# Patient Record
Sex: Female | Born: 1935 | Race: White | Hispanic: No | Marital: Married | Smoking: Never smoker
Health system: Southern US, Community
[De-identification: ages and names within clinical notes are randomized; demographics above are authoritative.]

## PROBLEM LIST (undated history)

## (undated) DIAGNOSIS — E785 Hyperlipidemia, unspecified: Secondary | ICD-10-CM

## (undated) DIAGNOSIS — I639 Cerebral infarction, unspecified: Secondary | ICD-10-CM

## (undated) DIAGNOSIS — R600 Localized edema: Secondary | ICD-10-CM

## (undated) DIAGNOSIS — K7469 Other cirrhosis of liver: Secondary | ICD-10-CM

## (undated) DIAGNOSIS — S72009A Fracture of unspecified part of neck of unspecified femur, initial encounter for closed fracture: Secondary | ICD-10-CM

## (undated) DIAGNOSIS — I2510 Atherosclerotic heart disease of native coronary artery without angina pectoris: Secondary | ICD-10-CM

## (undated) DIAGNOSIS — I10 Essential (primary) hypertension: Secondary | ICD-10-CM

## (undated) DIAGNOSIS — C50919 Malignant neoplasm of unspecified site of unspecified female breast: Secondary | ICD-10-CM

## (undated) DIAGNOSIS — J90 Pleural effusion, not elsewhere classified: Secondary | ICD-10-CM

## (undated) DIAGNOSIS — K922 Gastrointestinal hemorrhage, unspecified: Secondary | ICD-10-CM

## (undated) DIAGNOSIS — J189 Pneumonia, unspecified organism: Secondary | ICD-10-CM

## (undated) DIAGNOSIS — K219 Gastro-esophageal reflux disease without esophagitis: Secondary | ICD-10-CM

## (undated) DIAGNOSIS — K56609 Unspecified intestinal obstruction, unspecified as to partial versus complete obstruction: Secondary | ICD-10-CM

## (undated) DIAGNOSIS — E46 Unspecified protein-calorie malnutrition: Secondary | ICD-10-CM

## (undated) DIAGNOSIS — Z9981 Dependence on supplemental oxygen: Secondary | ICD-10-CM

## (undated) DIAGNOSIS — R609 Edema, unspecified: Secondary | ICD-10-CM

## (undated) DIAGNOSIS — R410 Disorientation, unspecified: Secondary | ICD-10-CM

## (undated) HISTORY — PX: CARDIAC CATHETERIZATION: SHX172

## (undated) HISTORY — DX: Hyperlipidemia, unspecified: E78.5

## (undated) HISTORY — PX: ABDOMINAL HYSTERECTOMY: SHX81

## (undated) HISTORY — PX: MASTECTOMY: SHX3

## (undated) HISTORY — DX: Cerebral infarction, unspecified: I63.9

## (undated) HISTORY — DX: Atherosclerotic heart disease of native coronary artery without angina pectoris: I25.10

## (undated) HISTORY — DX: Fracture of unspecified part of neck of unspecified femur, initial encounter for closed fracture: S72.009A

## (undated) HISTORY — DX: Gastro-esophageal reflux disease without esophagitis: K21.9

## (undated) HISTORY — DX: Essential (primary) hypertension: I10

---

## 1997-12-27 ENCOUNTER — Other Ambulatory Visit: Admission: RE | Admit: 1997-12-27 | Discharge: 1997-12-27 | Payer: Self-pay | Admitting: Family Medicine

## 1998-08-25 ENCOUNTER — Other Ambulatory Visit: Admission: RE | Admit: 1998-08-25 | Discharge: 1998-08-25 | Payer: Self-pay | Admitting: Family Medicine

## 1999-03-10 ENCOUNTER — Ambulatory Visit (HOSPITAL_COMMUNITY): Admission: RE | Admit: 1999-03-10 | Discharge: 1999-03-10 | Payer: Self-pay | Admitting: *Deleted

## 2000-05-03 ENCOUNTER — Other Ambulatory Visit: Admission: RE | Admit: 2000-05-03 | Discharge: 2000-05-03 | Payer: Self-pay | Admitting: Family Medicine

## 2001-01-03 ENCOUNTER — Ambulatory Visit (HOSPITAL_COMMUNITY): Admission: RE | Admit: 2001-01-03 | Discharge: 2001-01-03 | Payer: Self-pay | Admitting: *Deleted

## 2001-01-05 ENCOUNTER — Encounter: Payer: Self-pay | Admitting: *Deleted

## 2001-01-05 ENCOUNTER — Ambulatory Visit (HOSPITAL_COMMUNITY): Admission: RE | Admit: 2001-01-05 | Discharge: 2001-01-05 | Payer: Self-pay | Admitting: *Deleted

## 2001-01-13 ENCOUNTER — Encounter: Payer: Self-pay | Admitting: *Deleted

## 2001-01-13 ENCOUNTER — Ambulatory Visit (HOSPITAL_COMMUNITY): Admission: RE | Admit: 2001-01-13 | Discharge: 2001-01-13 | Payer: Self-pay | Admitting: *Deleted

## 2001-10-02 ENCOUNTER — Other Ambulatory Visit: Admission: RE | Admit: 2001-10-02 | Discharge: 2001-10-02 | Payer: Self-pay | Admitting: Family Medicine

## 2002-10-09 ENCOUNTER — Other Ambulatory Visit: Admission: RE | Admit: 2002-10-09 | Discharge: 2002-10-09 | Payer: Self-pay | Admitting: Family Medicine

## 2003-02-27 ENCOUNTER — Other Ambulatory Visit: Admission: RE | Admit: 2003-02-27 | Discharge: 2003-02-27 | Payer: Self-pay | Admitting: Gynecology

## 2003-03-25 ENCOUNTER — Inpatient Hospital Stay (HOSPITAL_COMMUNITY): Admission: EM | Admit: 2003-03-25 | Discharge: 2003-03-27 | Payer: Self-pay | Admitting: Emergency Medicine

## 2003-03-25 ENCOUNTER — Encounter: Payer: Self-pay | Admitting: Emergency Medicine

## 2003-03-29 ENCOUNTER — Encounter: Admission: RE | Admit: 2003-03-29 | Discharge: 2003-03-29 | Payer: Self-pay | Admitting: Family Medicine

## 2005-02-05 ENCOUNTER — Other Ambulatory Visit: Admission: RE | Admit: 2005-02-05 | Discharge: 2005-02-05 | Payer: Self-pay | Admitting: Family Medicine

## 2008-07-08 ENCOUNTER — Emergency Department (HOSPITAL_COMMUNITY): Admission: EM | Admit: 2008-07-08 | Discharge: 2008-07-08 | Payer: Self-pay | Admitting: Emergency Medicine

## 2008-12-10 DIAGNOSIS — S72009A Fracture of unspecified part of neck of unspecified femur, initial encounter for closed fracture: Secondary | ICD-10-CM

## 2008-12-10 HISTORY — DX: Fracture of unspecified part of neck of unspecified femur, initial encounter for closed fracture: S72.009A

## 2008-12-29 ENCOUNTER — Inpatient Hospital Stay (HOSPITAL_COMMUNITY): Admission: EM | Admit: 2008-12-29 | Discharge: 2009-01-02 | Payer: Self-pay | Admitting: Emergency Medicine

## 2008-12-29 ENCOUNTER — Ambulatory Visit: Payer: Self-pay | Admitting: *Deleted

## 2009-11-03 ENCOUNTER — Encounter: Payer: Self-pay | Admitting: Cardiology

## 2009-11-04 DIAGNOSIS — R079 Chest pain, unspecified: Secondary | ICD-10-CM | POA: Insufficient documentation

## 2009-11-04 DIAGNOSIS — R609 Edema, unspecified: Secondary | ICD-10-CM | POA: Insufficient documentation

## 2009-11-04 DIAGNOSIS — R0602 Shortness of breath: Secondary | ICD-10-CM

## 2009-11-06 ENCOUNTER — Ambulatory Visit: Payer: Self-pay | Admitting: Cardiology

## 2009-11-06 DIAGNOSIS — E785 Hyperlipidemia, unspecified: Secondary | ICD-10-CM

## 2009-11-07 ENCOUNTER — Ambulatory Visit: Payer: Self-pay | Admitting: Cardiology

## 2009-11-07 ENCOUNTER — Inpatient Hospital Stay: Payer: Self-pay | Admitting: Internal Medicine

## 2009-11-07 LAB — CONVERTED CEMR LAB
BUN: 15 mg/dL (ref 6–23)
Calcium: 8.4 mg/dL (ref 8.4–10.5)
Glucose, Bld: 96 mg/dL (ref 70–99)
Pro B Natriuretic peptide (BNP): 68.4 pg/mL (ref 0.0–100.0)

## 2009-11-10 ENCOUNTER — Encounter: Payer: Self-pay | Admitting: Cardiovascular Disease

## 2009-11-11 ENCOUNTER — Telehealth (INDEPENDENT_AMBULATORY_CARE_PROVIDER_SITE_OTHER): Payer: Self-pay

## 2009-11-17 ENCOUNTER — Ambulatory Visit: Payer: Self-pay | Admitting: Cardiovascular Disease

## 2009-11-17 DIAGNOSIS — R42 Dizziness and giddiness: Secondary | ICD-10-CM

## 2009-11-26 ENCOUNTER — Telehealth: Payer: Self-pay | Admitting: Cardiovascular Disease

## 2009-12-15 ENCOUNTER — Ambulatory Visit: Payer: Self-pay | Admitting: Cardiovascular Disease

## 2010-08-13 NOTE — Progress Notes (Signed)
Summary: regarding Myoview cancellation  Phone Note Outgoing Call Call back at Home Phone [phone]   Call placed by: Irean Hong, RN,  Nov 11, 2009 1:59 PM Summary of Call: The patient's husband called me today to let us know that the patient had an echo and medication type stress test in [facility] recently. Mercedes Valeriano,RN.

## 2010-08-13 NOTE — Assessment & Plan Note (Signed)
Summary: EPH/AMD   Referring Provider:  Dr. [name] Primary Provider:  Dr. [name]  CC:  Post Hosp F/U ([name]); C/O dizziness.  History of Present Illness: 77 yo with history of CAD s/p PCI in 1999 with peri-cath CVA presents for followup after a recent admission to [facility] for chest pain. She had a stress test at that time which showed ischemia in the inferior and inferolateral wall. She refused cardiac catheterization and has been treated medically.  Over the past several months, her Lasix dose has been increased for worsening shortness of breath.  Today she states that she has worsening dizziness since she left the hospital. She has been taking Lasix twice a day, lisinopril and Imdur. Her shortness of breath is okay today though seems to wax and wane. She has tried inhalers in the past and these do not seem to work very well. she had additional episodes of chest pain several days ago that she described as a squeezing in her chest. She had a positive stress test and does not want a cardiac catheterization.  We talked to her about her cholesterol as her LDL is greater than 150 and given her coronary disease this Should be less than 170   She is not taking ASA as she says that she "cannot take it" because even a baby aspirin makes her "run up the wall."     Problems Prior to Update: 1)  Hyperlipidemia-mixed  (ICD-272.4) 2)  Edema  (ICD-782.3) 3)  Dyspnea  (ICD-786.05) 4)  Chest Pain-unspecified  (ICD-786.50)  Medications Prior to Update: 1)  Omeprazole 20 Mg Cpdr (Omeprazole) .... Take 1 Tablet By Mouth Once Daily 2)  Lisinopril 10 Mg Tabs (Lisinopril) .... Take One Tablet By Mouth Daily 3)  Furosemide 40 Mg Tabs (Furosemide) .... Take One Tablet By Mouth Daily. 4)  Isosorbide Mononitrate Cr 30 Mg Xr24h-Tab (Isosorbide Mononitrate) .... Take 1 Tablet By Mouth Once Daily 5)  Nitrostat 0.4 Mg Subl (Nitroglycerin) .... Take As Directed 6)  Singulair  10 Mg Tabs (Montelukast Sodium) .... Take 1 Tablet By Mouth Once Daily 7)  Plavix 75 Mg Tabs (Clopidogrel Bisulfate) .... Take 4 Tablets X 1day Then Start Taking 1 Tablet Daily 8)  Coreg 6.25 Mg Tabs (Carvedilol) .... Take 1 Tablet By Mouth Two Times A Day  Current Medications (verified): 1)  Omeprazole 20 Mg Cpdr (Omeprazole) .... Take 1 Tablet By Mouth Two Times A Day 2)  Lisinopril 10 Mg Tabs (Lisinopril) .... Take One Tablet By Mouth Daily 3)  Furosemide 40 Mg Tabs (Furosemide) .... Take One Tablet By Mouth Daily. 4)  Isosorbide Mononitrate Cr 30 Mg Xr24h-Tab (Isosorbide Mononitrate) .... Take 1 Tablet By Mouth Once Daily 5)  Nitrostat 0.4 Mg Subl (Nitroglycerin) .... Take As Directed 6)  Singulair 10 Mg Tabs (Montelukast Sodium) .... Take 1 Tablet By Mouth Once Daily 7)  Plavix 75 Mg Tabs (Clopidogrel Bisulfate) .... Take 4 Tablets X 1day Then Start Taking 1 Tablet Daily 8)  Metoprolol Succinate 25 Mg Xr24h-Tab (Metoprolol Succinate) .... Take One Tablet By Mouth Daily  Allergies: 1)  ! Codeine  Past History:  Past Medical History: Last updated: 11/06/2009 1. CAD: States she had cath with PCI at [facility] in 1999 but no record in [place].  2. CVA: Had CVA peri-cath in 1999 with right-sided hemiparesis, now resolved.  3. GERD 4. HTN 5. Right femoral neck fracture s/p right hemiarthroplasty in 6/10 6. Asthma 7. Hyperlipidemia: No currently taking a statin.  Past Surgical History: Last updated: 11-25-09 Stent  Family History: Last updated: 2009/11/25 Father:Deceased in year 12/10/63 Mother:Deceased in year 82 Son has had 2 MIs  Social History: Last updated: 11/25/2009 Retired, lives in [place] Married  Tobacco Use - never.  Alcohol Use - no Regular Exercise - no Drug Use - no Caffeine drinks 3 daily  Risk Factors: Alcohol Use: 0 (11-25-2009) Caffeine Use: yes (11-25-09) Diet: no (Nov 25, 2009) Exercise: no (11/25/09)  Risk Factors: Smoking Status: never  (11/25/09)  Review of Systems       The patient complains of weight loss.  The patient denies fever, weight gain, vision loss, decreased hearing, hoarseness, chest pain, syncope, dyspnea on exertion, peripheral edema, prolonged cough, abdominal pain, incontinence, muscle weakness, depression, and enlarged lymph nodes.         Dizzy  Vital Signs:  Patient profile:   77 year old female Height:      62 inches Weight:      155 pounds BMI:     28.45 Temp:     98.0 degrees F oral Pulse rate:   75 / minute BP sitting:   100 / 44  (left arm) Cuff size:   regular  Vitals Entered By: Stanton Kidney, EMT-P (Nov 17, 2009 1:45 PM)  Physical Exam  General:  well-appearing woman in no apparent distress, HEENT exam is benign, oropharynx is clear, no JVP or carotid bruits, heart sounds are regular with S1-S2 and no murmurs appreciated, lungs are abdominal exam is benign, no significant lower extremity edema, neurologic exam is grossly nonfocal, skin is warm and dry. Pulses are equal and symmetrical in her upper and lower extremities.    EKG  Procedure date:  11/17/2009  Findings:      normal sinus rhythm with rate 75 beats per minute, no significant ST or T wave changes.  Impression & Recommendations:  Problem # 1:  DYSPNEA (ICD-786.05) dyspnea is improved today. Dr. Shirlee Latch thought that she looked euvolemic when she was in the hospital and recommended Lasix on a daily basis. She was discharged on Lasix b.i.d. and now is dizzy with hypotension.  Repeat check of her blood pressure confirmed systolic in the 90s.  Her updated medication list for this problem includes:    Lisinopril 10 Mg Tabs (Lisinopril) ..... Hold    Furosemide 40 Mg Tabs (Furosemide) .Marland Kitchen... Take one tablet by mouth daily.    Metoprolol Succinate 25 Mg Xr24h-tab (Metoprolol succinate) .Marland Kitchen... Take one tablet by mouth daily  Problem # 2:  DIZZINESS (ICD-780.4) Etiology of her dizziness is likely due to hypotension. We will  hold her Lasix for 3 days and then restart this at once a day instead of b.i.d. We will hold her lisinopril as her systolic pressure is in the 90s. We'll continue her on the Imdur given her continued episodes of chest pain.  Problem # 3:  CHEST PAIN-UNSPECIFIED (ICD-786.50) we have talked to her about her chest pain and encouraged her to take a nitroglycerin sublingual when she gets discomfort. I have suggested to her that if her pain becomes increasingly worse and more frequent, that she contact us and we consider a cardiac catheterization. Her updated medication list for this problem includes:    Lisinopril 10 Mg Tabs (Lisinopril) ..... Hold    Isosorbide Mononitrate Cr 30 Mg Xr24h-tab (Isosorbide mononitrate) .Marland Kitchen... Take 1 tablet by mouth once daily    Nitrostat 0.4 Mg Subl (Nitroglycerin) .Marland Kitchen... Take as directed    Plavix 75 Mg Tabs (Clopidogrel  bisulfate) .Marland Kitchen... Take 4 tablets x 1day then start taking 1 tablet daily    Metoprolol Succinate 25 Mg Xr24h-tab (Metoprolol succinate) .Marland Kitchen... Take one tablet by mouth daily  Problem # 4:  HYPERLIPIDEMIA-MIXED (ICD-272.4) her cholesterol is poorly controlled and I will start her on Crestor 5 mg daily. I've asked her to contact me if she is unable to tolerate it.  Her updated medication list for this problem includes:    Crestor 5 Mg Tabs (Rosuvastatin calcium) .Marland Kitchen... Take one tablet by mouth daily.  Patient Instructions: 1)  Your physician recommends that you schedule a follow-up appointment in: 1 month 2)  Your physician has recommended you make the following change in your medication: Stop your Lasix until Friday, then restart taking once daily.  Stop your Lisinopril.  Start taking Crestor 5mg  daily (1/2 10mg  tablet). 3)  Your physician has requested that you regularly monitor and record your blood pressure readings at home.  Please use the same machine at the same time of day to check your readings and record them. Call us in 1 week with your  readings.

## 2010-08-13 NOTE — Progress Notes (Signed)
Summary: PHI  PHI   Imported By: Harlon Flor 11/07/2009 11:34:23  _____________________________________________________________________  External Attachment:    Type:   Image     Comment:   External Document

## 2010-08-13 NOTE — Assessment & Plan Note (Signed)
Summary: NP6/AMD   Visit Type:  Initial Consult Referring Provider:  Dr. [name] Primary Provider:  Dr. [name]  CC:  CHF, CP, edema, SOB, cholesterol uncontrolable, and headache x 2wks.  History of Present Illness: 77 yo with history of CAD s/p PCI in 1999 with peri-cath CVA presents for evaluation of dyspnea and chest pain.  Patient has had some dyspnea for the last year.  She has attributed this to asthma.  However, for the last 2 months, this dyspnea has worsened.  She is now short of breath doing the laundary or just walking around her house.  She has 2 pillow orthopnea and occasional symptoms that sound like PND.  On Saturday, after eating a fried fish dinner, she developed a squeezing chest pain that did not feel like her past GERD.  She took NTG but this did not help much.  The pain lasted about 6 hours total.  Since then, she has been getting chest pressure and squeezing that seems to be happening mostly with exertion, such as walking around her house.  She has been taking about 2 nitroglycerines a day since Saturday.  She saw Dr. Clarene Duke on Monday who was concerned for CHF.  Her Lasix was increased from 10 mg daily to 40 mg two times a day.  She has been urinating frequently and says that her shortness of breath is improving.  She is not taking ASA as she says that she "cannot take it" because even a baby aspirin makes her "run up the wall."    ECG: NSR, nonspecific slight ST depression inferiorly and anteriolaterally, no Qs.   Preventive Screening-Counseling & Management  Alcohol-Tobacco     Alcohol drinks/day: 0     Smoking Status: never  Caffeine-Diet-Exercise     Caffeine use/day: yes     Caffeine Counseling: 3 times daily     Diet Comments: no     Does Patient Exercise: no      Drug Use:  no.    Current Medications (verified): 1)  Omeprazole 20 Mg Cpdr (Omeprazole) .... Take 1 Tablet By Mouth Once Daily 2)  Lisinopril 10 Mg Tabs (Lisinopril) .... Take One Tablet  By Mouth Daily 3)  Furosemide 40 Mg Tabs (Furosemide) .... Take One Tablet By Mouth Daily. 4)  Isosorbide Mononitrate Cr 30 Mg Xr24h-Tab (Isosorbide Mononitrate) .... Take 1 Tablet By Mouth Once Daily 5)  Nitrostat 0.4 Mg Subl (Nitroglycerin) .... Take As Directed 6)  Singulair 10 Mg Tabs (Montelukast Sodium) .... Take 1 Tablet By Mouth Once Daily  Allergies (verified): 1)  ! Codeine  Past History:  Past Medical History: 1. CAD: States she had cath with PCI at [facility] in 1999 but no record in [place].  2. CVA: Had CVA peri-cath in 1999 with right-sided hemiparesis, now resolved.  3. GERD 4. HTN 5. Right femoral neck fracture s/p right hemiarthroplasty in 6/10 6. Asthma 7. Hyperlipidemia: No currently taking a statin.   Past Surgical History: Stent  Family History: Father:Deceased in year 37 Mother:Deceased in year 48 Son has had 2 MIs  Social History: Retired, lives in [place] Married  Tobacco Use - never.  Alcohol Use - no Regular Exercise - no Drug Use - no Caffeine drinks 3 dailySmoking Status:  never Does Patient Exercise:  no Drug Use:  no Alcohol drinks/day:  0 Caffeine use/day:  yes Diet Comments:  no  Review of Systems       All systems reviewed and negative except as per HPI.  Vital Signs:  Patient profile:   77 year old female Height:      62 inches Weight:      156 pounds BMI:     28.64 Pulse rate:   94 / minute BP sitting:   138 / 62  (right arm) Cuff size:   large CC: CHF, CP, edema, SOB, cholesterol uncontrolable, headache x 2wks   Physical Exam  General:  Well developed, well nourished, in no acute distress. Obese.  Head:  normocephalic and atraumatic Nose:  no deformity, discharge, inflammation, or lesions Mouth:  Teeth, gums and palate normal. Oral mucosa normal. Neck:  Neck supple, JVP 8 cm. No masses, thyromegaly or abnormal cervical nodes. Lungs:  Crackles 1/3 up lung Mehlman bilaterally.  Heart:  Non-displaced PMI, chest  non-tender; regular rate and rhythm, S1, S2 without rubs or gallops. 2/6 systolic crescendo-decrescend murmur RUSB.   Carotid upstroke normal, no bruit. 1+ ankle edema.  Abdomen:  Bowel sounds positive; abdomen soft and non-tender without masses, organomegaly, or hernias noted. No hepatosplenomegaly. Msk:  Back normal, normal gait. Muscle strength and tone normal. Extremities:  No clubbing or cyanosis. Neurologic:  Alert and oriented x 3. Skin:  Intact without lesions or rashes. Psych:  Normal affect.   Impression & Recommendations:  Problem # 1:  DYSPNEA (ICD-786.05) Patient has had the gradual progression of exertional dyspnea.  She does appear mildly volume overloaded on exam.  She says that her symptoms have improved since Lasix was increased on Monday.  I suspect that she has CHF.  Will get CXR today and labs (BMET, BNP).  She may need the addition of KCl while on the Lasix (will f/u BMET).  Echocardiogram on Monday.   Problem # 2:  CHEST PAIN-UNSPECIFIED (ICD-786.50) Patient had a prolonged episode of chest discomfort after eating fried fish on Saturday.  However, since that time, she has had several episodes of exertional chest pain for which she has been taking NTG.  This does sound worrisome for progressive CAD.  I talked to her about catheterization, which she is very reluctant to undergo given her prior peri-cath CVA.  We will therefore start out with a Lexiscan myoview which should be done Monday.  If there is a large area of ischemia, I will likely recommend that she have a catheterization.  She says that she cannot tolerate aspirin.  I will have her start Plavix. She will continue Imdur and start on Coreg 6.25 mg two times a day.    Problem # 3:  HYPERLIPIDEMIA-MIXED (ICD-272.4) I will check fasting lipids.  She needs to be on a statin, the question will be what dose.  She is not on one currently but does not remember any past side effects.    Other Orders: T-2 View CXR  (71020TC) T-Basic Metabolic Panel [phone]) T-BNP  (B Natriuretic Peptide) [phone]) Echocardiogram (Echo) Nuclear Stress Test (Nuc Stress Test)  Patient Instructions: 1)  Your physician recommends that you schedule a follow-up appointment in: 1 week 2)  Your physician recommends that you return for a FASTING lipid profile: on Monday at [facility] office. 3)  Your physician has recommended you make the following change in your medication: Start taking Plavix 75mg , take 300mg  x 1 dose then start taking 75mg  daily. Start taking Coreg 6.25mg  two times a day.   4)  Your physician has requested that you have an [employer]  For further information please visit [email or web address]  Please follow instruction sheet, as given. 5)  Your physician  has requested that you have an echocardiogram.  Echocardiography is a painless test that uses sound waves to create images of your heart. It provides your doctor with information about the size and shape of your heart and how well your heart's chambers and valves are working.  This procedure takes approximately one hour. There are no restrictions for this procedure. Prescriptions: COREG 6.25 MG TABS (CARVEDILOL) Take 1 tablet by mouth two times a day  #60 x 6   Entered by:   Cloyde Reams RN   Authorized by:   Marca Ancona, MD   Signed by:   Cloyde Reams RN on 11/06/2009   Method used:   Electronically to        [facility]* (retail)       [address] [place], [place]  [address]       Ph: [phone] or [phone]       Fax: [phone]   RxID:   [id number] PLAVIX 75 MG TABS (CLOPIDOGREL BISULFATE) Take 4 tablets x 1day then start taking 1 tablet daily  #34 x 0   Entered by:   Cloyde Reams RN   Authorized by:   Marca Ancona, MD   Signed by:   Cloyde Reams RN on 11/06/2009   Method used:   Electronically to        [facility]* (retail)       [address] [place], [place]  [address]       Ph: [phone] or [phone]       Fax: [phone]   RxID:   [id number]

## 2010-08-13 NOTE — Assessment & Plan Note (Signed)
Summary: F1M/GLC   Visit Type:  Follow-up Referring Provider:  Dr. [name] Primary Provider:  Dr. [name]  CC:  chest pain;relieved with (2)NTG...dizziness.  History of Present Illness: 77 yo with history of CAD s/p PCI in Nov 22, 1997 with peri-cath CVA presents for followup after a recent admission to [facility] for chest pain. She had a stress test at that time which showed ischemia in the inferior and inferolateral wall. She refused cardiac catheterization and has been treated medically.  Over the past several months, her Lasix dose has been increased for worsening shortness of breath.She states that she feels better on Lasix 40 mg daily. She has no significant edema. She continues to have chest discomfort and upper back discomfort in the evenings when she goes to bed. She would like a sleeping pill as she reports Ambien and Xanax worked well in the hospital.she is uncertain if her discomfort is due to heartburn. She was prescribed to omeprazole daily though she is only taking one.  Blood pressure numbers taken at home show systolic pressures in the 120s, heart rates in the 60s. Repeat blood pressure check in the office shows 135/60.     She had a positive stress test and does not want a cardiac catheterization.   She is not taking ASA as she says that she "cannot take it" because even a baby aspirin makes her "run up the wall."     Current Medications (verified): 1)  Omeprazole 20 Mg Cpdr (Omeprazole) .... Take 1 Tablet By Mouth Two Times A Day 2)  Furosemide 40 Mg Tabs (Furosemide) .... Take One Tablet By Mouth Daily. 3)  Nitrostat 0.4 Mg Subl (Nitroglycerin) .... Take As Directed 4)  Singulair 10 Mg Tabs (Montelukast Sodium) .... Take 1 Tablet By Mouth Once Daily 5)  Plavix 75 Mg Tabs (Clopidogrel Bisulfate) .... Take 4 Tablets X 1day Then Start Taking 1 Tablet Daily  Allergies (verified): 1)  ! Codeine  Past History:  Past Medical History: Last  updated: 2009/11/08 1. CAD: States she had cath with PCI at [facility] in 1997/11/22 but no record in [place].  2. CVA: Had CVA peri-cath in Nov 22, 1997 with right-sided hemiparesis, now resolved.  3. GERD 4. HTN 5. Right femoral neck fracture s/p right hemiarthroplasty in 6/10 6. Asthma 7. Hyperlipidemia: No currently taking a statin.   Past Surgical History: Last updated: November 08, 2009 Stent  Family History: Last updated: 2009/11/08 Father:Deceased in year 23-Nov-1963 Mother:Deceased in year 68 Son has had 2 MIs  Social History: Last updated: 08-Nov-2009 Retired, lives in [place] Married  Tobacco Use - never.  Alcohol Use - no Regular Exercise - no Drug Use - no Caffeine drinks 3 daily  Risk Factors: Alcohol Use: 0 (11/08/09) Caffeine Use: yes (11-08-09) Diet: no (November 08, 2009) Exercise: no (11-08-2009)  Risk Factors: Smoking Status: never (2009/11/08)  Review of Systems       The patient complains of chest pain and dyspnea on exertion.  The patient denies fever, weight loss, weight gain, vision loss, decreased hearing, hoarseness, syncope, peripheral edema, prolonged cough, abdominal pain, incontinence, muscle weakness, depression, and enlarged lymph nodes.    Vital Signs:  Patient profile:   77 year old female Height:      62 inches Weight:      154 pounds BMI:     28.27 Pulse rate:   92 / minute BP sitting:   174 / 75  (left arm) Cuff size:   regular  Vitals Entered By: Bishop Dublin,  CMA (December 15, 2009 10:48 AM)  Physical Exam  General:  Well developed, well nourished, in no acute distress. Head:  normocephalic and atraumatic Neck:  Neck supple, no JVD. No masses, thyromegaly or abnormal cervical nodes. Lungs:  scattered rhonchi bilaterally at the bases. Heart:  Non-displaced PMI, chest non-tender; regular rate and rhythm, S1, S2 without murmurs, rubs or gallops. Carotid upstroke normal, no bruit.. Pedals normal pulses. No edema, no varicosities. Abdomen:  Bowel sounds  positive; abdomen soft and non-tender without masses,  Msk:  Back normal, normal gait. Muscle strength and tone normal. Pulses:  pulses normal in all 4 extremities Extremities:  No clubbing or cyanosis. Neurologic:  Alert and oriented x 3. Skin:  Intact without lesions or rashes. Psych:  Normal affect.    EKG  Procedure date:  12/15/2009  Findings:      normal sinus rhythm with rate 92 beats per minute, nonspecific ST changes in anterolateral leads, inferior leads.  Impression & Recommendations:  Problem # 1:  DIZZINESS (ICD-780.4) her dizziness has improved. She ran out of Imdur and metoprolol and has been off these for one week. We will restart the metoprolol and continue to hold the Imdur as her blood pressure numbers appear to be well controlled at home.  Problem # 2:  HYPERLIPIDEMIA-MIXED (ICD-272.4) she could not tolerate Crestor and has given [place] the samples back. She does not want to try any more statins.  The following medications were removed from the medication list:    Crestor 5 Mg Tabs (Rosuvastatin calcium) .Marland Kitchen... Take one tablet by mouth daily.  Problem # 3:  EDEMA (ICD-782.3) Edema and shortness of breath appears to be relatively well controlled on her current dose of Lasix 40 mg daily.  Problem # 4:  CHEST PAIN-UNSPECIFIED (ICD-786.50) chest pain is being managed by sublingual nitros. It seems to happen at night time, is somewhat atypical. Her back and chest discomfort may also be due to some shortness of breath, asthma, arthritis. We will have her continue to take sublingual nitroglycerin for now. We will hold the long-acting nitroglycerin as her blood pressure has been low on the medication and she's also had dizziness on the medication.  The following medications were removed from the medication list:    Lisinopril 10 Mg Tabs (Lisinopril) ..... Hold    Isosorbide Mononitrate Cr 30 Mg Xr24h-tab (Isosorbide mononitrate) .Marland Kitchen... Take 1 tablet by mouth once  daily Her updated medication list for this problem includes:    Nitrostat 0.4 Mg Subl (Nitroglycerin) .Marland Kitchen... Take as directed    Plavix 75 Mg Tabs (Clopidogrel bisulfate) .Marland Kitchen... Take 1 tablet by mouth once a day    Metoprolol Succinate 25 Mg Xr24h-tab (Metoprolol succinate) .Marland Kitchen... Take one tablet by mouth daily  Patient Instructions: 1)  Your physician has recommended you make the following change in your medication: Hold imdur refills sent to your pharmacy  2)  Your physician wants you to follow-up in:   6 months You will receive a reminder letter in the mail two months in advance. If you don't receive a letter, please call our office to schedule the follow-up appointment. Prescriptions: FUROSEMIDE 40 MG TABS (FUROSEMIDE) Take one tablet by mouth daily.  #90 x 4   Entered by:   Benedict Needy, RN   Authorized by:   Dossie Arbour MD   Signed by:   Benedict Needy, RN on 12/15/2009   Method used:   Electronically to        [facility]* (retail)       [address] [place], [place]  [address]       Ph: [phone] or [phone]       Fax: [phone]   RxID:   [id number] PLAVIX 75 MG TABS (CLOPIDOGREL BISULFATE) Take 1 tablet by mouth once a day  #90 x 4   Entered by:   Benedict Needy, RN   Authorized by:   Dossie Arbour MD   Signed by:   Benedict Needy, RN on 12/15/2009   Method used:   Electronically to        [facility]* (retail)       [address] [place], [place]  [address]       Ph: [phone] or [phone]       Fax: [phone]   RxID:   [id number] METOPROLOL SUCCINATE 25 MG XR24H-TAB (METOPROLOL SUCCINATE) Take one tablet by mouth daily  #90 x 4   Entered by:   Benedict Needy, RN   Authorized by:   Dossie Arbour MD   Signed by:   Benedict Needy, RN on 12/15/2009   Method used:   Electronically to        [facility]*  (retail)       [address] [place], [place]  [address]       Ph: [phone] or [phone]       Fax: [phone]   RxID:   [id number]

## 2010-08-13 NOTE — Progress Notes (Signed)
Summary: BP READINGS  Phone Note Call from Patient Call back at Home Phone [phone]   Caller: SELF Call For: [facility] Summary of Call: BP READINGS: 5/10 WAS 116/93, 5/11 WAS 122/60, 5/12 WAS 138/65, 5/13 WAS 125/58, 5/14 WAS 122/57, 5/15 WAS 125/63, 5/16 WAS 122/65.  THESE WERE TAKEN BETWEEN 7 AND 8:30 PM.  PT HAS DISCONTIUED THE CRESTOR BECAUSE SHE WAS UNABLE TO GET OUT OF THE BED WHILE TAKING IT. Initial call taken by: Harlon Flor,  Nov 26, 2009 9:38 AM  Follow-up for Phone Call        Called spoke with pt advised BP's look very good, continue on same medications.  Pt states she tried the Crestor again, and "couldn't get out of bed", legs ached, body aches.  Pt states she has tried taking in the past with the same intolerance.   Follow-up by: Cloyde Reams RN,  Nov 26, 2009 10:30 AM

## 2010-08-13 NOTE — Consult Note (Signed)
[facility]   Imported By: Harlon Flor 11/07/2009 11:20:38  _____________________________________________________________________  External Attachment:    Type:   Image     Comment:   External Document

## 2010-08-13 NOTE — Letter (Signed)
[facility]   Imported By: Harlon Flor 11/13/2009 11:08:03  _____________________________________________________________________  External Attachment:    Type:   Image     Comment:   External Document

## 2010-09-10 DIAGNOSIS — K922 Gastrointestinal hemorrhage, unspecified: Secondary | ICD-10-CM

## 2010-09-10 HISTORY — DX: Gastrointestinal hemorrhage, unspecified: K92.2

## 2010-09-16 ENCOUNTER — Inpatient Hospital Stay (HOSPITAL_COMMUNITY)
Admission: EM | Admit: 2010-09-16 | Discharge: 2010-09-18 | DRG: 378 | Disposition: A | Discharge status: Home / Self Care | Payer: Medicare Other | Attending: Family Medicine | Admitting: Family Medicine

## 2010-09-16 ENCOUNTER — Other Ambulatory Visit: Payer: Self-pay | Admitting: Gastroenterology

## 2010-09-16 ENCOUNTER — Emergency Department (HOSPITAL_COMMUNITY): Payer: Medicare Other

## 2010-09-16 DIAGNOSIS — R0789 Other chest pain: Secondary | ICD-10-CM | POA: Diagnosis present

## 2010-09-16 DIAGNOSIS — I69959 Hemiplegia and hemiparesis following unspecified cerebrovascular disease affecting unspecified side: Secondary | ICD-10-CM

## 2010-09-16 DIAGNOSIS — K922 Gastrointestinal hemorrhage, unspecified: Principal | ICD-10-CM | POA: Diagnosis present

## 2010-09-16 DIAGNOSIS — D62 Acute posthemorrhagic anemia: Secondary | ICD-10-CM

## 2010-09-16 DIAGNOSIS — D1330 Benign neoplasm of unspecified part of small intestine: Secondary | ICD-10-CM | POA: Diagnosis present

## 2010-09-16 DIAGNOSIS — I2510 Atherosclerotic heart disease of native coronary artery without angina pectoris: Secondary | ICD-10-CM | POA: Diagnosis present

## 2010-09-16 DIAGNOSIS — K219 Gastro-esophageal reflux disease without esophagitis: Secondary | ICD-10-CM | POA: Diagnosis present

## 2010-09-16 DIAGNOSIS — K921 Melena: Secondary | ICD-10-CM

## 2010-09-16 DIAGNOSIS — K222 Esophageal obstruction: Secondary | ICD-10-CM | POA: Diagnosis present

## 2010-09-16 LAB — CBC
Hemoglobin: 6.3 g/dL — CL (ref 12.0–15.0)
MCHC: 31.3 g/dL (ref 30.0–36.0)
MCV: 67.5 fL — ABNORMAL LOW (ref 78.0–100.0)
Platelets: 159 10*3/uL (ref 150–400)
RBC: 3.18 MIL/uL — ABNORMAL LOW (ref 3.87–5.11)
RDW: 20.3 % — ABNORMAL HIGH (ref 11.5–15.5)
WBC: 9.4 10*3/uL (ref 4.0–10.5)
WBC: 6 10*3/uL (ref 4.0–10.5)

## 2010-09-16 LAB — POCT I-STAT, CHEM 8
BUN: 28 mg/dL — ABNORMAL HIGH (ref 6–23)
Calcium, Ion: 1.09 mmol/L — ABNORMAL LOW (ref 1.12–1.32)
HCT: 20 % — ABNORMAL LOW (ref 36.0–46.0)
TCO2: 20 mmol/L (ref 0–100)

## 2010-09-16 LAB — OCCULT BLOOD, POC DEVICE: Fecal Occult Bld: POSITIVE

## 2010-09-16 LAB — MRSA PCR SCREENING: MRSA by PCR: NEGATIVE

## 2010-09-16 LAB — GLUCOSE, CAPILLARY: Glucose-Capillary: 127 mg/dL — ABNORMAL HIGH (ref 70–99)

## 2010-09-16 LAB — DIFFERENTIAL
Basophils Absolute: 0.1 10*3/uL (ref 0.0–0.1)
Basophils Relative: 1 % (ref 0–1)
Neutro Abs: 7.1 10*3/uL (ref 1.7–7.7)
Neutrophils Relative %: 76 % (ref 43–77)

## 2010-09-16 LAB — PROTIME-INR
INR: 1.37 (ref 0.00–1.49)
Prothrombin Time: 17.1 seconds — ABNORMAL HIGH (ref 11.6–15.2)

## 2010-09-16 LAB — PREPARE RBC (CROSSMATCH)

## 2010-09-16 LAB — POCT CARDIAC MARKERS: Troponin i, poc: <0.05 ng/mL (ref 0.00–0.09)

## 2010-09-17 DIAGNOSIS — D1330 Benign neoplasm of unspecified part of small intestine: Secondary | ICD-10-CM

## 2010-09-17 DIAGNOSIS — K921 Melena: Secondary | ICD-10-CM

## 2010-09-17 LAB — CROSSMATCH
ABO/RH(D): A POS
Antibody Screen: NEGATIVE
Unit division: 0

## 2010-09-17 LAB — CARDIAC PANEL(CRET KIN+CKTOT+MB+TROPI)
CK, MB: 1.3 ng/mL (ref 0.3–4.0)
Relative Index: INVALID (ref 0.0–2.5)
Total CK: 46 U/L (ref 7–177)

## 2010-09-17 LAB — BASIC METABOLIC PANEL
CO2: 27 mEq/L (ref 19–32)
Calcium: 7.7 mg/dL — ABNORMAL LOW (ref 8.4–10.5)
Chloride: 105 mEq/L (ref 96–112)
GFR calc Af Amer: >60 mL/min (ref >60)
Glucose, Bld: 124 mg/dL — ABNORMAL HIGH (ref 70–99)
Potassium: 3.2 mEq/L — ABNORMAL LOW (ref 3.5–5.1)
Sodium: 138 mEq/L (ref 135–145)

## 2010-09-17 LAB — CBC
HCT: 30.7 % — ABNORMAL LOW (ref 36.0–46.0)
HCT: 31.9 % — ABNORMAL LOW (ref 36.0–46.0)
Hemoglobin: 10.6 g/dL — ABNORMAL LOW (ref 12.0–15.0)
MCH: 23.2 pg — ABNORMAL LOW (ref 26.0–34.0)
MCHC: 33.2 g/dL (ref 30.0–36.0)
MCV: 70 fL — ABNORMAL LOW (ref 78.0–100.0)
MCV: 70.3 fL — ABNORMAL LOW (ref 78.0–100.0)
Platelets: 151 10*3/uL (ref 150–400)
Platelets: 166 10*3/uL (ref 150–400)
Platelets: 178 10*3/uL (ref 150–400)
RBC: 4.35 MIL/uL (ref 3.87–5.11)
RBC: 4.56 MIL/uL (ref 3.87–5.11)
RDW: 20.8 % — ABNORMAL HIGH (ref 11.5–15.5)
RDW: 21 % — ABNORMAL HIGH (ref 11.5–15.5)
RDW: 21.1 % — ABNORMAL HIGH (ref 11.5–15.5)
WBC: 5.7 10*3/uL (ref 4.0–10.5)
WBC: 6 10*3/uL (ref 4.0–10.5)
WBC: 7.1 10*3/uL (ref 4.0–10.5)

## 2010-09-17 LAB — TSH: TSH: 2.672 u[IU]/mL (ref 0.350–4.500)

## 2010-09-18 LAB — CBC
HCT: 31.6 % — ABNORMAL LOW (ref 36.0–46.0)
Hemoglobin: 10.2 g/dL — ABNORMAL LOW (ref 12.0–15.0)
MCHC: 32.3 g/dL (ref 30.0–36.0)
RDW: 22.1 % — ABNORMAL HIGH (ref 11.5–15.5)
WBC: 5.2 10*3/uL (ref 4.0–10.5)

## 2010-09-18 LAB — CARDIAC PANEL(CRET KIN+CKTOT+MB+TROPI)
Relative Index: INVALID (ref 0.0–2.5)
Total CK: 37 U/L (ref 7–177)
Troponin I: 0.02 ng/mL (ref 0.00–0.06)

## 2010-09-21 NOTE — Discharge Summary (Signed)
NAMETINIA, ORAVEC                ACCOUNT NO.:  [id number]  MEDICAL RECORD NO.:  [id number]           PATIENT TYPE:  I  LOCATION:  2010                         FACILITY:  [facility]  PHYSICIAN:  Brendia Sacks, MD    DATE OF BIRTH:  1929-03-06  DATE OF ADMISSION:  09/16/2010 DATE OF DISCHARGE:  09/18/2010                              DISCHARGE SUMMARY   PRIMARY CARE PHYSICIAN:  Dr. Clarene Duke at [facility].  PRIMARY CARDIOLOGIST:  Antonieta Iba, MD  CONDITION ON DISCHARGE:  Improved.  DISCHARGE DIAGNOSES: 1. Upper gastrointestinal bleed secondary to sessile polyp in the     duodenal bulb. 2. Status post band ligation of the said polyp. 3. Stricture at the gastroesophageal junction. 4. Acute blood loss anemia, status post 4 units of packed red blood     cells. 5. Noncardiac chest pain, resolved. 6. History of coronary artery disease, stable. 7. History of cerebrovascular accident, stable. 8. Gastroesophageal reflux disease, stable.  HISTORY OF PRESENT ILLNESS:  This is a 77 year old woman who presented to the emergency room with chest pain and was found to be profoundly anemic.  She is admitted for transfusion and gastroenterology evaluation.  HOSPITAL COURSE: 1. Upper GI bleed:  The patient was seen in consultation with     Gastroenterology.  She was taken to the Endoscopy Suite and     underwent EGD with results as described below.  Since her     procedure, she has had no further bleeding.  She is status post 4     units of packed red blood cells with adequate response.  Her     hemoglobin has remained stable.  She feels quite well to go home     and has had no further bleeding.  She has been cleared from a GI     standpoint and will go home on iron.  Per GI, Plavix can be resumed     on September 25, 2010, as her hemoglobin is stable and she has had no     further bleeding.  The patient will be following up with her     primary care physician 2 days prior to this. 2.  Acute blood loss anemia secondary to above. 3. Noncardiac chest pain.  This resolved and was secondary to the     patient's severe anemia. 4. History of coronary artery disease.  This appears to be stable.     The patient's chest pain was related to her anemia and not cardiac     in nature per se.  She has declined catheterization in the past, we     will continue on medical management.  I do not see that she is on     any beta-blocker therapy or aspirin therapy or statin therapy.  She     is followed by her cardiologist and therefore at this point I would     make no changes, but would defer to Cardiology's recommendations.     In regard to her Plavix, this is on hold secondary to her GI bleed. 5. History of CVA.  This appears to  be stable and she can resume her     Plavix as described above. 6. Reflux:  This has remained stable on PPI therapy.  CONSULTATIONS:  Gastroenterology.  RECOMMENDATIONS:  As above.  PROCEDURES:  Upper endoscopy on September 16, 2010:  Sessile polyp in the duodenal bulb, intermittently actively bleeding.  Status post band ligation.  Stricture at the gastroesophageal junction.  Polyps multiple in the fundus.  PATHOLOGY:  Duodenum biopsy notable for hyperplastic-type polyp, no evidence of malignancy.  IMAGING:  Chest x-ray on September 16, 2010:  No active disease.  PERTINENT LABORATORY STUDIES: 1. Hemoglobin on admission was 6.3, on discharge 10.2 and stable.     White blood cell count and platelet count within normal limits. 2. Cardiac enzymes negative. 3. Basic metabolic panel essentially unremarkable. 4. TSH within normal limits.  PHYSICAL EXAMINATION ON DISCHARGE:  GENERAL:  The patient is feeling well.  She has had no bleeding.  She is ready to go home. VITAL SIGNS:  Afebrile, temperature is 97.7.  Pulse listed as 43, however, this is not consistent with my examination with normal heart rate approximately in the 70s.  Respirations 18.  Blood pressure  126/60. Sat 93% on room air. CARDIOVASCULAR:  Regular rate and rhythm.  No murmur, rub, or gallop. RESPIRATORY:  Clear to auscultation bilaterally.  No wheezes, rales, or rhonchi.  Normal respiratory effort. ABDOMEN:  Soft, nontender, and nondistended.  DISCHARGE INSTRUCTIONS:  The patient will be discharged home today.  I have discussed discharge instructions with her family including her sister, brother-in-law, and husband and they voiced understanding.  ACTIVITIES:  Unrestricted.  DIET:  Unrestricted.  FOLLOWUP:  She should follow up with Dr. Clarene Duke at her already scheduled appointment on September 23, 2010, and have a CBC checked at that time.  If her hemoglobin remains stable, then Gastroenterology has recommended restarting her Plavix on September 25, 2010.  We would defer reinitiation of this to her primary care physician.  DISCHARGE MEDICATIONS: 1. Tylenol 325 mg 2 tablets p.o. every 4 hours as needed for pain. 2. Iron 325 mg p.o. daily. 3. Amlodipine 5 mg p.o. daily. 4. Melatonin over-the-counter p.o. nightly. 5. Nitroglycerin sublingual 0.4 mg p.r.n. chest pain every 5 minutes     as needed up to 3 doses.  Call 911 if no relief. 6. Omeprazole 20 mg p.o. daily. 7. Singular 10 mg p.o. daily.  Discontinue the following medications: 1. Plavix. 2. Prednisone.  I have also discussed with the family absolutely no NSAIDS at this point.  TIME COORDINATING DISCHARGE:  15 minutes.     Brendia Sacks, MD     DG/MEDQ  D:  09/18/2010  T:  09/19/2010  Job:  [id number]  cc:   Dr. San Jetty, MD  Electronically Signed by Brendia Sacks  on 09/21/2010 08:33:19 PM

## 2010-09-22 NOTE — Procedures (Addendum)
Summary: Upper Endoscopy  Patient: [name] Note: All result statuses are Final unless otherwise noted.  Tests: (1) Upper Endoscopy (EGD)   EGD Upper Endoscopy       DONE     [facility]     [address]     [place], [place]  [address]          ENDOSCOPY PROCEDURE REPORT          PATIENT:  [name], [name]  MR#:  [id number]     BIRTHDATE:  07-22-1928, 81 yrs. old  GENDER:  female          ENDOSCOPIST:  Barbette Hair. Arlyce Dice, MD     Referred by:          PROCEDURE DATE:  09/16/2010     PROCEDURE:  EGD with biopsy, 43239, EGD for control of bleeding     ASA CLASS:  Class III     INDICATIONS:  melena          MEDICATIONS:   Fentanyl 100.9 mcg, Versed 3 mg, glycopyrrolate     (Robinal) 0.2 mg IV     TOPICAL ANESTHETIC:          DESCRIPTION OF PROCEDURE:   After the risks benefits and     alternatives of the procedure were thoroughly explained, informed     consent was obtained.  The [id number] ([id number]) and [id number]     ([id number]) endoscope was introduced through the mouth and advanced     to the third portion of the duodenum, without limitations.  The     instrument was slowly withdrawn as the mucosa was fully examined.     <<PROCEDUREIMAGES>>          A sessile polyp was found in the duodenal bulb portion of the     duodenum. Intermittently actively bleeding friable polyp at apex     of bulb. Area examined with gastroscope and duodenoscope. After     single biopsy the polyp developed more active bleeding. Bleeding     stopped after application of a single band ligator (see image003,     image012, image013, and image016).  A stricture was found at the     gastroesophageal junction (see image001 and image009).  There were     multiple polyps identified. in the fundus (see image005 and     image006). Multiple benign appearing fundic polyps    Retroflexion     was not performed.  The scope was then withdrawn from the patient     and the procedure  completed.          COMPLICATIONS:  None          ENDOSCOPIC IMPRESSION:     1) Sessile polyp in the duodenal bulb - intermittently actively     bleeding; s/p band ligation     2) Stricture at the gastroesophageal junction     3) Polyps, multiple in the fundus     RECOMMENDATIONS:     1) Await biopsy results     2) continue PPI          REPEAT EXAM:  No          ______________________________     Barbette Hair. Arlyce Dice, MD          CC:          n.     eSIGNED:   Barbette Hair. Marcia Lepera at 09/16/2010 12:21 PM  [name], [name], [phone]  Note: An exclamation mark (!) indicates a result that was not dispersed into the flowsheet. Document Creation Date: 09/16/2010 12:22 PM _______________________________________________________________________  (1) Order result status: Final Collection or observation date-time: 09/16/2010 12:14 Requested date-time:  Receipt date-time:  Reported date-time:  Referring Physician:   Ordering Physician: Melvia Heaps [phone]) Specimen Source:  Source: Launa Grill Order Number: [id number] Lab site:

## 2010-10-01 NOTE — H&P (Signed)
[name], [name]                ACCOUNT NO.:  [id number]  MEDICAL RECORD NO.:  [id number]           PATIENT TYPE:  E  LOCATION:  [facility]                         FACILITY:  [facility]  PHYSICIAN:  Houston Siren, MD           DATE OF BIRTH:  1928-12-19  DATE OF ADMISSION:  09/16/2010 DATE OF DISCHARGE:                             HISTORY & PHYSICAL   PRIMARY CARE PHYSICIAN:  Dr. Clarene Duke of [facility].  CARDIOLOGIST:  Antonieta Iba, MD  ADVANCE DIRECTIVE:  Full code.  REASON FOR ADMISSION:  Chest pain, found to be profoundly anemic.  HISTORY OF PRESENT ILLNESS:  This is a 77 year old female with history of coronary artery disease, prior CVA with left hemiparesis, emphysema, GERD, status post open reduction and internal fixation for his right proximal humerus fracture, presents to the emergency room today with several hours  of substernal chest pain and feeling weak.  She also admitted to having melanotic stool for the past few days.  She denied nausea, vomiting, or any abdominal pain.  She has been on Plavix and reportedly prednisone as well.  Evaluation in the emergency room confirmed guaiac-positive stool with a hemoglobin of 6.3, INR of 1.37. She did have elevation of her BUN to 28 with normal creatinine of 1.09. Her EKG shows T-wave inversions, but it is not new.  Her BNP is 267, and her cardiac enzymes were negative.  Hospitalist was asked to admit the patient for further evaluation and treatment of her profound anemia, felt to be from an upper GI bleed.  PAST MEDICAL HISTORY: 1. Coronary artery disease. 2. Prior CVA with left hemiparesis. 3. Emphysema. 4. GERD. 5. Status post ORIF. 6. Previous anemia from a blood loss.  EGD and colonoscopy have not     been done for many years, at least over 5.  ALLERGIES:  CODEINE.  CURRENT MEDICATIONS:  Norvasc, melatonin, Prilosec, prednisone, Singulair.  FAMILY HISTORY:  Noncontributory.  REVIEW OF SYSTEMS:  Otherwise  unremarkable.  She does feel cold in her feet and weak but is having no active chest pain at this time.  PHYSICAL EXAMINATION:  VITAL SIGNS:  Blood pressure 110/50, pulse of 90, respiratory rate 20, temperature 98.1. GENERAL:  She does appear pale. HEENT:  Conjunctivae are pale as well.  Sclerae are nonicteric. NECK:  Supple.  She is hard-of-hearing.  No carotid bruit. CARDIAC:  S1 and S2, regular.  There is a flow murmur of 2-3/6 at the left sternal border. LUNGS:  No crackle, wheezes, or any evidence of consolidation. ABDOMEN:  Soft, nondistended, nontender. EXTREMITIES: dermatoglyphic  lines on her both hands are bland.  She has no edema, no calf tenderness.  No peripheral nor central cyanosis. SKIN:  Warm and dry. NEUROLOGIC AND PSYCHIATRIC:  Unremarkable as well.  Her left side is slightly weaker.  LABORATORY STUDY:  Creatinine 0.8, BUN of 28, potassium 3.6.  Hemoglobin of 6.3, white count of 9.4 thousand, MCV of 63, platelet count 225,000. Chest x-ray showed cardiomegaly and chronic COPD changes. INR of 1.37.  IMPRESSION:  This is a 77 year old female with history  of coronary artery disease, chronic obstructive pulmonary disease, on Plavix and prednisone, and likely having a slow upper gastrointestinal bleed.  It is evidenced by her low hemoglobin and elevation of her BUN with guaiac-positive stool.  She also has slight elevation of INR, not on Coumadin, and we will give her a small amount of vitamin K p.o.  She will receive transfusion, and I suspect she will need at least 3-4 units, but we will start with 2.  I will put her on clear liquids, and we will give her 40 mg of Lasix IV between the first and the second unit since she had history of congestive heart failure.  We will put her on a Protonix drip as well.  Since she is hemodynamically stable, and the bleeding is slow, I feel that it is safe to put her on telemetry unit instead of the step-down.  Please consult GI as  she will likely need upper and lower endoscopy, but this will only be done after she has adequate blood transfusions and after being ruled out.  She is a stable patient, full code, and will be admitted to triad hospitalist team 2.     Houston Siren, MD     PL/MEDQ  D:  09/16/2010  T:  09/16/2010  Job:  [id number]  Electronically Signed by Houston Siren  on 10/01/2010 08:52:51 PM

## 2010-10-19 LAB — BASIC METABOLIC PANEL
BUN: 6 mg/dL (ref 6–23)
BUN: 6 mg/dL (ref 6–23)
BUN: 7 mg/dL (ref 6–23)
BUN: 8 mg/dL (ref 6–23)
CO2: 24 mEq/L (ref 19–32)
CO2: 25 mEq/L (ref 19–32)
CO2: 29 mEq/L (ref 19–32)
CO2: 29 mEq/L (ref 19–32)
Calcium: 8.4 mg/dL (ref 8.4–10.5)
Calcium: 8.4 mg/dL (ref 8.4–10.5)
Calcium: 8.6 mg/dL (ref 8.4–10.5)
Calcium: 8.6 mg/dL (ref 8.4–10.5)
Chloride: 105 mEq/L (ref 96–112)
Chloride: 109 mEq/L (ref 96–112)
Creatinine, Ser: 0.75 mg/dL (ref 0.4–1.2)
Creatinine, Ser: 0.75 mg/dL (ref 0.4–1.2)
Creatinine, Ser: 0.76 mg/dL (ref 0.4–1.2)
Creatinine, Ser: 0.78 mg/dL (ref 0.4–1.2)
GFR calc Af Amer: >60 mL/min (ref >60)
GFR calc Af Amer: >60 mL/min (ref >60)
GFR calc Af Amer: >60 mL/min (ref >60)
GFR calc non Af Amer: >60 mL/min (ref >60)
GFR calc non Af Amer: >60 mL/min (ref >60)
GFR calc non Af Amer: >60 mL/min (ref >60)
GFR calc non Af Amer: >60 mL/min (ref >60)
GFR calc non Af Amer: >60 mL/min (ref >60)
Glucose, Bld: 131 mg/dL — ABNORMAL HIGH (ref 70–99)
Glucose, Bld: 134 mg/dL — ABNORMAL HIGH (ref 70–99)
Glucose, Bld: 159 mg/dL — ABNORMAL HIGH (ref 70–99)
Glucose, Bld: 187 mg/dL — ABNORMAL HIGH (ref 70–99)
Potassium: 3.8 mEq/L (ref 3.5–5.1)
Potassium: 4 mEq/L (ref 3.5–5.1)
Potassium: 4.5 mEq/L (ref 3.5–5.1)
Sodium: 135 mEq/L (ref 135–145)
Sodium: 138 mEq/L (ref 135–145)
Sodium: 139 mEq/L (ref 135–145)

## 2010-10-19 LAB — TYPE AND SCREEN
ABO/RH(D): A POS
Antibody Screen: NEGATIVE

## 2010-10-19 LAB — URINALYSIS, ROUTINE W REFLEX MICROSCOPIC
Glucose, UA: NEGATIVE mg/dL
Hgb urine dipstick: NEGATIVE
Ketones, ur: NEGATIVE mg/dL
Protein, ur: NEGATIVE mg/dL
pH: 5.5 (ref 5.0–8.0)

## 2010-10-19 LAB — COMPREHENSIVE METABOLIC PANEL
ALT: 21 U/L (ref 0–35)
AST: 43 U/L — ABNORMAL HIGH (ref 0–37)
Albumin: 3 g/dL — ABNORMAL LOW (ref 3.5–5.2)
Alkaline Phosphatase: 82 U/L (ref 39–117)
Calcium: 8.3 mg/dL — ABNORMAL LOW (ref 8.4–10.5)
GFR calc Af Amer: >60 mL/min (ref >60)
Glucose, Bld: 145 mg/dL — ABNORMAL HIGH (ref 70–99)
Potassium: 4.1 mEq/L (ref 3.5–5.1)
Sodium: 135 mEq/L (ref 135–145)
Total Protein: 7 g/dL (ref 6.0–8.3)

## 2010-10-19 LAB — POCT I-STAT 3, ART BLOOD GAS (G3+)
Acid-Base Excess: 4 mmol/L — ABNORMAL HIGH (ref 0.0–2.0)
Bicarbonate: 24.7 mEq/L — ABNORMAL HIGH (ref 20.0–24.0)
Bicarbonate: 29.4 mEq/L — ABNORMAL HIGH (ref 20.0–24.0)
O2 Saturation: 90 %
O2 Saturation: 95 %
Patient temperature: 100.4
Patient temperature: 97
TCO2: 26 mmol/L (ref 0–100)
TCO2: 31 mmol/L (ref 0–100)
pCO2 arterial: 39 mmHg (ref 35.0–45.0)
pCO2 arterial: 47.2 mmHg — ABNORMAL HIGH (ref 35.0–45.0)
pH, Arterial: 7.406 — ABNORMAL HIGH (ref 7.350–7.400)
pH, Arterial: 7.407 — ABNORMAL HIGH (ref 7.350–7.400)
pO2, Arterial: 56 mmHg — ABNORMAL LOW (ref 80.0–100.0)
pO2, Arterial: 83 mmHg (ref 80.0–100.0)

## 2010-10-19 LAB — BRAIN NATRIURETIC PEPTIDE
Pro B Natriuretic peptide (BNP): 81 pg/mL (ref 0.0–100.0)
Pro B Natriuretic peptide (BNP): 84 pg/mL (ref 0.0–100.0)

## 2010-10-19 LAB — CBC
HCT: 27.9 % — ABNORMAL LOW (ref 36.0–46.0)
HCT: 29.3 % — ABNORMAL LOW (ref 36.0–46.0)
HCT: 32.4 % — ABNORMAL LOW (ref 36.0–46.0)
HCT: 32.8 % — ABNORMAL LOW (ref 36.0–46.0)
HCT: 35.5 % — ABNORMAL LOW (ref 36.0–46.0)
Hemoglobin: 10 g/dL — ABNORMAL LOW (ref 12.0–15.0)
Hemoglobin: 10.9 g/dL — ABNORMAL LOW (ref 12.0–15.0)
Hemoglobin: 12 g/dL (ref 12.0–15.0)
MCHC: 31.8 g/dL (ref 30.0–36.0)
MCHC: 33.4 g/dL (ref 30.0–36.0)
MCHC: 33.5 g/dL (ref 30.0–36.0)
MCHC: 33.9 g/dL (ref 30.0–36.0)
MCHC: 33.9 g/dL (ref 30.0–36.0)
MCHC: 34.2 g/dL (ref 30.0–36.0)
MCV: 82.7 fL (ref 78.0–100.0)
MCV: 84.6 fL (ref 78.0–100.0)
MCV: 85.1 fL (ref 78.0–100.0)
MCV: 85.3 fL (ref 78.0–100.0)
MCV: 85.7 fL (ref 78.0–100.0)
Platelets: 151 10*3/uL (ref 150–400)
Platelets: 161 10*3/uL (ref 150–400)
Platelets: 161 10*3/uL (ref 150–400)
Platelets: 190 10*3/uL (ref 150–400)
RBC: 3.42 MIL/uL — ABNORMAL LOW (ref 3.87–5.11)
RBC: 3.83 MIL/uL — ABNORMAL LOW (ref 3.87–5.11)
RBC: 3.85 MIL/uL — ABNORMAL LOW (ref 3.87–5.11)
RBC: 4.16 MIL/uL (ref 3.87–5.11)
RDW: 15.6 % — ABNORMAL HIGH (ref 11.5–15.5)
RDW: 15.8 % — ABNORMAL HIGH (ref 11.5–15.5)
RDW: 16.2 % — ABNORMAL HIGH (ref 11.5–15.5)
RDW: 16.3 % — ABNORMAL HIGH (ref 11.5–15.5)
RDW: 16.8 % — ABNORMAL HIGH (ref 11.5–15.5)
WBC: 11.4 10*3/uL — ABNORMAL HIGH (ref 4.0–10.5)
WBC: 14.7 10*3/uL — ABNORMAL HIGH (ref 4.0–10.5)
WBC: 6.8 10*3/uL (ref 4.0–10.5)
WBC: 9 10*3/uL (ref 4.0–10.5)
WBC: 9.5 10*3/uL (ref 4.0–10.5)

## 2010-10-19 LAB — CARDIAC PANEL(CRET KIN+CKTOT+MB+TROPI)
CK, MB: 0.8 ng/mL (ref 0.3–4.0)
CK, MB: 1.3 ng/mL (ref 0.3–4.0)
Relative Index: 0.8 (ref 0.0–2.5)
Relative Index: 0.9 (ref 0.0–2.5)
Total CK: 101 U/L (ref 7–177)
Total CK: 144 U/L (ref 7–177)
Troponin I: 0.02 ng/mL (ref 0.00–0.06)
Troponin I: 0.03 ng/mL (ref 0.00–0.06)
Troponin I: 0.03 ng/mL (ref 0.00–0.06)

## 2010-10-19 LAB — BLOOD GAS, ARTERIAL
Bicarbonate: 28.6 mEq/L — ABNORMAL HIGH (ref 20.0–24.0)
FIO2: 0.21 %
TCO2: 29.9 mmol/L (ref 0–100)
pH, Arterial: 7.437 — ABNORMAL HIGH (ref 7.350–7.400)
pO2, Arterial: 44.6 mmHg — ABNORMAL LOW (ref 80.0–100.0)

## 2010-10-19 LAB — CULTURE, BLOOD (ROUTINE X 2): Culture: NO GROWTH

## 2010-10-19 LAB — GLUCOSE, CAPILLARY: Glucose-Capillary: 131 mg/dL — ABNORMAL HIGH (ref 70–99)

## 2010-10-19 LAB — DIFFERENTIAL
Basophils Absolute: 0 10*3/uL (ref 0.0–0.1)
Basophils Relative: 1 % (ref 0–1)
Basophils Relative: 4 % — ABNORMAL HIGH (ref 0–1)
Eosinophils Absolute: 0.4 10*3/uL (ref 0.0–0.7)
Eosinophils Relative: 4 % (ref 0–5)
Lymphocytes Relative: 17 % (ref 12–46)
Lymphs Abs: 2.4 10*3/uL (ref 0.7–4.0)
Neutro Abs: 4.7 10*3/uL (ref 1.7–7.7)
Neutrophils Relative %: 75 % (ref 43–77)

## 2010-10-19 LAB — PROTIME-INR
INR: 1.5 (ref 0.00–1.49)
INR: 1.5 (ref 0.00–1.49)
Prothrombin Time: 17.2 seconds — ABNORMAL HIGH (ref 11.6–15.2)
Prothrombin Time: 18.4 seconds — ABNORMAL HIGH (ref 11.6–15.2)
Prothrombin Time: 19.2 seconds — ABNORMAL HIGH (ref 11.6–15.2)

## 2010-10-19 LAB — URINE CULTURE
Colony Count: 55000
Colony Count: NO GROWTH
Culture: NO GROWTH

## 2010-10-19 LAB — TROPONIN I: Troponin I: 0.01 ng/mL (ref 0.00–0.06)

## 2010-10-19 LAB — CK TOTAL AND CKMB (NOT AT ARMC)
CK, MB: 1.1 ng/mL (ref 0.3–4.0)
Relative Index: 0.8 (ref 0.0–2.5)
Total CK: 140 U/L (ref 7–177)

## 2010-10-19 LAB — HEMOGLOBIN A1C: Mean Plasma Glucose: 143 mg/dL

## 2010-11-12 ENCOUNTER — Ambulatory Visit (INDEPENDENT_AMBULATORY_CARE_PROVIDER_SITE_OTHER): Payer: Medicare Other | Admitting: Cardiovascular Disease

## 2010-11-12 ENCOUNTER — Encounter: Payer: Self-pay | Admitting: Cardiovascular Disease

## 2010-11-12 DIAGNOSIS — R0602 Shortness of breath: Secondary | ICD-10-CM

## 2010-11-12 DIAGNOSIS — K922 Gastrointestinal hemorrhage, unspecified: Secondary | ICD-10-CM

## 2010-11-12 DIAGNOSIS — R42 Dizziness and giddiness: Secondary | ICD-10-CM

## 2010-11-12 DIAGNOSIS — E785 Hyperlipidemia, unspecified: Secondary | ICD-10-CM

## 2010-11-12 DIAGNOSIS — R079 Chest pain, unspecified: Secondary | ICD-10-CM

## 2010-11-12 DIAGNOSIS — I2510 Atherosclerotic heart disease of native coronary artery without angina pectoris: Secondary | ICD-10-CM | POA: Insufficient documentation

## 2010-11-12 DIAGNOSIS — R609 Edema, unspecified: Secondary | ICD-10-CM

## 2010-11-12 NOTE — Progress Notes (Signed)
   Patient ID: [name], female    DOB: 12-28-1928, 77 y.o.   MRN: [id number]  HPI Comments: 77 yo with history of CAD s/p PCI in 1999 with peri-cath CVA,   stress test last year which showed ischemia in the inferior and inferolateral wall. She refused cardiac catheterization and has been treated medically.   Overall, she feels well. She is recovering from a significant GI bleed. She reports she was sent to [facility] with significant malaise, initially treated as chest pain. She was found to have profound anemia and was transfused at least 4 units of packed red blood cells. She had an EGD but the results are unavailable to [place] at this time. Her Plavix has been held. She does not take aspirin as she reports having a rash.  Did discuss her cholesterol and she does not want to take a statin despite history of coronary artery disease and stroke. Her husband reports that she is much better she is talking more than she normally does.   She and her husband questioned why she is on a blood pressure pill. They would like to stop the amlodipine.      She had a positive stress test and does not want a cardiac catheterization.    She is not taking ASA as she says that she "cannot take it" because even a baby aspirin makes her "run up the wall."     EKG shows normal sinus rhythm with rate 80 beats per minute with no significant ST or T wave changes.      Review of Systems  Constitutional: Positive for fatigue.  HENT: Negative.   Eyes: Negative.   Respiratory: Negative.   Cardiovascular: Negative.   Gastrointestinal: Negative.   Musculoskeletal: Negative.   Skin: Negative.   Neurological: Positive for weakness.  Hematological: Negative.   Psychiatric/Behavioral: Negative.   All other systems reviewed and are negative.   BP 122/60  Pulse 83  Ht 5\' 2"  (1.575 m)  Wt 138 lb (62.596 kg)  BMI 25.24 kg/m2   Physical Exam  Nursing note and vitals reviewed. Constitutional: She is oriented  to person, place, and time. She appears well-developed and well-nourished.  HENT:  Head: Normocephalic.  Nose: Nose normal.  Mouth/Throat: Oropharynx is clear and moist.  Eyes: Conjunctivae are normal. Pupils are equal, round, and reactive to light.  Neck: Normal range of motion. Neck supple. No JVD present.  Cardiovascular: Normal rate, regular rhythm, S1 normal, S2 normal, normal heart sounds and intact distal pulses.  Exam reveals no gallop and no friction rub.   No murmur heard. Pulmonary/Chest: Effort normal and breath sounds normal. No respiratory distress. She has no wheezes. She has no rales. She exhibits no tenderness.  Abdominal: Soft. Bowel sounds are normal. She exhibits no distension. There is no tenderness.  Musculoskeletal: Normal range of motion. She exhibits no edema and no tenderness.  Lymphadenopathy:    She has no cervical adenopathy.  Neurological: She is alert and oriented to person, place, and time. Coordination normal.  Skin: Skin is warm and dry. No rash noted. No erythema.  Psychiatric: She has a normal mood and affect. Her behavior is normal. Judgment and thought content normal.         Assessment and Plan

## 2010-11-12 NOTE — Assessment & Plan Note (Signed)
Will not check a cholesterol as she does not want a statin.

## 2010-11-12 NOTE — Assessment & Plan Note (Signed)
Currently with no symptoms of angina. No further workup at this time. Continue current medication regimen. She has indicated that she does not want to start aspirin or a statin. Plavix is on hold given her GI bleed.

## 2010-11-12 NOTE — Patient Instructions (Signed)
You are doing well. You could hold your amlodipine and monitor the blood pressure.  Please call us if you have new issues that need to be addressed before your next appt.  We will call you for a follow up Appt. In 6 months

## 2010-11-12 NOTE — Assessment & Plan Note (Signed)
She denies any significant chest pain at this time.

## 2010-11-23 ENCOUNTER — Encounter: Payer: Self-pay | Admitting: Cardiovascular Disease

## 2010-11-24 NOTE — Discharge Summary (Signed)
[name], [name]                ACCOUNT NO.:  [id number]   MEDICAL RECORD NO.:  [id number]          PATIENT TYPE:  INP   LOCATION:  5009                         FACILITY:  [facility]   PHYSICIAN:  [name], MD    DATE OF BIRTH:  Dec 02, 1928   DATE OF ADMISSION:  12/29/2008  DATE OF DISCHARGE:  01/02/2009                               DISCHARGE SUMMARY   ADMISSION DIAGNOSIS:  Right femoral neck fracture.   DISCHARGE DIAGNOSES:  1. Right femoral neck fracture.  2. Acute postoperative blood loss anemia.  3. Mild postoperative confusion and delirium, likely associated with      benzodiazepine use.  4. A history of stroke.  5. History of COPD and asthma.  6. History of reflux disease.   DISCHARGE MEDICATIONS:  She will resume her home medications, and will  also take Lovenox 40 mg subcutaneous daily until January 28, 2009.   HOSPITAL COURSE:  [name] is a 77 year old woman who fell  and had a right femoral neck fracture.  She was optimized medically and  then went to surgery for right hip hemiarthroplasty.  She tolerated the  procedure well and postoperatively was transferred to the step-down unit  for close observation.  She had an episode of hypoxemia in the emergency  room with saturation levels that were in the mid 80s, and therefore we  closely observed her.  She was doing reasonably well, however, got some  confusion and delirium postoperatively.  The exact etiology was not  clear.  She had a stroke workup with a CT of the head which was  negative.  She also had EKGs which were unchanged.  She had cardiac  enzymes were negative.  She had a chest x-ray that showed some  atelectasis.  Ultimately, I believe that the etiology of problem was  that she had initially indicated that she was on Valium daily according  to the medical reconciliation of her home medications.  However, as it  turns out, after discussing further with the family, it appears that she  was only  taking Valium once weekly, and this was 1/2 tablet of 5 mg, or  a whole tablet at most.  Once this was discontinued, she then had  significant improvement in her mental status and was no longer  lethargic.  She was transferred to the floor.  She had acute  postoperative blood loss anemia and was transfused 2 units of packed red  blood cells.  She did lose her IV on the morning of June 24, and I  discontinued all of her IV medications.  She had previously been on  vancomycin and Zosyn for fear of sepsis given her altered mental status.  However, there was never any clear evidence that she had an infection.  Her dressings were clean and her wounds were not draining.  She did have  a large hematoma over the right hip.  Her white count was normal and she  remained afebrile through her hospital stay.  She was placed on Coumadin  initially, but after only one or two doses her  INR was 4, so we switched  her to Lovenox once her Coumadin went below 2.0.  I anticipate that  regulating her INR is going to be extremely difficult and she is at  increased risk for bleeding due to her tendency toward a very high INR,  so therefore, we switched to Lovenox for DVT prophylaxis.  She did have  a chest x-ray on the day of discharge that was read as increased right  pleural effusion.  I spoke with the radiologist directly and he  indicated that this is only slight, and may be positional and may not be  relevant clinically.  She appears very well clinically and is demanding  to leave the hospital.  This appears reasonable, and we may get  follow-up x-rays if indicated clinically.  She benefited maximally from  her hospital stay and is planned to be transferred to skilled nursing  facility.  She will follow up with me in approximately 2 weeks.  She can  be weightbearing as tolerated, observing posterior hip precautions on  the right hip.      [name], MD  Electronically Signed     JPL/MEDQ  D:   01/02/2009  T:  01/02/2009  Job:  [id number]

## 2010-11-24 NOTE — Op Note (Signed)
[name], [name]                ACCOUNT NO.:  [id number]   MEDICAL RECORD NO.:  [id number]          PATIENT TYPE:  INP   LOCATION:  3304                         FACILITY:  [facility]   PHYSICIAN:  [name], MD    DATE OF BIRTH:  August 27, 1928   DATE OF PROCEDURE:  12/29/2008  DATE OF DISCHARGE:                               OPERATIVE REPORT   ATTENDING PHYSICIAN:  [name], MD   FIRST ASSISTANT:  Kirstin Shepperson, PA-C   PREOPERATIVE DIAGNOSIS:  Right displaced femoral neck fracture.   POSTOPERATIVE DIAGNOSIS:  Right displaced femoral neck fracture.   OPERATIVE PROCEDURE:  Right hip hemiarthroplasty.   OPERATIVE IMPLANTS:  DePuy size 3 femoral stem, Summit basic cemented  with a size 45-mm fracture head hip ball and a size 8.5-mm centralizer  with a size +0 tapered spacer and a total of 2 bags of DePuy CMW 1 bone  cement with a 3 cement restrictor.   PREOPERATIVE INDICATIONS:  Ms. [name] is a 77 year old woman  who fell today and broke her right hip.  She was unable to walk.  She  elected to undergo the above-named procedures.  The risks, benefits, and  alternatives were discussed preoperatively including, but not limited to  the risks of infection, bleeding, nerve injury, periprosthetic fracture,  the need for revision surgery, limp, length discrepancy, dislocation,  blood clots, blood transfusion, cardiopulmonary complications, among  others, and she is willing to proceed.   OPERATIVE PROCEDURE:  The patient was brought to the operative room and  placed in the supine position.  Intravenous Ancef 1 g was given.  She  was turned in the lateral decubitus position after general anesthesia  was administered.  The right lower extremity was prepped and draped in  the usual sterile fashion.  Posterolateral approach was made.  The  external rotators and the capsule were incised.  These were tacked with  FiberWire.  The capsule was T'ed.  We then placed the guide  and recut  the femoral neck in order to freshen the surface and provide the  appropriate height.   We then extracted the femoral head and sized it.  We removed the  ligamentum teres in the acetabulum.  We then exposed the proximal femur  and lateralized using the reamer.  She had a very small proximal femur.  I sequentially broached up to a size 3.  A trial was placed and the +0  had excellent stability and appropriate restoration of length and soft  tissue tension.  We then selected the above-named component and cemented  them all in place.  All excess cement was removed.  The hip was reduced  and taken through a range of motion, was found to have excellent  stability and symmetric leg lengths.  The capsule was repaired using  Ethibond and FiberWire, as well the piriformis.  These were taken  through drill holes in the greater trochanter using the smallest bit  possible.  We then irrigated the wounds again copiously and closed the  fascia with 0 Vicryl  followed by 3-0 for the  subcutaneous tissue and 4-  0 Monocryl with Steri-Strips, and sterile gauze for the skin.  A Mepilex  dressing was placed.  The patient was turned  supine and an abduction pillow was placed.  She returned to the PACU in  stable and satisfactory condition.  There were no complications and she  tolerated the procedure well.  She will be admitted overnight to the  Step-Down Unit given her history of pulmonary problems.  She tolerated  the procedure well.      [name], MD  Electronically Signed     JPL/MEDQ  D:  12/29/2008  T:  12/30/2008  Job:  [id number]

## 2010-11-24 NOTE — H&P (Signed)
NAMESHAMEKIA, TIPPETS                ACCOUNT NO.:  [id number]   MEDICAL RECORD NO.:  [id number]          PATIENT TYPE:  INP   LOCATION:  3304                         FACILITY:  [facility]   PHYSICIAN:  [name], MD    DATE OF BIRTH:  12/10/1928   DATE OF ADMISSION:  12/29/2008  DATE OF DISCHARGE:                              HISTORY & PHYSICAL   CHIEF COMPLAINT:  Right hip pain.   HISTORY OF PRESENT ILLNESS:  The patient is a 77 year old white female  who fell at home today opening a refrigerator.  She had immediate 10/10  pain in her right hip.  She was unable to get up.  She called her  husband who is acutely status post cardiac cath on Friday who was unable  to lift her but who did call EMS for help.  She currently has a moderate  ache, throbbing pain in her right hip.  She has no other complaints of  pain.  Previous musculoskeletal injuries include right proximal humerus  fracture that was treated closed in December and January of 2009 and  2010 and right ankle fracture that was treated with open reduction and  internal fixation by Dr. Gean Birchwood in 2004.   Medical history is significant for stroke 12 years ago per the patient,  it happened when a clot broke off during cardiac cath.  She had left-  sided weakness following this, although most of her symptoms have  resolved.  Medical history is significant for COPD/asthma,  gastroesophageal reflux disease, anxiety, cardiac cath with possible  stent done by [facility], cataract surgery, aforementioned  history of proximal humerus fracture, and history of ORIF of an ankle.   FAMILY HISTORY:  Unknown.   SOCIAL HISTORY:  The patient lives with her husband.  No history of  alcohol, tobacco, or drug abuse.   ALLERGIES:  CODEINE, possibly no other known drug allergies.   MEDICATIONS:  1. Celebrex 200 mg b.i.d. p.r.n. pain.  2. Singulair 10 mg daily.  3. Valium 5 mg half tablet 1 tablet a day as needed for anxiety.  4.  Omeprazole 20 mg 1 p.o. b.i.d.  The patient is very symptomatic if      she forgets to take her omeprazole.   REVIEW OF SYSTEMS:  No fever or chills.  Positive for blurred vision.  No rashes or abrasions.  Positive right hip pain.  No shortness of  breath.  No chest pain.  No peripheral edema.  Positive vomiting this  morning.  She attributes this to pain after she fell.  No vaginal  discharge.  Affect is appropriate.  No numbness.  No tingling.  No  wheeze and no cough.  No excessive thirst.   OBJECTIVE:  VITAL SIGNS:  On examination, O2 sat is 97% on 2 L, blood  pressure is 104/75, respirations are 18, pulse of 86, temp is 97.7.  GENERAL:  She is a well-nourished, well-developed 77 year old white  female who is alert and oriented x3 in no acute distress.  She is  resting comfortably following pain medicine for right hip fracture.  HEENT:  She is normocephalic, atraumatic.  Extraocular movements are  intact.  Pupils are equally round and reactive to light and  accommodation.  Oropharynx is clear.  Uvula is midline.  Hearing aid in  her right ear.  NECK:  Supple.  No adenopathy.  No cervical pain to palpation.  CHEST:  Clear to auscultation.  She does have mild wheezing.  No rales  or rhonchi.  BREAST:  Not done.  HEART:  Normal S1 and S2 without murmur, rub, or gallop.  ABDOMEN:  Soft and nontender with positive bowel sounds.  It is  protuberant.  She has no palpable organomegaly.  She has no vaginal  discharge.  Foley is in place.  EXTREMITIES:  Right hip is shortened, externally rotated.  She has 2+  dorsalis pedis pulses.  Positive EHL.  Positive FHL.  Right upper  extremity needs active assist to full range of motion.  Left upper  extremity has full active range of motion.  Left lower extremity has  full range of motion without pain, swelling, or deformity.  Also, has a  2+ dorsalis pedis pulse.  SKIN:  Warm and dry without rashes or abrasions around her hip.  Her  right elbow  does have an abrasion secondary to her fall.  NEUROLOGIC:  The patient is alert and oriented x3.  She cannot remember  her phone number but is oriented to her person, place, and situation at  this time.  Cranial nerves II through XII are grossly intact.  She is  hard of hearing with a slight deficit that is corrected with hearing  aids.   X-rays of her right hip show right hip femoral neck fracture.  X-rays of  her chest showed no pulmonary edema, do show some basilar scarring.   IMPRESSION:  1. Right hip femoral neck fracture.  2. Asthma.  3. Chronic obstructive pulmonary disease.  4. Coronary artery disease, possible cardiac stent.  5. Gastroesophageal reflux disease.  6. Anxiety.  7. History of proximal humerus fracture.  8. History of an open reduction and internal fixation of her right      ankle.  9. History of cataract surgery.   PLAN:  At this point in time, she has been seen by Medicine and cleared  for surgery.  We were planning on taking her to the OR.  She will be  admitted postoperatively for pain control, DVT prophylaxis, and physical  therapy.  Risks, benefits, and possible complications of surgery have  been discussed with the patient and her sister, son, and daughter-in-  Social worker.  They understand this and are without question.       Kirstin Shepperson, P.A.      [name], MD  Electronically Signed    KS/MEDQ  D:  12/29/2008  T:  12/30/2008  Job:  [id number]

## 2010-11-24 NOTE — Consult Note (Signed)
[name], [name] NO.:  [id number]   MEDICAL RECORD NO.:  [id number]          PATIENT TYPE:  INP   LOCATION:  2109                         FACILITY:  [facility]   PHYSICIAN:  Della Goo, M.D. DATE OF BIRTH:  May 19, 1929   DATE OF CONSULTATION:  12/31/2008  DATE OF DISCHARGE:                                 CONSULTATION   REQUESTING PHYSICIAN:  This is a medical consultation requested by the  orthopedic services of [facility], M[facility]D.   REASON FOR CONSULTATION:  Altered mental status.   HISTORY OF PRESENT ILLNESS:  This is a 77 year old female who was  admitted to the hospital on December 29, 2008, and underwent a right hip  hemiarthroplasty status post a mechanical fall.  This surgery was  performed by Dr. Dion Saucier.  The Medical Service was called for  consultation, when the patient began to have decreased level of  consciousness on the Floor at about 7:00 p.m..  The patient reported not  feeling right, feeling confused, and was found to be febrile with a  temperature 100.7.  The patient denied having any chest pain, shortness  of breath, cough.  She also denies having any nausea, vomiting or  diarrhea.  The patient was found to be tachycardic and hypertensive.   The patient was also evaluated by the Rapid Response Team.  The patient  was found to be mildly hypoxic and an arterial blood gas was performed;  however, these results appear to be a venous blood gas.  The patient was  placed on supplemental oxygen and was transferred to the intensive care  unit.  Blood cultures and cardiac enzymes were sent, and the patient was  sent for a CT scan of the head.   PAST MEDICAL HISTORY:  1. A previous CVA with left-sided weakness.  2. History of COPD/asthma.  3. Gastroesophageal reflux disease.  4. Status post cataract surgery.  5. Status post open reduction internal fixation of previous right      proximal humerus fracture.  6. The patient has a history of  coronary artery disease as well.  She      has had a previous cardiac catheterization.   CURRENT MEDICATIONS:  1. Advair.  2. Singulair.  3. Protonix.  4. Robaxin.  5. Zofran.  6. Ambien p.r.n.  7. Senokot.  8. Coumadin protocol.  9. Diazepam p.r.n.  10.Albuterol rescue inhaler p.r.n.   ALLERGIES:  CODEINE.   SOCIAL HISTORY:  The patient lives with her husband.  The patient has no  history of alcohol, tobacco or illicit drug usage.   PHYSICAL EXAMINATION FINDINGS:  This is an elderly 77 year old female in  discomfort but no acute distress.  VITAL SIGNS:  Temperature 100.7, blood pressure at 1830 was 132/70,  heart rate 107, and respirations 18-20.  O2 saturations were 91% on 2  liters.  HEENT:  Normocephalic, atraumatic.  There is no scleral icterus.  Pupils  are equally round and reactive to light.  Extraocular movements are  intact.  Funduscopic benign.  Nares are patent bilaterally.  Oropharynx  is clear.  NECK:  Supple, full range of motion.  No thyromegaly, adenopathy or  jugular venous distention.  CARDIOVASCULAR:  Regular rate and rhythm, with mild tachycardia.  No  murmurs, gallops or rubs.  LUNGS:  Clear to auscultation bilaterally.  ABDOMEN:  Positive bowel sounds; soft, nontender, nondistended.  EXTREMITIES:  Without cyanosis, clubbing or edema.  NEUROLOGIC:  The patient is alert and oriented x3.  She is able to move  all 4 of her extremities; however, is limited secondary to surgery of  her right lower extremity.   LABORATORY STUDIES:  White blood cell count 11.4, hemoglobin 12.0,  hematocrit 35.5, platelets 161, neutrophils 63%, MCV 85.3.  Sodium 136,  potassium 4.5, chloride 102, CO2 28, BUN 8, creatinine 0.76, glucose  127.  Beta natriuretic peptide 84.0.  Cardiac Enzymes:  Total CK of 144,  CK-MB 1.3, relative index 0.9, and troponin 0.02.  EKG performed and  reveals some ST depression in the inferior leads; it is not clear.  Chest x-ray reveals bilateral  lower lobe scarring and atelectasis, and  small left pleural effusion.   ASSESSMENT:  A 77 year old female being seen for:  1. Altered mental status.  2. Febrile illness, possible early sepsis.  3. Right hip fracture, status post right hemiarthroplasty.   PLAN:  The patient has been moved to the intensive care unit for closer  monitoring.  Cardiac enzymes will be performed.  Blood cultures have  been ordered, and the patient has been placed on empiric antibiotic  therapy of vancomycin and Zosyn to cover for a hospital-acquired  pneumonia.  The patient will be monitored for further neurologic  changes.  A repeat arterial blood gas will also be performed.  The  patient has improved with supplemental oxygen.  Further workup will  ensue, pending results of the patient's clinical course and results of  her studies.   The Medicine Service will continue to follow.  The patient is currently  on the Coumadin protocol.  Her prothrombin time and INR were mildly  supratherapeutic, and the patient was administered vitamin K therapy per  the Primary Team.  The INR was 4.0.      Della Goo, M.D.  Electronically Signed     HJ/MEDQ  D:  12/31/2008  T:  01/01/2009  Job:  [id number]

## 2010-11-27 NOTE — Op Note (Signed)
NAME:  SHARMANE, DAME                        ACCOUNT NO.:  [id number]   MEDICAL RECORD NO.:  [id number]                   PATIENT TYPE:  INP   LOCATION:  1823                                 FACILITY:  [facility]   PHYSICIAN:  Feliberto Gottron. Turner Daniels, M.D.                DATE OF BIRTH:  04/25/1929   DATE OF PROCEDURE:  03/25/2003  DATE OF DISCHARGE:                                 OPERATIVE REPORT   PREOPERATIVE DIAGNOSIS:  Right ankle bimalleolar fracture and dislocation.   POSTOPERATIVE DIAGNOSIS:  Right ankle bimalleolar fracture and dislocation.   OPERATION PERFORMED:  Right ankle open reduction internal fixation using the  DePuy small fragment set with a single 4 mm x 40 mm medial lag screw.  On  the lateral side, we used a 7-hole one third tubular titanium plate and an  anterior to posterior interfragmentary screw.   SURGEON:  Feliberto Gottron. Turner Daniels, M.D.   ASSISTANTLaural Benes. Jannet Mantis.   ANESTHESIA:  General endotracheal.   ESTIMATED BLOOD LOSS:  Minimal.   FLUIDS REPLACED:  crystalloid.   DRAINS:  None.   TOURNIQUET TIME:  46 minutes.   INDICATIONS FOR PROCEDURE:  The patient is a 77 year old woman who slipped  and fell early on the morning of March 25, 2003 and sustained a closed  right ankle bimalleolar fracture dislocation.  She was transported to the  [facility] Emergency Department.  I was paged around 6:16 in the morning,  saw her at 6:30. As I introduced myself, I performed a closed reduction to  avoid the medial bone coming through the skin which it was tenting, which is  why Dr. Ignacia Palma called me.  X-rays confirmed the bimalleolar fracture  dislocation.  This was obviously unstable and she was taken for open  reduction internal fixation.   DESCRIPTION OF PROCEDURE:  The patient was identified by arm band and taken  to the operating room at [facility] main hospital where appropriate anesthetic  monitors were attached.  General endotracheal anesthesia was induced  with  the patient in supine position and a tourniquet applied high to the right  calf.  The right lower extremity was prepped and draped in the usual sterile  fashion from the toes to the tourniquet, limb wrapped with an Esmarch  bandage.  Tourniquet inflated to 300 mmHg.  We began the procedure by making  a longitudinal medial incision over the medial malleolus going from slightly  posterior and proximal to anterior and distal exposing the fracture site.  The edges of the fracture were clean.  We retracted down the medial  malleolus to get a good look at the tibiotalar joint and there was a minimal  amount of arthritic changes but no obvious loose cartilage or cracks in the  cartilage.  After carefully evaluating the tibiotalar joint, the medial  malleolus was reduced under direct vision and held with a towel clip and  then fixed with a 4 mm x 40 mm lag screw drilling first with a 2.9 drill bit  and then with the 3.5 just in the distal fragment.  Good firm fixation was  accomplished.  C-arm images were taken confirming good position of the  screw.  We then directed our attention laterally now that the ankle had been  provisionally stabilized with a medial screw and made a longitudinal  incision from the tip of the lateral malleolus proximal for about 10 cm  through the skin and subcutaneous tissue.  We then used the tenotomy  scissors to dissect down to the oblique fracture over the lateral malleolus  being careful to avoid any significant branches of the superficial peroneal  or the sural nerve.  Once we gotten down to the fracture site, the  periosteal elevator was used to remove periosteum around the fracture site  which was then reduced under direct vision and provisionally held with a  lion's jaw towel clamp.  We then went from anterior proximal to posterior  distal and placed a single 4 mm cortical interfrag screw, I believe it was  16 mm in length, obtaining good firm bite as we  lagged the distal fragment  to the proximal fibula.  A 7-hole neutralization plate was then bent to  conform to the lateral fibula going from the tip of the fibula proximally  over the length of the 7-hole plate.  Distally, we used two cancellous 4 mm  screws and proximally four 3.5 bicortical screws.  The hole over the  interfrag screw was left open.  C-arm images were taken confirming good  position of the hardware.  The hook test was negative.  The tourniquet let  down, small bleeders identified and cauterized.  This lady was very slender.  On the lateral side we simply closed her with running interlocking 3-0  nylon, on the medial side, we were able to get a subcuticular layer followed  by the running interlocking 3-0 nylon.  A dressing of Xeroform, 4 x 4  dressing sponges, Webril and Ace wrap and a medium Cam walker boot applied.  The patient was awakened and taken to the recovery room without difficulty.                                                Feliberto Gottron. Turner Daniels, M.D.    Ovid Curd  D:  03/25/2003  T:  03/25/2003  Job:  [id number]

## 2010-11-27 NOTE — Discharge Summary (Signed)
NAME:  [name], [name]                        ACCOUNT NO.:  [id number]   MEDICAL RECORD NO.:  [id number]                   PATIENT TYPE:  INP   LOCATION:  3029                                 FACILITY:  [facility]   PHYSICIAN:  Feliberto Gottron. Turner Daniels, M.D.                DATE OF BIRTH:  1929/02/08   DATE OF ADMISSION:  03/25/2003  DATE OF DISCHARGE:  03/27/2003                                 DISCHARGE SUMMARY   PRIMARY DIAGNOSIS FOR THIS ADMISSION:  Fracture of the right ankle.   PROCEDURE WHILE IN THE HOSPITAL:  ORIF of right ankle.   DISCHARGE SUMMARY/HISTORY OF PRESENT ILLNESS:  The patient is a 77 year old  woman who slipped and fell early on the morning of March 25, 2003.  She  sustained a closed right ankle bimalleolar fracture dislocation.  She was  transported to [facility] Emergency Department by EMS.  Dr. Turner Daniels was paged  and saw her in consultation, performing a closed reduction in the ER to  avoid medial bone coming through the skin which was tenting.  X-rays  confirmed bimalleolar fracture dislocation.  This was obviously unstable and  the patient was admitted for open reduction/internal fixation.  Preoperative  labs including CBC, C-MET, chest x-ray, EKG, PT and PTT were all within  normal limits with the exception of platelets at 410.  Glucose of 122.   ALLERGIES:  CODEINE which causes her tongue to swell.   MEDICATIONS AT TIME OF ADMISSION:  Furosemide, Celebrex and Prilosec.   PAST MEDICAL HISTORY:  1. Usual childhood diseases.  2. Adult history: GERD.  3. CAD.  4. CVA in 2000.   SURGICAL HISTORY:  1. Angioplasty with stent.  2. Mastectomy without any history of cancer.  3. Hysterectomy.  No difficulties with GET except for nausea.   SOCIAL HISTORY:  No tobacco, no ethanol, no IV drug abuse.  She is married  and lives with her husband.  She is a retired day Set designer.   FAMILY HISTORY:  Mother died at 8 with a history of MI and diabetes.  Father died at 49  with a history of cancer and MI.   REVIEW OF SYSTEMS:  Patient denies chest pain, shortness of breath.  Does  have upper and lower dentures.  Wears a hearing aid and has glasses.   PHYSICAL EXAMINATION:  GENERAL:  The patient is a 5 foot 2, 125 pound  female.  HEENT:  She is edentulous.  Head is atraumatic.  Eyes, pupils equal, round,  reactive to light and accomodation.  Nose is patent.  Throat benign.  NECK:  Supple.  Full range of motion.  CHEST:  Clear to auscultation.  BREASTS:  Post mastectomy.  HEART:  Regular rate and rhythm.  ABDOMEN:  Soft, nontender.  EXTREMITIES:  Normal except for the right ankle which is splinted and  otherwise neurovascularly intact.   HOSPITAL COURSE:  The patient was  taken, on the day of admission, to the  [facility] OR where she underwent a right ankle ORIF for bimalleolar fracture  dislocation, using the DePuy small fragment set.  No drains were placed.  She was placed on perioperative antibiotics for the first 40 hours.  She was  placed on postoperative Dilaudid PCA for pain control.   Also please note that she had both pre and postoperative medical management  by family practice teaching service.   First postoperative day, the patient was awake and alert in moderate pain.  Vital signs were stable.  She was afebrile.  She was neurovascularly intact.  Hemoglobin 10.9, otherwise stable.  Her Foley was discontinued.  She was  begun on physical therapy toe-touch weightbearing.  Her husband was  agreeable to building a ramp at the home.  So, discharge planning was begun.  Postoperative day two, the patient was without complaints.  She was  afebrile.  Vital signs were stable.  CAM Walker was in good position.  She  was otherwise neurovascularly intact.  Hemoglobin 10.5.  WBC of 15.5 and she  was otherwise improving.  She was discharged home to the care of her family,  after being cleared by family practice teaching service.  She will return to  the clinic  in one week's time for a followup appointment.  Sooner if she is  having any increase in pain or fever.   MEDICINES AT TIME OF DISCHARGE:  1. Tylox for pain, to use sparingly.  2. Resume home meds.   Nonweightbearing right lower extremity with walker or toe-touch  weightbearing with walker.  Wheelchair for home use.  Diet is regular.  She  will continue to ice and elevate the right lower extremity.      Laural Benes. Jannet Mantis.                     Feliberto Gottron. Turner Daniels, M.D.    Merita Norton  D:  04/05/2003  T:  04/07/2003  Job:  [id number]

## 2011-07-13 DIAGNOSIS — K56609 Unspecified intestinal obstruction, unspecified as to partial versus complete obstruction: Secondary | ICD-10-CM

## 2011-07-13 HISTORY — DX: Unspecified intestinal obstruction, unspecified as to partial versus complete obstruction: K56.609

## 2011-09-21 ENCOUNTER — Encounter: Payer: Self-pay | Admitting: Cardiovascular Disease

## 2011-09-21 ENCOUNTER — Ambulatory Visit (INDEPENDENT_AMBULATORY_CARE_PROVIDER_SITE_OTHER): Payer: Medicare Other | Admitting: Cardiovascular Disease

## 2011-09-21 DIAGNOSIS — I2510 Atherosclerotic heart disease of native coronary artery without angina pectoris: Secondary | ICD-10-CM

## 2011-09-21 DIAGNOSIS — R0602 Shortness of breath: Secondary | ICD-10-CM

## 2011-09-21 DIAGNOSIS — E785 Hyperlipidemia, unspecified: Secondary | ICD-10-CM

## 2011-09-21 DIAGNOSIS — R609 Edema, unspecified: Secondary | ICD-10-CM

## 2011-09-21 DIAGNOSIS — R079 Chest pain, unspecified: Secondary | ICD-10-CM

## 2011-09-21 MED ORDER — TORSEMIDE 20 MG PO TABS: 20.0000 mg | ORAL_TABLET | Freq: Two times a day (BID) | ORAL | Status: DC | PRN

## 2011-09-21 MED ORDER — NITROGLYCERIN 0.4 MG SL SUBL: 0.4000 mg | SUBLINGUAL_TABLET | SUBLINGUAL | Status: DC | PRN

## 2011-09-21 NOTE — Progress Notes (Signed)
Patient ID: [name], female    DOB: [birth year], 78 y.o.   MRN: [id number]  HPI Comments: 78 yo with history of CAD s/p PCI in 1999 with peri-cath CVA,   Previous stress test  which showed ischemia in the inferior and inferolateral wall. She refused cardiac catheterization and has been treated medically. h/o significant GI bleed, admitted to [facility] with significant malaise, initially treated as chest pain, found to have profound anemia and was transfused at least 4 units of packed red blood cells. She had an EGD. Her Plavix has been held. She does not take aspirin as she reports having a rash.  she does not want to take a statin despite history of coronary artery disease and stroke. She reports that she has been doing well. She walks with a walker. Also has a scooter for long distances. Her husband who accompanies her help her around the house. She denies any significant chest pain.  Blood pressure sometimes runs high. She also has edema bilaterally above the sock line that is pitting.  She is not taking ASA as she says that she "cannot take it" because even a baby aspirin makes her "run up the wall."     EKG shows normal sinus rhythm with rate 93 beats per minute with no significant ST or T wave changes, poor R wave progression to the anterior precordial leads, unable to rule out old anterior infarct    Outpatient Encounter Prescriptions as of 09/21/2011  Medication Sig Dispense Refill  . fluocinonide cream (LIDEX) 0.05 % as directed.      . montelukast (SINGULAIR) 10 MG tablet Take 10 mg by mouth at bedtime.        . nitroGLYCERIN (NITROSTAT) 0.4 MG SL tablet Place 1 tablet (0.4 mg total) under the tongue every 5 (five) minutes as needed.  25 tablet  6  . omeprazole (PRILOSEC) 20 MG capsule Take 20 mg by mouth daily.        . potassium chloride (KLOR-CON) 10 MEQ CR tablet Take 10 mEq by mouth 2 (two) times daily.        . furosemide (LASIX) 40 MG tablet Take 40 mg by mouth daily.           Review of Systems  HENT: Negative.   Eyes: Negative.   Respiratory: Negative.   Cardiovascular: Negative.   Gastrointestinal: Negative.   Musculoskeletal: Positive for gait problem.  Skin: Negative.   Neurological: Positive for weakness.  Hematological: Negative.   Psychiatric/Behavioral: Negative.   All other systems reviewed and are negative.   BP 150/75  Pulse 89  Ht 5\' 2"  (1.575 m)  Wt 135 lb (61.236 kg)  BMI 24.69 kg/m2  Physical Exam  Nursing note and vitals reviewed. Constitutional: She is oriented to person, place, and time. She appears well-developed and well-nourished.  HENT:  Head: Normocephalic.  Nose: Nose normal.  Mouth/Throat: Oropharynx is clear and moist.  Eyes: Conjunctivae are normal. Pupils are equal, round, and reactive to light.  Neck: Normal range of motion. Neck supple. No JVD present. Carotid bruit is present.  Cardiovascular: Normal rate, regular rhythm, S1 normal, S2 normal and intact distal pulses.  Exam reveals no gallop and no friction rub.   Murmur heard.  Systolic murmur is present with a grade of 2/6  Pulmonary/Chest: Effort normal and breath sounds normal. No respiratory distress. She has no wheezes. She has no rales. She exhibits no tenderness.  Abdominal: Soft. Bowel sounds are normal. She exhibits  no distension. There is no tenderness.  Musculoskeletal: Normal range of motion. She exhibits edema. She exhibits no tenderness.  Lymphadenopathy:    She has no cervical adenopathy.  Neurological: She is alert and oriented to person, place, and time. Coordination normal.  Skin: Skin is warm and dry. No rash noted. No erythema.  Psychiatric: She has a normal mood and affect. Her behavior is normal. Judgment and thought content normal.         Assessment and Plan

## 2011-09-21 NOTE — Assessment & Plan Note (Signed)
We'll change her Lasix to torsemide 20 mg daily. We have suggested she take extra torsemide in the afternoon for worsening edema or shortness of breath.

## 2011-09-21 NOTE — Patient Instructions (Addendum)
You are doing well. Please stop the lasix/furosemide Start torsemide once a day Take an extra torsemide for edema, shortness of breath or high bl0od pressure  Please call us if you have new issues that need to be addressed before your next appt.  Your physician wants you to follow-up in: 6 months.  You will receive a reminder letter in the mail two months in advance. If you don't receive a letter, please call our office to schedule the follow-up appointment.

## 2011-09-21 NOTE — Assessment & Plan Note (Addendum)
Currently with no symptoms of angina. No further workup at this time. She has refused cardiac catheterization in the past. She does not want aspirin.

## 2011-09-21 NOTE — Assessment & Plan Note (Signed)
She does have trace pitting edema above the sock line bilaterally. We will try to encourage gentle diuresis as tolerated.

## 2011-09-21 NOTE — Assessment & Plan Note (Signed)
She does not want a statin 

## 2011-10-25 ENCOUNTER — Ambulatory Visit
Admission: RE | Admit: 2011-10-25 | Discharge: 2011-10-25 | Disposition: A | Discharge status: Home / Self Care | Payer: Medicare Other | Source: Ambulatory Visit | Attending: Family Medicine | Admitting: Family Medicine

## 2011-10-25 ENCOUNTER — Inpatient Hospital Stay (HOSPITAL_COMMUNITY)
Admission: EM | Admit: 2011-10-25 | Discharge: 2011-11-03 | DRG: 357 | Disposition: A | Discharge status: Home / Self Care | Payer: Medicare Other | Attending: Internal Medicine | Admitting: Internal Medicine

## 2011-10-25 ENCOUNTER — Encounter (HOSPITAL_COMMUNITY): Payer: Self-pay | Admitting: *Deleted

## 2011-10-25 ENCOUNTER — Other Ambulatory Visit: Payer: Self-pay | Admitting: Family Medicine

## 2011-10-25 DIAGNOSIS — R079 Chest pain, unspecified: Secondary | ICD-10-CM

## 2011-10-25 DIAGNOSIS — F411 Generalized anxiety disorder: Secondary | ICD-10-CM | POA: Diagnosis present

## 2011-10-25 DIAGNOSIS — K7290 Hepatic failure, unspecified without coma: Secondary | ICD-10-CM | POA: Diagnosis present

## 2011-10-25 DIAGNOSIS — Z8673 Personal history of transient ischemic attack (TIA), and cerebral infarction without residual deficits: Secondary | ICD-10-CM

## 2011-10-25 DIAGNOSIS — R188 Other ascites: Secondary | ICD-10-CM | POA: Diagnosis present

## 2011-10-25 DIAGNOSIS — Z9010 Acquired absence of unspecified breast and nipple: Secondary | ICD-10-CM

## 2011-10-25 DIAGNOSIS — Z853 Personal history of malignant neoplasm of breast: Secondary | ICD-10-CM

## 2011-10-25 DIAGNOSIS — D61818 Other pancytopenia: Secondary | ICD-10-CM | POA: Diagnosis present

## 2011-10-25 DIAGNOSIS — I2510 Atherosclerotic heart disease of native coronary artery without angina pectoris: Secondary | ICD-10-CM | POA: Diagnosis present

## 2011-10-25 DIAGNOSIS — K56609 Unspecified intestinal obstruction, unspecified as to partial versus complete obstruction: Principal | ICD-10-CM | POA: Diagnosis present

## 2011-10-25 DIAGNOSIS — R140 Abdominal distension (gaseous): Secondary | ICD-10-CM

## 2011-10-25 DIAGNOSIS — E785 Hyperlipidemia, unspecified: Secondary | ICD-10-CM | POA: Diagnosis present

## 2011-10-25 DIAGNOSIS — R609 Edema, unspecified: Secondary | ICD-10-CM | POA: Diagnosis present

## 2011-10-25 DIAGNOSIS — R0602 Shortness of breath: Secondary | ICD-10-CM

## 2011-10-25 DIAGNOSIS — K7689 Other specified diseases of liver: Secondary | ICD-10-CM | POA: Diagnosis present

## 2011-10-25 DIAGNOSIS — D689 Coagulation defect, unspecified: Secondary | ICD-10-CM | POA: Diagnosis present

## 2011-10-25 DIAGNOSIS — E876 Hypokalemia: Secondary | ICD-10-CM | POA: Diagnosis not present

## 2011-10-25 DIAGNOSIS — K560 Paralytic ileus: Secondary | ICD-10-CM | POA: Diagnosis present

## 2011-10-25 DIAGNOSIS — K567 Ileus, unspecified: Secondary | ICD-10-CM | POA: Diagnosis present

## 2011-10-25 DIAGNOSIS — K56600 Partial intestinal obstruction, unspecified as to cause: Secondary | ICD-10-CM | POA: Diagnosis present

## 2011-10-25 DIAGNOSIS — K561 Intussusception: Secondary | ICD-10-CM

## 2011-10-25 DIAGNOSIS — K7460 Unspecified cirrhosis of liver: Secondary | ICD-10-CM | POA: Diagnosis present

## 2011-10-25 DIAGNOSIS — R42 Dizziness and giddiness: Secondary | ICD-10-CM

## 2011-10-25 HISTORY — DX: Malignant neoplasm of unspecified site of unspecified female breast: C50.919

## 2011-10-25 LAB — DIFFERENTIAL
Basophils Absolute: 0.1 10*3/uL (ref 0.0–0.1)
Basophils Relative: 1 % (ref 0–1)
Eosinophils Absolute: 0.3 10*3/uL (ref 0.0–0.7)
Eosinophils Relative: 5 % (ref 0–5)
Monocytes Absolute: 0.6 10*3/uL (ref 0.1–1.0)
Monocytes Relative: 9 % (ref 3–12)

## 2011-10-25 LAB — POCT I-STAT, CHEM 8
Calcium, Ion: 1.06 mmol/L — ABNORMAL LOW (ref 1.12–1.32)
Glucose, Bld: 129 mg/dL — ABNORMAL HIGH (ref 70–99)
HCT: 39 % (ref 36.0–46.0)
TCO2: 31 mmol/L (ref 0–100)

## 2011-10-25 LAB — CBC
HCT: 37.8 % (ref 36.0–46.0)
Hemoglobin: 13 g/dL (ref 12.0–15.0)
MCH: 29.1 pg (ref 26.0–34.0)
MCHC: 34.4 g/dL (ref 30.0–36.0)
MCV: 84.6 fL (ref 78.0–100.0)
Platelets: 156 10*3/uL (ref 150–400)
RBC: 4.47 MIL/uL (ref 3.87–5.11)
RDW: 14.6 % (ref 11.5–15.5)
WBC: 6.1 10*3/uL (ref 4.0–10.5)

## 2011-10-25 MED ORDER — METRONIDAZOLE IN NACL 5-0.79 MG/ML-% IV SOLN
500.0000 mg | Freq: Once | INTRAVENOUS | Status: AC
  Administered 2011-10-25: 500 mg via INTRAVENOUS
  Filled 2011-10-25: qty 100

## 2011-10-25 MED ORDER — ONDANSETRON HCL 4 MG/2ML IJ SOLN: 4.0000 mg | Freq: Three times a day (TID) | INTRAMUSCULAR | Status: AC | PRN

## 2011-10-25 MED ORDER — SODIUM CHLORIDE 0.9 % IV BOLUS (SEPSIS)
1000.0000 mL | Freq: Once | INTRAVENOUS | Status: AC
  Administered 2011-10-25: 1000 mL via INTRAVENOUS

## 2011-10-25 MED ORDER — CIPROFLOXACIN IN D5W 400 MG/200ML IV SOLN
400.0000 mg | Freq: Once | INTRAVENOUS | Status: AC
  Administered 2011-10-26: 400 mg via INTRAVENOUS
  Filled 2011-10-25: qty 200

## 2011-10-25 MED ORDER — POTASSIUM CHLORIDE 10 MEQ/100ML IV SOLN
10.0000 meq | Freq: Once | INTRAVENOUS | Status: AC
  Administered 2011-10-25: 10 meq via INTRAVENOUS
  Filled 2011-10-25: qty 100

## 2011-10-25 MED ORDER — SODIUM CHLORIDE 0.9 % IV SOLN
INTRAVENOUS | Status: AC
  Administered 2011-10-25: 23:00:00 via INTRAVENOUS

## 2011-10-25 NOTE — ED Provider Notes (Signed)
Medical screening examination/treatment/procedure(s) were performed by non-physician practitioner and as supervising physician I was immediately available for consultation/collaboration.   Dayton Bailiff, MD 10/25/11 2223

## 2011-10-25 NOTE — ED Notes (Signed)
The pt was seen at her doctors office today for abd pain for one week.  She was diagnosed with an intestinal blockage.  She has had 2  Years ago.  She had xrays that confirmed it

## 2011-10-25 NOTE — ED Notes (Signed)
She was given some type pain shot at that office

## 2011-10-25 NOTE — ED Notes (Signed)
Pt states that she started having problems with bowel movements on Wed. Pt states she went to her MD on Sat and was told she had a partial intestinal bockage due to xray. Pt states she received medications to help with her bowels and has been having bowel movement regularly. Pt also received pain medication at MD.

## 2011-10-25 NOTE — ED Provider Notes (Signed)
History     CSN: [id number]  Arrival date & time 10/25/11  1648   First MD Initiated Contact with Patient 10/25/11 2007      Chief Complaint  Patient presents with  . intestinal blockage     (Consider location/radiation/quality/duration/timing/severity/associated sxs/prior treatment) HPI  78 year old female presents with chief complaints of abdominal pain. Patient has been complaining of intermittent abdominal pain for the past week. States pain is most noticeable to the right upper quadrant. Pain is described as a sharp and stabbing sensation worsening with having bowel movement. Pain can last for seconds to minutes. Nothing seems to make the pain better. She sometimes noticed pain after eating but not always. She denies fever, chest pain, shortness of breath, nausea, vomiting, back pain, dysuria, rash. She denies any recent trauma. Patient denies prior abdominal surgery.  Able to pass flatus, her last bowel movement was 4 hours ago. She did presents to the primary care doctor today for complaint, and her pain was thought to be due to a possible bowel obstruction. An x-ray performed confirmed of possible small bowel destruction. Patient states she did have a positive Hemoccult , done at the office. She denies having bright red blood per rectum. She was recommended to come to the ED for further evaluation.    Past Medical History  Diagnosis Date  . Coronary artery disease   . CVA (cerebral vascular accident)   . GERD (gastroesophageal reflux disease)   . Hypertension   . Femoral neck fracture 12/2008    right  . Asthma   . Hyperlipidemia     Past Surgical History  Procedure Date  . Hemiarthroplasty hip   . Cardiac catheterization     Family History  Problem Relation Age of Onset  . Heart attack Son     x 2    History  Substance Use Topics  . Smoking status: Never Smoker   . Smokeless tobacco: Never Used  . Alcohol Use: No    OB History    Grav Para Term Preterm  Abortions TAB SAB Ect Mult Living                  Review of Systems  All other systems reviewed and are negative.    Allergies  Codeine  Home Medications   Current Outpatient Rx  Name Route Sig Dispense Refill  . FLUOCINONIDE 0.05 % EX CREA Topical Apply 1 application topically 2 (two) times daily. To affected area    . MONTELUKAST SODIUM 10 MG PO TABS Oral Take 10 mg by mouth at bedtime.     Marland Kitchen NITROGLYCERIN 0.4 MG SL SUBL Sublingual Place 0.4 mg under the tongue every 5 (five) minutes as needed. Chest pain    . OMEPRAZOLE 20 MG PO CPDR Oral Take 20 mg by mouth daily.     Marland Kitchen POTASSIUM CHLORIDE 10 MEQ PO TBCR Oral Take 10 mEq by mouth 2 (two) times daily.      . TORSEMIDE 20 MG PO TABS Oral Take 20 mg by mouth 2 (two) times daily as needed. For swelling      BP 158/68  Pulse 89  Temp(Src) 98.2 F (36.8 C) (Oral)  Resp 20  SpO2 95%  Physical Exam  Nursing note and vitals reviewed. Constitutional: She is oriented to person, place, and time. She appears well-developed and well-nourished. No distress.  HENT:  Head: Atraumatic.  Eyes: Conjunctivae are normal.  Neck: Neck supple.  Cardiovascular: Normal rate and regular rhythm.  Pulmonary/Chest: Effort normal and breath sounds normal. She exhibits no tenderness.  Abdominal: She exhibits distension. There is tenderness in the right upper quadrant. There is no rebound, no guarding, no tenderness at McBurney's point and negative Murphy's sign. No hernia. Hernia confirmed negative in the ventral area.  Neurological: She is alert and oriented to person, place, and time.  Skin: Skin is warm.    ED Course  Procedures (including critical care time)  Labs Reviewed - No data to display Dg Abd 2 Views  10/25/2011  *RADIOLOGY REPORT*  Clinical Data: Abdominal pain and bloating.  ABDOMEN - 2 VIEW  Comparison: None.  Findings: There are mildly dilated loops of small bowel in the left abdomen with a relative paucity of gas elsewhere.   Some air fluid levels are seen in association. Right hip arthroplasty.  IMPRESSION: Dilated small bowel loops in the left abdomen with a relative paucity of gas elsewhere.  Findings are nonspecific.  Difficult to exclude partial or early small bowel obstruction.  Original Report Authenticated By: Reyes Ivan, M.D.     No diagnosis found.   Date: 10/25/2011  Rate: 89  Rhythm: normal sinus rhythm  QRS Axis: normal  Intervals: QT prolonged  ST/T Wave abnormalities: nonspecific ST/T changes  Conduction Disutrbances:first-degree A-V block   Narrative Interpretation:   Old EKG Reviewed: unchanged  Results for orders placed during the hospital encounter of 10/25/11  CBC      Component Value Range   WBC 6.1  4.0 - 10.5 (K/uL)   RBC 4.47  3.87 - 5.11 (MIL/uL)   Hemoglobin 13.0  12.0 - 15.0 (g/dL)   HCT 91.4  78.2 - 95.6 (%)   MCV 84.6  78.0 - 100.0 (fL)   MCH 29.1  26.0 - 34.0 (pg)   MCHC 34.4  30.0 - 36.0 (g/dL)   RDW 21.3  08.6 - 57.8 (%)   Platelets 156  150 - 400 (K/uL)  DIFFERENTIAL      Component Value Range   Neutrophils Relative 58  43 - 77 (%)   Neutro Abs 3.5  1.7 - 7.7 (K/uL)   Lymphocytes Relative 26  12 - 46 (%)   Lymphs Abs 1.6  0.7 - 4.0 (K/uL)   Monocytes Relative 9  3 - 12 (%)   Monocytes Absolute 0.6  0.1 - 1.0 (K/uL)   Eosinophils Relative 5  0 - 5 (%)   Eosinophils Absolute 0.3  0.0 - 0.7 (K/uL)   Basophils Relative 1  0 - 1 (%)   Basophils Absolute 0.1  0.0 - 0.1 (K/uL)  POCT I-STAT, CHEM 8      Component Value Range   Sodium 140  135 - 145 (mEq/L)   Potassium 2.7 (*) 3.5 - 5.1 (mEq/L)   Chloride 99  96 - 112 (mEq/L)   BUN 12  6 - 23 (mg/dL)   Creatinine, Ser 4.69  0.50 - 1.10 (mg/dL)   Glucose, Bld 629 (*) 70 - 99 (mg/dL)   Calcium, Ion 5.28 (*) 1.12 - 1.32 (mmol/L)   TCO2 31  0 - 100 (mmol/L)   Hemoglobin 13.3  12.0 - 15.0 (g/dL)   HCT 41.3  24.4 - 01.0 (%)   Dg Abd 2 Views  10/25/2011  *RADIOLOGY REPORT*  Clinical Data: Abdominal pain and  bloating.  ABDOMEN - 2 VIEW  Comparison: None.  Findings: There are mildly dilated loops of small bowel in the left abdomen with a relative paucity of gas elsewhere.  Some air  fluid levels are seen in association. Right hip arthroplasty.  IMPRESSION: Dilated small bowel loops in the left abdomen with a relative paucity of gas elsewhere.  Findings are nonspecific.  Difficult to exclude partial or early small bowel obstruction.  Original Report Authenticated By: Reyes Ivan, M.D.      MDM  Evidence of possible SBO.  I discussed with my attending.  Will call [facility] for admission.  Pt currently in NAD, with stable normal VS.     10:04 PM Patient has a potassium level of 2.7, however the EKG shows no significant changes. Supplementation given via IV. Patient is made n.p.o. Currently awaiting to hear back from [facility] hospitalist for admission.  10:11 PM I have consulted with my attending and also with Dr. Conley Rolls from [facility] hospitalist.  Pt will be admitted for possible diverticulitis and for hypokalemia.  I was recommended to start cipro/flagyl abx.  Pt to be admitted to [facility] bed, Team 7, under the care of Dr. Venetia Constable.  Pt voice understanding and agrees with plan.     Fayrene Helper, PA-C 10/25/11 2215

## 2011-10-26 ENCOUNTER — Inpatient Hospital Stay (HOSPITAL_COMMUNITY): Payer: Medicare Other

## 2011-10-26 ENCOUNTER — Encounter (HOSPITAL_COMMUNITY): Payer: Self-pay | Admitting: *Deleted

## 2011-10-26 LAB — CBC
HCT: 34.4 % — ABNORMAL LOW (ref 36.0–46.0)
Hemoglobin: 11.8 g/dL — ABNORMAL LOW (ref 12.0–15.0)
MCH: 29.4 pg (ref 26.0–34.0)
MCHC: 34.3 g/dL (ref 30.0–36.0)
MCV: 85.8 fL (ref 78.0–100.0)
RDW: 14.7 % (ref 11.5–15.5)

## 2011-10-26 LAB — BASIC METABOLIC PANEL
BUN: 11 mg/dL (ref 6–23)
BUN: 9 mg/dL (ref 6–23)
Calcium: 8.6 mg/dL (ref 8.4–10.5)
Creatinine, Ser: 0.79 mg/dL (ref 0.50–1.10)
Creatinine, Ser: 0.74 mg/dL (ref 0.50–1.10)
GFR calc Af Amer: 89 mL/min — ABNORMAL LOW (ref >90)
GFR calc Af Amer: >90 mL/min (ref >90)
GFR calc non Af Amer: 77 mL/min — ABNORMAL LOW (ref >90)
Glucose, Bld: 94 mg/dL (ref 70–99)

## 2011-10-26 LAB — MAGNESIUM: Magnesium: 1.7 mg/dL (ref 1.5–2.5)

## 2011-10-26 MED ORDER — ACETAMINOPHEN 325 MG PO TABS
650.0000 mg | ORAL_TABLET | Freq: Four times a day (QID) | ORAL | Status: DC | PRN
  Administered 2011-10-26: 650 mg via ORAL
  Filled 2011-10-26: qty 2

## 2011-10-26 MED ORDER — ONDANSETRON HCL 4 MG/2ML IJ SOLN
4.0000 mg | Freq: Four times a day (QID) | INTRAMUSCULAR | Status: DC | PRN
  Administered 2011-10-30: 4 mg via INTRAVENOUS
  Filled 2011-10-26: qty 2

## 2011-10-26 MED ORDER — PANTOPRAZOLE SODIUM 40 MG PO TBEC
40.0000 mg | DELAYED_RELEASE_TABLET | Freq: Every day | ORAL | Status: DC
  Administered 2011-10-26 – 2011-10-28 (×3): 40 mg via ORAL
  Filled 2011-10-26 (×2): qty 1
  Filled 2011-10-26: qty 2

## 2011-10-26 MED ORDER — MORPHINE SULFATE 2 MG/ML IJ SOLN
0.5000 mg | INTRAMUSCULAR | Status: DC | PRN
  Administered 2011-10-29 – 2011-11-03 (×13): 0.5 mg via INTRAVENOUS
  Filled 2011-10-26 (×13): qty 1

## 2011-10-26 MED ORDER — IOHEXOL 300 MG/ML  SOLN
80.0000 mL | Freq: Once | INTRAMUSCULAR | Status: AC | PRN
  Administered 2011-10-26: 80 mL via INTRAVENOUS

## 2011-10-26 MED ORDER — ENOXAPARIN SODIUM 40 MG/0.4ML ~~LOC~~ SOLN
40.0000 mg | SUBCUTANEOUS | Status: DC
  Administered 2011-10-26 – 2011-10-27 (×2): 40 mg via SUBCUTANEOUS
  Filled 2011-10-26 (×3): qty 0.4

## 2011-10-26 MED ORDER — ONDANSETRON HCL 4 MG PO TABS: 4.0000 mg | ORAL_TABLET | Freq: Four times a day (QID) | ORAL | Status: DC | PRN

## 2011-10-26 MED ORDER — MORPHINE SULFATE 2 MG/ML IJ SOLN: 1.0000 mg | INTRAMUSCULAR | Status: DC | PRN

## 2011-10-26 MED ORDER — POTASSIUM CHLORIDE 10 MEQ/100ML IV SOLN
10.0000 meq | INTRAVENOUS | Status: AC
Start: 2011-10-26 — End: 2011-10-26
  Administered 2011-10-26 (×4): 10 meq via INTRAVENOUS
  Filled 2011-10-26 (×4): qty 100

## 2011-10-26 MED ORDER — OXYCODONE-ACETAMINOPHEN 5-325 MG PO TABS: 1.0000 | ORAL_TABLET | ORAL | Status: DC | PRN

## 2011-10-26 MED ORDER — MORPHINE SULFATE 2 MG/ML IJ SOLN: 0.5000 mg | INTRAMUSCULAR | Status: DC | PRN

## 2011-10-26 MED ORDER — SODIUM CHLORIDE 0.9 % IJ SOLN
3.0000 mL | Freq: Two times a day (BID) | INTRAMUSCULAR | Status: DC
  Administered 2011-10-27 (×2): 3 mL via INTRAVENOUS

## 2011-10-26 MED ORDER — KCL IN DEXTROSE-NACL 40-5-0.9 MEQ/L-%-% IV SOLN
INTRAVENOUS | Status: DC
  Administered 2011-10-26 (×3): via INTRAVENOUS
  Filled 2011-10-26 (×3): qty 1000

## 2011-10-26 MED ORDER — POTASSIUM CHLORIDE 10 MEQ/100ML IV SOLN
10.0000 meq | INTRAVENOUS | Status: AC
  Administered 2011-10-26 (×2): 10 meq via INTRAVENOUS
  Filled 2011-10-26 (×4): qty 100

## 2011-10-26 MED ORDER — FLUOCINONIDE 0.05 % EX CREA
1.0000 "application " | TOPICAL_CREAM | Freq: Two times a day (BID) | CUTANEOUS | Status: DC
  Administered 2011-10-26 – 2011-11-03 (×12): 1 via TOPICAL
  Filled 2011-10-26 (×3): qty 30

## 2011-10-26 MED ORDER — POTASSIUM CHLORIDE CRYS ER 20 MEQ PO TBCR
20.0000 meq | EXTENDED_RELEASE_TABLET | Freq: Two times a day (BID) | ORAL | Status: DC
  Administered 2011-10-26: 20 meq via ORAL
  Filled 2011-10-26 (×5): qty 1

## 2011-10-26 MED ORDER — MONTELUKAST SODIUM 10 MG PO TABS
10.0000 mg | ORAL_TABLET | Freq: Every day | ORAL | Status: DC
  Administered 2011-10-26 – 2011-10-27 (×2): 10 mg via ORAL
  Filled 2011-10-26 (×3): qty 1

## 2011-10-26 MED ORDER — ZOLPIDEM TARTRATE 5 MG PO TABS
5.0000 mg | ORAL_TABLET | Freq: Every evening | ORAL | Status: DC | PRN
  Administered 2011-10-26 – 2011-11-02 (×4): 5 mg via ORAL
  Filled 2011-10-26 (×4): qty 1

## 2011-10-26 MED ORDER — IOHEXOL 300 MG/ML  SOLN
20.0000 mL | INTRAMUSCULAR | Status: AC
  Administered 2011-10-26 (×2): 20 mL via ORAL

## 2011-10-26 NOTE — ED Notes (Signed)
Called and gave report to [place].

## 2011-10-26 NOTE — Evaluation (Signed)
Physical Therapy Evaluation Patient Details Name: [name] MRN: [id number] DOB: [birth year] Today's Date: 10/26/2011  Problem List:  Patient Active Problem List  Diagnoses  . HYPERLIPIDEMIA-MIXED  . DIZZINESS  . EDEMA  . DYSPNEA  . CHEST PAIN-UNSPECIFIED  . CAD (coronary artery disease)  . SBO (small bowel obstruction)    Past Medical History:  Past Medical History  Diagnosis Date  . Coronary artery disease   . CVA (cerebral vascular accident)   . GERD (gastroesophageal reflux disease)   . Hypertension   . Femoral neck fracture 12/2008    right  . Asthma   . Hyperlipidemia    Past Surgical History:  Past Surgical History  Procedure Date  . Hemiarthroplasty hip   . Cardiac catheterization     PT Assessment/Plan/Recommendation PT Assessment Clinical Impression Statement: Pt adm with SBO.  Pt with long history of falls at home.  From PT standpoint pt should be able to return home with husband. PT Recommendation/Assessment: Patient will need skilled PT in the acute care venue PT Problem List: Decreased strength;Decreased activity tolerance;Decreased balance;Decreased mobility PT Therapy Diagnosis : Difficulty walking;Generalized weakness PT Plan PT Frequency: Min 3X/week PT Treatment/Interventions: DME instruction;Gait training;Functional mobility training;Therapeutic activities;Therapeutic exercise;Balance training;Patient/family education PT Recommendation Follow Up Recommendations: No PT follow up Equipment Recommended: None recommended by PT PT Goals  Acute Rehab PT Goals PT Goal Formulation: With patient Time For Goal Achievement: 7 days Pt will go Supine/Side to Sit: with modified independence PT Goal: Supine/Side to Sit - Progress: Goal set today Pt will go Sit to Supine/Side: with modified independence PT Goal: Sit to Supine/Side - Progress: Goal set today Pt will go Sit to Stand: with modified independence PT Goal: Sit to Stand - Progress: Goal set  today Pt will go Stand to Sit: with modified independence PT Goal: Stand to Sit - Progress: Goal set today Pt will Ambulate: >150 feet;with modified independence;with least restrictive assistive device PT Goal: Ambulate - Progress: Goal set today  PT Evaluation Precautions/Restrictions  Precautions Precautions: Fall Prior Functioning  Home Living Lives With: Spouse Available Help at Discharge: Family Type of Home: House Home Access: Ramped entrance Home Layout: One level Bathroom Shower/Tub: Heritage manager Accessibility: Yes How Accessible: Accessible via walker Home Adaptive Equipment: Bedside commode/3-in-1;Walker - rolling;Wheelchair - powered Prior Function Level of Independence: Independent with assistive device(s) (amb with rolling walker) Driving: No Vocation: Retired Producer, television/film/video: Awake/alert Overall Cognitive Status: Appears within functional limits for tasks assessed Sensation/Coordination   Extremity Assessment RLE Strength RLE Overall Strength Comments: grossly 4/5 LLE Strength LLE Overall Strength Comments: grossly 4/5 Mobility (including Balance) Bed Mobility Bed Mobility: Yes Supine to Sit: 5: Supervision;HOB elevated (Comment degrees) (HOB 30 degrees) Sitting - Scoot to Edge of Bed: 5: Supervision Sitting - Scoot to [place] of Bed Details (indicate cue type and reason): Incr time Transfers Transfers: Yes Sit to Stand: From bed;5: Supervision;With upper extremity assist Stand to Sit: To chair/3-in-1;5: Supervision;With upper extremity assist;With armrests Ambulation/Gait Ambulation/Gait: Yes Ambulation/Gait Assistance: 5: Supervision Ambulation Distance (Feet): 200 Feet Assistive device: Rolling walker Gait Pattern: Decreased step length - right;Decreased step length - left;Trunk flexed Gait velocity: slow cadence  Static Standing Balance Static Standing - Balance Support: Bilateral upper extremity supported (on  walker) Static Standing - Level of Assistance: 5: Stand by assistance Exercise    End of Session PT - End of Session Equipment Utilized During Treatment: Gait belt Activity Tolerance: Patient tolerated treatment well Patient left: in chair;with call bell  in reach;with family/visitor present General Behavior During Session: [facility] for tasks performed Cognition: [facility] for tasks performed  [facility] 10/26/2011, 1:41 PM  [facility] PT [phone]

## 2011-10-26 NOTE — H&P (Signed)
PCP:   [name], MD, MD   Chief Complaint: Abdominal pain and bloating.   HPI: [name] is an 78 y.o. female with no prior abdominal surgery, history of coronary disease, prior CVA, hypertension, hyperlipidemia, presents with increased abdominal girth , abdominal cramps and pain diffusely, with mild nausea but no vomiting, and sent to the emergency room from her primary care physician's office. She did have a bowel movement earlier today. She denied any black stool bloody stool. She has no fever or chills. Evaluation included a plain x-ray which showed dilated bowel consistent with possible early obstruction. Her laboratory study showed no elevation of white count and normal hemoglobin, potassium is low at 2.7 mEq per liter. She has normal renal function tests. Hospitalist was  asked to admit her for possible partial small bowel obstruction.    Rewiew of Systems:  The patient denies anorexia, fever, weight loss,, vision loss, decreased hearing, hoarseness, chest pain, syncope, dyspnea on exertion, peripheral edema, balance deficits, hemoptysis,  melena, hematochezia, severe indigestion/heartburn, hematuria, incontinence, genital sores, muscle weakness, suspicious skin lesions, transient blindness, difficulty walking, depression, unusual weight change, abnormal bleeding, enlarged lymph nodes, angioedema, and breast masses.   Past Medical History  Diagnosis Date  . Coronary artery disease   . CVA (cerebral vascular accident)   . GERD (gastroesophageal reflux disease)   . Hypertension   . Femoral neck fracture 12/2008    right  . Asthma   . Hyperlipidemia     Past Surgical History  Procedure Date  . Hemiarthroplasty hip   . Cardiac catheterization     Medications:  HOME MEDS: Prior to Admission medications   Medication Sig Start Date End Date Taking? Authorizing Provider  fluocinonide cream (LIDEX) 0.05 % Apply 1 application topically 2 (two) times daily. To affected area  08/18/11  Yes Historical Provider, MD  montelukast (SINGULAIR) 10 MG tablet Take 10 mg by mouth at bedtime.    Yes Historical Provider, MD  nitroGLYCERIN (NITROSTAT) 0.4 MG SL tablet Place 0.4 mg under the tongue every 5 (five) minutes as needed. Chest pain 09/21/11  Yes Antonieta Iba, MD  omeprazole (PRILOSEC) 20 MG capsule Take 20 mg by mouth daily.    Yes Historical Provider, MD  potassium chloride (KLOR-CON) 10 MEQ CR tablet Take 10 mEq by mouth 2 (two) times daily.     Yes Historical Provider, MD  torsemide (DEMADEX) 20 MG tablet Take 20 mg by mouth 2 (two) times daily as needed. For swelling 09/21/11 09/20/12 Yes Antonieta Iba, MD     Allergies:  Allergies  Allergen Reactions  . Codeine     REACTION: Any derevative ----  Becomes unresponsive    Social History:   reports that she has never smoked. She has never used smokeless tobacco. She reports that she does not drink alcohol or use illicit drugs.  Family History: Family History  Problem Relation Age of Onset  . Heart attack Son     x 2     Physical Exam: Filed Vitals:   10/25/11 1654 10/25/11 2220 10/26/11 0154  BP: 158/68 142/71 140/62  Pulse: 89 91 89  Temp: 98.2 F (36.8 C)  98.3 F (36.8 C)  TempSrc: Oral  Oral  Resp: 20 16 18   SpO2: 95% 96% 96%   Blood pressure 140/62, pulse 89, temperature 98.3 F (36.8 C), temperature source Oral, resp. rate 18, SpO2 96.00%.  GEN:  Pleasant person lying in the stretcher in no acute distress; cooperative with exam  PSYCH:  alert and oriented x4; does not appear anxious or depressed; affect is appropriate. HEENT: Mucous membranes pink and anicteric; PERRLA; EOM intact; no cervical lymphadenopathy nor thyromegaly or carotid bruit; no JVD; Breasts:: Not examined CHEST WALL: No tenderness CHEST: Normal respiration, clear to auscultation bilaterally HEART: Regular rate and rhythm; no murmurs rubs or gallops BACK: No kyphosis or scoliosis; no CVA tenderness ABDOMEN: Obese,  soft non-tender; no masses, no organomegaly, normal abdominal bowel sounds; no pannus; no intertriginous candida. Abdomen is hypertympanic with distention, it is not tense. No rebound Rectal Exam: Not done EXTREMITIES: No bone or joint deformity; age-appropriate arthropathy of the hands and knees; no edema; no ulcerations. Genitalia: not examined PULSES: 2+ and symmetric SKIN: Normal hydration no rash or ulceration CNS: Cranial nerves 2-12 grossly intact no focal lateralizing neurologic deficit   Labs & Imaging Results for orders placed during the hospital encounter of 10/25/11 (from the past 48 hour(s))  CBC     Status: Normal   Collection Time   10/25/11  8:46 PM      Component Value Range Comment   WBC 6.1  4.0 - 10.5 (K/uL)    RBC 4.47  3.87 - 5.11 (MIL/uL)    Hemoglobin 13.0  12.0 - 15.0 (g/dL)    HCT 30.8  65.7 - 84.6 (%)    MCV 84.6  78.0 - 100.0 (fL)    MCH 29.1  26.0 - 34.0 (pg)    MCHC 34.4  30.0 - 36.0 (g/dL)    RDW 96.2  95.2 - 84.1 (%)    Platelets 156  150 - 400 (K/uL)   DIFFERENTIAL     Status: Normal   Collection Time   10/25/11  8:46 PM      Component Value Range Comment   Neutrophils Relative 58  43 - 77 (%)    Neutro Abs 3.5  1.7 - 7.7 (K/uL)    Lymphocytes Relative 26  12 - 46 (%)    Lymphs Abs 1.6  0.7 - 4.0 (K/uL)    Monocytes Relative 9  3 - 12 (%)    Monocytes Absolute 0.6  0.1 - 1.0 (K/uL)    Eosinophils Relative 5  0 - 5 (%)    Eosinophils Absolute 0.3  0.0 - 0.7 (K/uL)    Basophils Relative 1  0 - 1 (%)    Basophils Absolute 0.1  0.0 - 0.1 (K/uL)   POCT I-STAT, CHEM 8     Status: Abnormal   Collection Time   10/25/11  8:57 PM      Component Value Range Comment   Sodium 140  135 - 145 (mEq/L)    Potassium 2.7 (*) 3.5 - 5.1 (mEq/L)    Chloride 99  96 - 112 (mEq/L)    BUN 12  6 - 23 (mg/dL)    Creatinine, Ser 3.24  0.50 - 1.10 (mg/dL)    Glucose, Bld 401 (*) 70 - 99 (mg/dL)    Calcium, Ion 0.27 (*) 1.12 - 1.32 (mmol/L)    TCO2 31  0 - 100 (mmol/L)     Hemoglobin 13.3  12.0 - 15.0 (g/dL)    HCT 25.3  66.4 - 40.3 (%)    Dg Abd 2 Views  10/25/2011  *RADIOLOGY REPORT*  Clinical Data: Abdominal pain and bloating.  ABDOMEN - 2 VIEW  Comparison: None.  Findings: There are mildly dilated loops of small bowel in the left abdomen with a relative paucity of gas elsewhere.  Some air fluid  levels are seen in association. Right hip arthroplasty.  IMPRESSION: Dilated small bowel loops in the left abdomen with a relative paucity of gas elsewhere.  Findings are nonspecific.  Difficult to exclude partial or early small bowel obstruction.  Original Report Authenticated By: Reyes Ivan, M.D.      Assessment Present on Admission:  .SBO (small bowel obstruction) .HYPERLIPIDEMIA-MIXED .CAD (coronary artery disease)   PLAN: I do suspect she has early small bowel obstruction. She did have a bowel movement today, and her abdominal exam is nonacute. She also has very low potassium which can also give her an ileus. Will replete her potassium aggressively. We'll place her on n.p.o. and followup abdominal series tomorrow. Originally, I thought the presentation show left lower quadrant pain, but after reviewing the chart and examined her, I do not think she has diverticulitis. I would not continue her antibiotics. She is stable, full code, and will be admitted to triad hospitalist service. Will obviously give her some IV fluid and antiemetics.   Other plans as per orders.    Giah Fickett 10/26/2011, 1:59 AM

## 2011-10-26 NOTE — Progress Notes (Signed)
Doctor notified of patient's double mastectomy around 12 years ago, needed clarification on restricted limb band.  Doctor stated should be all right to stick either arm or take blood pressure in upper extremities since the length of surgery has been 12 years.  Will continue to monitor.  Macarthur Critchley, RN

## 2011-10-26 NOTE — Progress Notes (Signed)
Patient arrived from ED via stretcher, denies pain at this time.  Abdomen distended and tender, placed on telemetry, oriented to room and call bell with in reach.  From home with husband, Patient has had double mastectomy in past and admit tele strip was first degree HB.  Patient receiving 3 runs of K+, antibiotic, and NPO at this time.  Patient is HOH but does read lips, she has hearing aides in room.  Will continue to monitor.  Macarthur Critchley, RN

## 2011-10-26 NOTE — ED Notes (Signed)
Per Albin Felling, the patient cannot go to her floor since the patient requires tele monitoring.

## 2011-10-26 NOTE — ED Notes (Signed)
Advised receiving RN of addendum note at 0133.

## 2011-10-26 NOTE — Progress Notes (Addendum)
Patient ID: Tynisa Vohs, female   DOB: June 14, 1929, 78 y.o.   MRN: [id number] PATIENT DETAILS Name: [name] Age: 78 y.o. Sex: female Date of Birth: 1929/02/27 Admit Date: 10/25/2011 WUJ:[id number],NWGNF, MD, MD   Patient admitted with abdominal distention and severe pain.  No vomiting.  Potassium was 2.7.  Abdominal xray improving.  Subjective: Starting to tolerate clears.  Wants to go home.  History of double mastectomy from BRCA.  Objective: Weight change:   Intake/Output Summary (Last 24 hours) at 10/26/11 1505 Last data filed at 10/26/11 1332  Gross per 24 hour  Intake    480 ml  Output    250 ml  Net    230 ml   Blood pressure 91/58, pulse 89, temperature 97.3 F (36.3 C), temperature source Oral, resp. rate 20, height 5\' 2"  (1.575 m), weight 61.6 kg (135 lb 12.9 oz), SpO2 95.00%. Filed Vitals:   10/26/11 0234 10/26/11 0547 10/26/11 1155 10/26/11 1331  BP: 148/80 125/72 135/78 91/58  Pulse: 91 85 102 89  Temp: 97.8 F (36.6 C) 97.5 F (36.4 C)  97.3 F (36.3 C)  TempSrc: Oral Oral  Oral  Resp: 18 20  20   Height: 5\' 2"  (1.575 m)     Weight: 61.6 kg (135 lb 12.9 oz)     SpO2: 93% 92% 95% 95%    Physical Exam: General: No acute distress Lungs: Clear to auscultation bilaterally without wheezes or crackles Cardiovascular: Regular rate and rhythm without murmur gallop or rub normal S1 and S2 Abdomen: Distended (protuberant).  Non tender. Very few bowel sounds. Extremities: No significant cyanosis, clubbing, or edema bilateral lower extremities  Basic Metabolic Panel:  Lab 10/26/11 6213 10/25/11 2057  NA 139 140  K 2.8* 2.7*  CL 101 99  CO2 29 --  GLUCOSE 94 129*  BUN 11 12  CREATININE 0.79 0.90  CALCIUM 8.2* --  MG -- --  PHOS -- --   CBC:  Lab 10/26/11 0241 10/25/11 2057 10/25/11 2046  WBC 3.9* -- 6.1  NEUTROABS -- -- 3.5  HGB 11.8* 13.3 --  HCT 34.4* 39.0 --  MCV 85.8 -- 84.6  PLT 126* -- 156    Studies/Results: Scheduled Meds:   .  sodium chloride   Intravenous STAT  . ciprofloxacin  400 mg Intravenous Once  . enoxaparin  40 mg Subcutaneous Q24H  . fluocinonide cream  1 application Topical BID  . metronidazole  500 mg Intravenous Once  . montelukast  10 mg Oral QHS  . pantoprazole  40 mg Oral Q1200  . potassium chloride  10 mEq Intravenous Once  . potassium chloride  10 mEq Intravenous Q1 Hr x 3  . potassium chloride  10 mEq Intravenous Q1 Hr x 4  . potassium chloride  20 mEq Oral BID  . sodium chloride  1,000 mL Intravenous Once  . sodium chloride  3 mL Intravenous Q12H   Continuous Infusions:   . dextrose 5 % and 0.9 % NaCl with KCl 40 mEq/L 75 mL/hr at 10/26/11 0545   PRN Meds:.ondansetron (ZOFRAN) IV, ondansetron (ZOFRAN) IV, ondansetron, zolpidem  Anti-infectives:  Anti-infectives     Start     Dose/Rate Route Frequency Ordered Stop   10/25/11 2215   metroNIDAZOLE (FLAGYL) IVPB 500 mg        500 mg 100 mL/hr over 60 Minutes Intravenous  Once 10/25/11 2213 10/26/11 0015   10/25/11 2215   ciprofloxacin (CIPRO) IVPB 400 mg  400 mg 200 mL/hr over 60 Minutes Intravenous  Once 10/25/11 2213 10/26/11 0359          Assessment/Plan: Principal Problem:  *SBO (small bowel obstruction) Active Problems:  HYPERLIPIDEMIA-MIXED  CAD (coronary artery disease)    Partial SBO.  Xray improved today.  Starting clear liquids.  Will replete K, check mag.  If patient continues to improve she will not need a surgical or GI consult.  If she backslides would order CT Abd/Pelvis tomorrow.  Hypokalemia.  Contributing factor to SBO.  Will replete, check mag.  Check bmet in am.  CAD.  Quiet and Stable during this admission.  Disposition.  Home with husband when medically ready.  DVT Prophylaxis. lovenox   LOS: 1 day  Stephani Police 10/26/2011, 3:05 PM [phone]   Attending I have seen and examined the patient, I agree with the assessment and plan. Unfortunately she again has had recurrence of her  abdominal pain, she claims her abdomen is slightly distented. She apparently has had weight loss as well. Since her XRay of the abdomen is much better, but her belly is distended-i will go ahead and get a CT Abdomen. In interim will advance diet as tolerated. Patient claims that in the past she has had no issues with Morphine, so will start as needed for severe pain  Dr Windell Norfolk

## 2011-10-26 NOTE — Progress Notes (Signed)
   CARE MANAGEMENT NOTE 10/26/2011  Patient:  [name], [name]   Account Number:  [id number]  Date Initiated:  10/26/2011  Documentation initiated by:  Letha Cape  Subjective/Objective Assessment:   dx sbo  admit- lives with spouse.     Action/Plan:   pt eval- no pt f/u   Anticipated DC Date:  10/27/2011   Anticipated DC Plan:  HOME/SELF CARE      DC Planning Services  CM consult      Choice offered to / List presented to:             Status of service:  In process, will continue to follow Medicare Important Message given?   (If response is "NO", the following Medicare IM given date [place] will be blank) Date Medicare IM given:   Date Additional Medicare IM given:    Discharge Disposition:    Per UR Regulation:    If discussed at Long Length of Stay Meetings, dates discussed:    Comments:  10/26/11 16:42 Letha Cape RN, BSN [phone] patient lives with spouse, per physical therapy pt has not pt needs.  NCM will continue to follow for dc needs. Patient has medication coverage and transportation.

## 2011-10-26 NOTE — ED Notes (Addendum)
Called and gave report to [facility].

## 2011-10-26 NOTE — ED Notes (Addendum)
CORRECTION:  IV POTASSIUM LISTED AS NOT GIVEN AT 0133 WAS GIVEN.

## 2011-10-26 NOTE — Progress Notes (Signed)
Utilization review completed.  

## 2011-10-27 ENCOUNTER — Encounter (HOSPITAL_COMMUNITY): Payer: Self-pay | Admitting: General Surgery

## 2011-10-27 ENCOUNTER — Inpatient Hospital Stay (HOSPITAL_COMMUNITY): Payer: Medicare Other

## 2011-10-27 DIAGNOSIS — I2510 Atherosclerotic heart disease of native coronary artery without angina pectoris: Secondary | ICD-10-CM

## 2011-10-27 DIAGNOSIS — R188 Other ascites: Secondary | ICD-10-CM | POA: Diagnosis present

## 2011-10-27 DIAGNOSIS — Z01810 Encounter for preprocedural cardiovascular examination: Secondary | ICD-10-CM

## 2011-10-27 LAB — CBC
HCT: 36 % (ref 36.0–46.0)
MCHC: 34.7 g/dL (ref 30.0–36.0)
MCV: 86.1 fL (ref 78.0–100.0)
Platelets: 144 10*3/uL — ABNORMAL LOW (ref 150–400)
RDW: 14.9 % (ref 11.5–15.5)
WBC: 4.7 10*3/uL (ref 4.0–10.5)

## 2011-10-27 LAB — BASIC METABOLIC PANEL
BUN: 7 mg/dL (ref 6–23)
CO2: 24 mEq/L (ref 19–32)
Calcium: 8.6 mg/dL (ref 8.4–10.5)
Chloride: 105 mEq/L (ref 96–112)
Creatinine, Ser: 0.71 mg/dL (ref 0.50–1.10)
GFR calc Af Amer: >90 mL/min (ref >90)

## 2011-10-27 LAB — HEPATIC FUNCTION PANEL
AST: 24 U/L (ref 0–37)
Bilirubin, Direct: 0.3 mg/dL (ref 0.0–0.3)
Total Bilirubin: 1.3 mg/dL — ABNORMAL HIGH (ref 0.3–1.2)

## 2011-10-27 MED ORDER — BOOST / RESOURCE BREEZE PO LIQD
1.0000 | Freq: Three times a day (TID) | ORAL | Status: DC
  Administered 2011-10-27 – 2011-10-31 (×8): 1 via ORAL

## 2011-10-27 MED ORDER — ISOSORBIDE MONONITRATE ER 30 MG PO TB24
30.0000 mg | ORAL_TABLET | Freq: Every day | ORAL | Status: DC
  Administered 2011-10-27 – 2011-11-03 (×7): 30 mg via ORAL
  Filled 2011-10-27 (×8): qty 1

## 2011-10-27 MED ORDER — LORAZEPAM 0.5 MG PO TABS
0.5000 mg | ORAL_TABLET | Freq: Four times a day (QID) | ORAL | Status: DC | PRN
  Administered 2011-10-27 – 2011-10-31 (×3): 0.5 mg via ORAL
  Filled 2011-10-27 (×3): qty 1

## 2011-10-27 NOTE — Progress Notes (Signed)
OT Cancellation Note  Treatment cancelled today due to patient's refusal to participate due to fatigue.  Pt also verbalizing concern and anxiousness about meeting with doctor later this afternoon.  Will re-attempt tomorrow.    10/27/2011 Cipriano Mile OTR/L Pager [phone] Office [phone]

## 2011-10-27 NOTE — Progress Notes (Signed)
Addendum  Patient seen and examined, chart and data base reviewed.  I agree with the above assessment and plan  For full details please see Mrs. Algis Downs PA. Note.  Clint Lipps Pager: [phone] 10/27/2011, 5:19 PM

## 2011-10-27 NOTE — Consult Note (Signed)
CARDIOLOGY CONSULT NOTE  Patient ID: Lile Mccurley, MRN: [id number], DOB/AGE: 10/22/1928 78 y.o. Admit date: 10/25/2011 Date of Consult: 10/27/2011  Primary Physician: [name], MD, MD Primary Cardiologist: Dr. Mariah Milling  Reason for Consultation: Preoperative Clearance, History of CAD   HPI: 78 y.o. female w/ PMHx significant for ?CHF, HTN, HLD, CAD s/p PCI 1999 w/ pericath CVA, h/o GI Bleed who is requiring preop clearance for bowel surgery in the setting of intraluminal mass and PSBO.  Per Dr. Windell Hummingbird note from 09/21/11 she had a stress test in 2011 which showed ischemia in the inferior and inferolateral wall. She refused cardiac catheterization and has been treated medically, although she refuses ASA and statins and was taken off Plavix 2/2 GI bleed. There was a note that she was to have an echo in 2011 for symptoms concerning for CHF but I do not see the results of this.  She is not active. She uses a motorized wheelchair in the house and a walker if she leaves the house. She has chest pain with exertion and rest occasionally that is relieved with SL NTG. She also has DOE and LE edema for which she was recently switched from Lasix to Torsemide. She denies orthopnea, syncope, fever, or chills. Presented with abd pain, distension, nausea and found to have cirrhosis, ascites and an intraluminal mass contributing to her PSBO. Cardiology is asked to evaluate her preop risk.   Past Medical History  Diagnosis Date  . Coronary artery disease   . CVA (cerebral vascular accident)   . GERD (gastroesophageal reflux disease)   . Hypertension   . Femoral neck fracture 12/2008    right  . Asthma   . Hyperlipidemia   . Breast cancer       Surgical History:  Past Surgical History  Procedure Date  . Hemiarthroplasty hip   . Cardiac catheterization   . Bilateral mastectomy   . Abdominal hysterectomy      Home Meds: Medication Sig  fluocinonide cream (LIDEX) 0.05 % Apply 1 application  topically 2 (two) times daily. To affected area  montelukast (SINGULAIR) 10 MG tablet Take 10 mg by mouth at bedtime.   nitroGLYCERIN (NITROSTAT) 0.4 MG SL tablet Place 0.4 mg under the tongue every 5 (five) minutes as needed. Chest pain  omeprazole (PRILOSEC) 20 MG capsule Take 20 mg by mouth daily.   potassium chloride (KLOR-CON) 10 MEQ CR tablet Take 10 mEq by mouth 2 (two) times daily.    torsemide (DEMADEX) 20 MG tablet Take 20 mg by mouth 2 (two) times daily as needed. For swelling    Inpatient Medications:    . enoxaparin  40 mg Subcutaneous Q24H  . feeding supplement  1 Container Oral TID BM  . fluocinonide cream  1 application Topical BID  . iohexol  20 mL Oral Q1 Hr x 2  . montelukast  10 mg Oral QHS  . pantoprazole  40 mg Oral Q1200  . sodium chloride  3 mL Intravenous Q12H  . DISCONTD: potassium chloride  20 mEq Oral BID    Allergies:  Allergies  Allergen Reactions  . Codeine     REACTION: Any derevative ----  Becomes unresponsive    History   Social History  . Marital Status: Married    Spouse Name: N/A    Number of Children: N/A  . Years of Education: N/A   Occupational History  . Not on file.   Social History Main Topics  . Smoking status: Never Smoker   .  Smokeless tobacco: Never Used  . Alcohol Use: No  . Drug Use: No  . Sexually Active:    Other Topics Concern  . Not on file   Social History Narrative  . No narrative on file     Family History  Problem Relation Age of Onset  . Heart attack Son     x 2     Review of Systems: General: negative for chills, fever, night sweats or weight changes.  Cardiovascular: (+) chest pain, shortness of breath dyspnea on exertion, edema;  negative for orthopnea, palpitations, or paroxysmal nocturnal dyspnea Dermatological: negative for rash Respiratory: negative for cough or wheezing Urologic: negative for hematuria Abdominal: (+) nausea, abd pain/distension; negative for vomiting, diarrhea, bright red  blood per rectum, melena, or hematemesis Neurologic: negative for visual changes, syncope, or dizziness All other systems reviewed and are otherwise negative except as noted above.  Labs:  Component Value Date   WBC 4.7 10/27/2011   HGB 12.5 10/27/2011   HCT 36.0 10/27/2011   MCV 86.1 10/27/2011   PLT 144* 10/27/2011    Lab 10/27/11 0655  NA 137  K 3.8  CL 105  CO2 24  BUN 7  CREATININE 0.71  CALCIUM 8.6  PROT 6.0  BILITOT 1.3*  ALKPHOS 66  ALT 10  AST 24  GLUCOSE 105*    Radiology/Studies:   10/26/2011 - CT Abd/Pelvis Findings: Curvilinear scarring or atelectasis at the lung bases.  Streak artifact from right hip prosthesis is noted.  Moderate volume of low density ascites.  Dependent hyperdensity within the gallbladder may indicate sludge or previously administered contrast, potentially at an outside institution.  The liver is small in size with a mildly nodular contour.  Spleen is inhomogeneous with suggestion of sub centimeter hypodense lesions, for example image 16, incompletely characterized.  Adrenal glands, pancreas, and kidneys are normal.  Moderate sized hiatal hernia noted.  Small right fluid containing Bochdalek hernia.  No evidence of small bowel malrotation.  Gradual progression to mildly dilated small bowel loop caliber is noted. On images 29 and 30, there is apparent enteroenteric intussusception or possibly intraluminal mass at the proximal jejunum.  Contrast passes distal to this level, although multiple small bowel loops remain dilated within the left upper quadrant, with distal passage of contrast into decompressed ileum.  Colon is decompressed and unremarkable.  Scattered colonic diverticuli noted without evidence for diverticulitis.  Bladder is normal.  Right ovary is normal.  Left ovary and uterus presumed surgically absent.  No acute osseous finding.  IMPRESSION: Proximal partial small bowel obstruction to the level of small bowel (jejunum) intussusception and  possibly intraluminal mass.  Moderate ascites.  Nodular contour and small liver size, which may be seen with cirrhosis in the appropriate context.     EKG: 10/25/11 @ 2147 - SR w/ 1st degree AV block, nonspecific ST/T abnl, grossly unchanged from prior EKG  Physical Exam: Blood pressure 156/78, pulse 98, temperature 98 F (36.7 C), temperature source Oral, resp. rate 20, height 5\' 2"  (1.575 m), weight 135 lb 12.9 oz (61.6 kg), SpO2 96.00%. General: Chronically ill appearing elderly white female, in no acute distress. Head: Normocephalic, atraumatic, sclera non-icteric, no xanthomas, nares are without discharge.  Neck: Negative for carotid bruits. JVD not elevated. Lungs: Scattered exp wheezes; No rales, or rhonchi. Breathing is unlabored. Heart: RRR with S1 S2. 2/6 systolic murmur LUSB; No rubs, or gallops appreciated. Abdomen: Diffusely tender, distended with tinkling bowel sounds.  No rebound/guarding. No obvious abdominal masses.  Msk:  Strength and tone appear normal for age. Extremities: No clubbing or cyanosis. 1+ BLE edema L>R. Chronic venous stasis changes;  Distal pedal pulses are intact and equal bilaterally. Neuro: Alert and oriented X 3. Moves all extremities spontaneously. Psych:  Responds to questions appropriately with a normal affect.   Assessment and Plan:  78 y.o. female w/ PMHx significant for ?CHF, HTN, HLD, CAD s/p PCI 1999 w/ pericath CVA, h/o GI Bleed who is requiring preop clearance for bowel surgery in the setting of intraluminal mass and PSBO.  She has h/o CAD s/p PCI in 1999 and abnormal stress test in 2011, but refused cardiac cath due to having a CVA after her first cath. She had a significant GI bleed in 2012 requiring discontinuation of Plavix. She refuses ASA and statins. She is not active, but has occasional chest pain at rest and with exertion, as well as DOE and LE edema. Unsure of LV systolic function. She now presents with PSBO requiring surgical intervention.  She is at high risk from a cardiac standpoint with her known untreated CAD and ongoing angina. Will place her on Imdur 30mg  daily and monitor BP.   Signed, HOPE, JESSICA PA-C 10/27/2011, 4:40 PM    History reviewed with the patient, no changes to be made.  The patient is frail with a very limited functional level.  She has had a stress perfusion study suggesting ischemia but has refused cath.  She has chest pain with minimal exertion.  She is unable to take ASA or Plavix.  She is being considered for surgery to treat small bowel obstruction with a possible lesion in the jejunum.   The patient exam reveals frail elderly female.  Decreased breath sounds  COR RRR distant heart sounds.  Distended abdomen without bowel sounds but no rebound or guarding.  All available labs, radiology testing, previous records reviewed. Agree with documented assessment and plan. She is clearly high risk from a cardiovascular standpoint for surgery.  I discussed this with her husband, the patient and their pastor.  They would agree to proceed.  I will add nitrates.  She can continue the current beta blocker.  We will follow to see if we can be of assistance.   Fayrene Fearing Leeya Rusconi  5:25 PM 05/28/2011

## 2011-10-27 NOTE — Progress Notes (Signed)
Patient ID: [name], female   DOB: 01/16/1929, 78 y.o.   MRN: [id number]  PATIENT DETAILS Name: [name] Age: 78 y.o. Sex: female Date of Birth: May 20, 1929 Admit Date: 10/25/2011 ION:GEXBMW,UXLKG, MD, MD   Patient admitted with abdominal distention and severe pain.  No vomiting.  Potassium was 2.7. CT Abdomen shows new findings of cirrhosis, ascites, and an intraluminal mass contributing to her PSBO.  Surgery following.  Consults: 1.  [facility] 2.  [facility].  Subjective: Starting to tolerate clears.  Wants to go home.  History of double mastectomy from BRCA.    Objective: Weight change:  No intake or output data in the 24 hours ending 10/27/11 1646 Blood pressure 156/78, pulse 98, temperature 98 F (36.7 C), temperature source Oral, resp. rate 20, height 5\' 2"  (1.575 m), weight 61.6 kg (135 lb 12.9 oz), SpO2 96.00%. Filed Vitals:   10/26/11 1155 10/26/11 1331 10/26/11 2304 10/27/11 0606  BP: 135/78 91/58 120/71 156/78  Pulse: 102 89 97 98  Temp:  97.3 F (36.3 C) 98 F (36.7 C) 98 F (36.7 C)  TempSrc:  Oral Oral Oral  Resp:  20 20 20   Height:      Weight:      SpO2: 95% 95% 93% 96%    Physical Exam: General: No acute distress, but anxious and hard of hearing. Lungs: Clear to auscultation bilaterally without wheezes or crackles Cardiovascular: Regular rate and rhythm without murmur gallop or rub normal S1 and S2 (d/c'd tele) Abdomen: Distended (protuberant).  Soft today, Non tender. Good bowel sounds. Extremities: No significant cyanosis, clubbing, or edema bilateral lower extremities  Basic Metabolic Panel:  Lab 10/27/11 4010 10/26/11 1503  NA 137 139  K 3.8 5.5*  CL 105 106  CO2 24 27  GLUCOSE 105* 134*  BUN 7 9  CREATININE 0.71 0.74  CALCIUM 8.6 8.6  MG -- 1.7  PHOS -- --   CBC:  Lab 10/27/11 0655 10/26/11 0241 10/25/11 2046  WBC 4.7 3.9* --  NEUTROABS -- -- 3.5  HGB 12.5 11.8* --  HCT 36.0 34.4* --  MCV 86.1 85.8 --  PLT 144* 126*  --    Studies/Results: Scheduled Meds:    . enoxaparin  40 mg Subcutaneous Q24H  . feeding supplement  1 Container Oral TID BM  . fluocinonide cream  1 application Topical BID  . iohexol  20 mL Oral Q1 Hr x 2  . montelukast  10 mg Oral QHS  . pantoprazole  40 mg Oral Q1200  . sodium chloride  3 mL Intravenous Q12H  . DISCONTD: potassium chloride  20 mEq Oral BID   Continuous Infusions:    . DISCONTD: dextrose 5 % and 0.9 % NaCl with KCl 40 mEq/L 50 mL/hr at 10/26/11 2205   PRN Meds:.acetaminophen, iohexol, LORazepam, morphine injection, ondansetron (ZOFRAN) IV, ondansetron, zolpidem  Anti-infectives:  Anti-infectives     Start     Dose/Rate Route Frequency Ordered Stop   10/25/11 2215   metroNIDAZOLE (FLAGYL) IVPB 500 mg        500 mg 100 mL/hr over 60 Minutes Intravenous  Once 10/25/11 2213 10/26/11 0015   10/25/11 2215   ciprofloxacin (CIPRO) IVPB 400 mg        400 mg 200 mL/hr over 60 Minutes Intravenous  Once 10/25/11 2213 10/26/11 0359          Assessment/Plan: Principal Problem:  *SBO (small bowel obstruction) Active Problems:  Abdominal mass  HYPERLIPIDEMIA-MIXED  CAD (coronary  artery disease)  Ascites    Partial SBO.  Intraluminal mass found on CT scan.  Surgery on board.  Planning surgery for Friday if patient agrees and cardiology approves.  Patient very nervous about surgery.  Previous history of cardiac stents with a stroke immediately after stenting.  Order placed for PICC line.  Per Cards - patient is high risk surgical candidate.  If [facility] and patient decide against surgery - paracentesis could be considered for cytology of the fluid.  Unable to tolerate pos with the exception of sips and chips.  Concerned for nutritional status if this goes on long term.  Have ordered resource breeze TID.  Hypokalemia.  - resolved. Contributing factor to SBO.  Will replete, check mag.  Check bmet in am.  CAD.  Quiet and Stable during this  admission.  Diarrhea.  Patient reporting one large stool and multiple small stools (like jelly).  K was 5.5 yesterday.  She doesn't appear to have infection.  Will monitor for now.  Cirrhosis.  New diagnosis on CT.  LFTs with in normal limits.  No h/o alcohol use.  Uncertain of significance to her acute problem.  Ascites.  Could have multiple etiologies:  Abdominal mass vs cirrhosis.  Patient also has low albumin.  No diuresis at this point.  May consider diuresis or diagnostic paracentesis if surgery is not an option.  Anxiety.  Situational.  Ativan started PRN  Disposition.  Uncertain at this point.   DVT Prophylaxis. lovenox   LOS: 2 days  Stephani Police 10/27/2011, 4:46 PM [phone]

## 2011-10-27 NOTE — Consult Note (Signed)
[name] [facility] Apr 28, 1929  [id number].   Requesting MD: Dr. Arthor Captain Chief Complaint/Reason for Consult: small bowel intussusception HPI: This is an 78 yo female who apparently has been having some intermittent abdominal symptoms for the last two weeks.  She states that for some time she has had a change in her bowel movements to more of "baby shit" type bowel movements where they are more soft and mucousy.  She does admit to unintentional weight loss of 8lbs over 2-3 weeks.  She does have some nausea, but no emesis.  She continues to have BMs.  Last Wednesday she began having intermittent sharp abdominal pains that were sometimes crampy in her lower abdomen, but severe in the upper portion of her abdomen.  She presented to the [facility] yesterday secondary to these complaints.  She was admitted.  A CT scan was done which questions intussusception, moderate ascites, and possible intraluminal jejunal mass.  We have been asked to see her for this finding.  Review of Systems: Please see HPI, otherwise all other systems have been reviewed and are negative.  Family History  Problem Relation Age of Onset  . Heart attack Son     x 2    Past Medical History  Diagnosis Date  . Coronary artery disease   . CVA (cerebral vascular accident)   . GERD (gastroesophageal reflux disease)   . Hypertension   . Femoral neck fracture 12/2008    right  . Asthma   . Hyperlipidemia   . Breast cancer     Past Surgical History  Procedure Date  . Hemiarthroplasty hip   . Cardiac catheterization   . Bilateral mastectomy   . Abdominal hysterectomy     Social History:  reports that she has never smoked. She has never used smokeless tobacco. She reports that she does not drink alcohol or use illicit drugs.  Allergies:  Allergies  Allergen Reactions  . Codeine     REACTION: Any derevative ----  Becomes unresponsive    Medications Prior to Admission  Medication Dose Route Frequency Provider Last Rate Last Dose    . 0.9 %  sodium chloride infusion   Intravenous STAT Fayrene Helper, PA-C 100 mL/hr at 10/25/11 2328    . acetaminophen (TYLENOL) tablet 650 mg  650 mg Oral Q6H PRN Stephani Police, PA   650 mg at 10/26/11 1552  . ciprofloxacin (CIPRO) IVPB 400 mg  400 mg Intravenous Once Fayrene Helper, PA-C   400 mg at 10/26/11 0259  . enoxaparin (LOVENOX) injection 40 mg  40 mg Subcutaneous Q24H Houston Siren, MD   40 mg at 10/26/11 4098  . fluocinonide cream (LIDEX) 0.05 % 1 application  1 application Topical BID Houston Siren, MD   1 application at 10/26/11 [id number]  . iohexol (OMNIPAQUE) 300 MG/ML solution 20 mL  20 mL Oral Q1 Hr x 2 Medication Radiologist, MD   20 mL at 10/26/11 1730  . iohexol (OMNIPAQUE) 300 MG/ML solution 80 mL  80 mL Intravenous Once PRN Medication Radiologist, MD   80 mL at 10/26/11 1925  . metroNIDAZOLE (FLAGYL) IVPB 500 mg  500 mg Intravenous Once Fayrene Helper, PA-C   500 mg at 10/25/11 2315  . montelukast (SINGULAIR) tablet 10 mg  10 mg Oral QHS Houston Siren, MD   10 mg at 10/26/11 2205  . morphine 2 MG/ML injection 0.5 mg  0.5 mg Intravenous Q4H PRN Maretta Bees, MD      . ondansetron [facility]) injection 4 mg  4 mg Intravenous Q8H PRN Fayrene Helper, PA-C      . ondansetron [facility]) tablet 4 mg  4 mg Oral Q6H PRN Houston Siren, MD       Or  . ondansetron [facility]) injection 4 mg  4 mg Intravenous Q6H PRN Houston Siren, MD      . pantoprazole (PROTONIX) EC tablet 40 mg  40 mg Oral Q1200 Houston Siren, MD   40 mg at 10/26/11 1222  . potassium chloride 10 mEq in 100 mL IVPB  10 mEq Intravenous Once Fayrene Helper, PA-C   10 mEq at 10/25/11 2155  . potassium chloride 10 mEq in 100 mL IVPB  10 mEq Intravenous Q1 Hr x 3 Houston Siren, MD   10 mEq at 10/26/11 0428  . potassium chloride 10 mEq in 100 mL IVPB  10 mEq Intravenous Q1 Hr x 4 Stephani Police, PA   10 mEq at 10/26/11 1222  . sodium chloride 0.9 % bolus 1,000 mL  1,000 mL Intravenous Once Fayrene Helper, PA-C   1,000 mL at 10/25/11 2155  . sodium chloride 0.9 % injection 3 mL  3 mL  Intravenous Q12H Houston Siren, MD      . zolpidem [facility]) tablet 5 mg  5 mg Oral QHS PRN Stephani Police, PA   5 mg at 10/26/11 2205  . DISCONTD: dextrose 5 % and 0.9 % NaCl with KCl 40 mEq/L infusion   Intravenous Continuous Maretta Bees, MD 50 mL/hr at 10/26/11 2205    . DISCONTD: morphine 2 MG/ML injection 0.5 mg  0.5 mg Intravenous Q4H PRN Shanker Levora Dredge, MD      . DISCONTD: morphine 2 MG/ML injection 1 mg  1 mg Intravenous Q4H PRN Stephani Police, PA      . DISCONTD: oxyCODONE-acetaminophen (PERCOCET) 5-325 MG per tablet 1 tablet  1 tablet Oral Q4H PRN Stephani Police, PA      . DISCONTD: potassium chloride SA (K-DUR,KLOR-CON) CR tablet 20 mEq  20 mEq Oral BID Houston Siren, MD   20 mEq at 10/26/11 1610   Medications Prior to Admission  Medication Sig Dispense Refill  . fluocinonide cream (LIDEX) 0.05 % Apply 1 application topically 2 (two) times daily. To affected area      . montelukast (SINGULAIR) 10 MG tablet Take 10 mg by mouth at bedtime.       . nitroGLYCERIN (NITROSTAT) 0.4 MG SL tablet Place 0.4 mg under the tongue every 5 (five) minutes as needed. Chest pain      . omeprazole (PRILOSEC) 20 MG capsule Take 20 mg by mouth daily.       . potassium chloride (KLOR-CON) 10 MEQ CR tablet Take 10 mEq by mouth 2 (two) times daily.        Marland Kitchen torsemide (DEMADEX) 20 MG tablet Take 20 mg by mouth 2 (two) times daily as needed. For swelling        Blood pressure 156/78, pulse 98, temperature 98 F (36.7 C), temperature source Oral, resp. rate 20, height 5\' 2"  (1.575 m), weight 135 lb 12.9 oz (61.6 kg), SpO2 96.00%. Physical Exam: General: WD, WN white female who is laying in bed in NAD HEENT: head is normocephalic, atraumatic.  Sclera are noninjected.  PERRL.  Ears and nose without any masses or lesions.  Mouth is pink and moist Heart: regular, rate, and rhythm.  Normal s1,s2. No obvious murmurs, gallops, or rubs noted.  Palpable radial and pedal pulses bilaterally Lungs: CTAB, no wheezes,  rhonchi, or rales noted.  Respiratory effort nonlabored Abd: soft, tender mildly in the upper portion of her abdomen.  She does have +BS, she is distended.  Despite ascites on CT, she does not have a fluid wave present.  She does not have any masses, hernias, or organomegaly. MS: all 4 extremities are symmetrical with no cyanosis, clubbing, or edema. Skin: warm and dry with no masses, lesions, or rashes Psych: A&Ox3, somewhat anxious.    Results for orders placed during the hospital encounter of 10/25/11 (from the past 48 hour(s))  CBC     Status: Normal   Collection Time   10/25/11  8:46 PM      Component Value Range Comment   WBC 6.1  4.0 - 10.5 (K/uL)    RBC 4.47  3.87 - 5.11 (MIL/uL)    Hemoglobin 13.0  12.0 - 15.0 (g/dL)    HCT 16.1  09.6 - 04.5 (%)    MCV 84.6  78.0 - 100.0 (fL)    MCH 29.1  26.0 - 34.0 (pg)    MCHC 34.4  30.0 - 36.0 (g/dL)    RDW 40.9  81.1 - 91.4 (%)    Platelets 156  150 - 400 (K/uL)   DIFFERENTIAL     Status: Normal   Collection Time   10/25/11  8:46 PM      Component Value Range Comment   Neutrophils Relative 58  43 - 77 (%)    Neutro Abs 3.5  1.7 - 7.7 (K/uL)    Lymphocytes Relative 26  12 - 46 (%)    Lymphs Abs 1.6  0.7 - 4.0 (K/uL)    Monocytes Relative 9  3 - 12 (%)    Monocytes Absolute 0.6  0.1 - 1.0 (K/uL)    Eosinophils Relative 5  0 - 5 (%)    Eosinophils Absolute 0.3  0.0 - 0.7 (K/uL)    Basophils Relative 1  0 - 1 (%)    Basophils Absolute 0.1  0.0 - 0.1 (K/uL)   POCT I-STAT, CHEM 8     Status: Abnormal   Collection Time   10/25/11  8:57 PM      Component Value Range Comment   Sodium 140  135 - 145 (mEq/L)    Potassium 2.7 (*) 3.5 - 5.1 (mEq/L)    Chloride 99  96 - 112 (mEq/L)    BUN 12  6 - 23 (mg/dL)    Creatinine, Ser 7.82  0.50 - 1.10 (mg/dL)    Glucose, Bld 956 (*) 70 - 99 (mg/dL)    Calcium, Ion 2.13 (*) 1.12 - 1.32 (mmol/L)    TCO2 31  0 - 100 (mmol/L)    Hemoglobin 13.3  12.0 - 15.0 (g/dL)    HCT 08.6  57.8 - 46.9 (%)   CBC      Status: Abnormal   Collection Time   10/26/11  2:41 AM      Component Value Range Comment   WBC 3.9 (*) 4.0 - 10.5 (K/uL)    RBC 4.01  3.87 - 5.11 (MIL/uL)    Hemoglobin 11.8 (*) 12.0 - 15.0 (g/dL)    HCT 62.9 (*) 52.8 - 46.0 (%)    MCV 85.8  78.0 - 100.0 (fL)    MCH 29.4  26.0 - 34.0 (pg)    MCHC 34.3  30.0 - 36.0 (g/dL)    RDW 41.3  24.4 - 01.0 (%)    Platelets 126 (*) 150 - 400 (K/uL)  BASIC METABOLIC PANEL     Status: Abnormal   Collection Time   10/26/11  2:41 AM      Component Value Range Comment   Sodium 139  135 - 145 (mEq/L)    Potassium 2.8 (*) 3.5 - 5.1 (mEq/L)    Chloride 101  96 - 112 (mEq/L)    CO2 29  19 - 32 (mEq/L)    Glucose, Bld 94  70 - 99 (mg/dL)    BUN 11  6 - 23 (mg/dL)    Creatinine, Ser 8.65  0.50 - 1.10 (mg/dL)    Calcium 8.2 (*) 8.4 - 10.5 (mg/dL)    GFR calc non Af Amer 77 (*) >90 (mL/min)    GFR calc Af Amer 89 (*) >90 (mL/min)   BASIC METABOLIC PANEL     Status: Abnormal   Collection Time   10/26/11  3:03 PM      Component Value Range Comment   Sodium 139  135 - 145 (mEq/L)    Potassium 5.5 (*) 3.5 - 5.1 (mEq/L)    Chloride 106  96 - 112 (mEq/L)    CO2 27  19 - 32 (mEq/L)    Glucose, Bld 134 (*) 70 - 99 (mg/dL)    BUN 9  6 - 23 (mg/dL)    Creatinine, Ser 7.84  0.50 - 1.10 (mg/dL)    Calcium 8.6  8.4 - 10.5 (mg/dL)    GFR calc non Af Amer 78 (*) >90 (mL/min)    GFR calc Af Amer >90  >90 (mL/min)   MAGNESIUM     Status: Normal   Collection Time   10/26/11  3:03 PM      Component Value Range Comment   Magnesium 1.7  1.5 - 2.5 (mg/dL)   CBC     Status: Abnormal   Collection Time   10/27/11  6:55 AM      Component Value Range Comment   WBC 4.7  4.0 - 10.5 (K/uL)    RBC 4.18  3.87 - 5.11 (MIL/uL)    Hemoglobin 12.5  12.0 - 15.0 (g/dL)    HCT 69.6  29.5 - 28.4 (%)    MCV 86.1  78.0 - 100.0 (fL)    MCH 29.9  26.0 - 34.0 (pg)    MCHC 34.7  30.0 - 36.0 (g/dL)    RDW 13.2  44.0 - 10.2 (%)    Platelets 144 (*) 150 - 400 (K/uL)    Ct  Abdomen Pelvis W Contrast  10/26/2011  *RADIOLOGY REPORT*  Clinical Data: Abdominal distention and pain  CT ABDOMEN AND PELVIS WITH CONTRAST  Technique:  Multidetector CT imaging of the abdomen and pelvis was performed following the standard protocol during bolus administration of intravenous contrast.  Contrast: 80mL OMNIPAQUE IOHEXOL 300 MG/ML  SOLN  Comparison: Radiograph 10/25/2011  Findings: Curvilinear scarring or atelectasis at the lung bases.  Streak artifact from right hip prosthesis is noted.  Moderate volume of low density ascites.  Dependent hyperdensity within the gallbladder may indicate sludge or previously administered contrast, potentially at an outside institution.  The liver is small in size with a mildly nodular contour.  Spleen is inhomogeneous with suggestion of sub centimeter hypodense lesions, for example image 16, incompletely characterized.  Adrenal glands, pancreas, and kidneys are normal.  Moderate sized hiatal hernia noted.  Small right fluid containing Bochdalek hernia.  No evidence of small bowel malrotation.  Gradual progression to mildly dilated small bowel loop caliber is noted. On images 29  and 30, there is apparent enteroenteric intussusception or possibly intraluminal mass at the proximal jejunum.  Contrast passes distal to this level, although multiple small bowel loops remain dilated within the left upper quadrant, with distal passage of contrast into decompressed ileum.  Colon is decompressed and unremarkable.  Scattered colonic diverticuli noted without evidence for diverticulitis.  Bladder is normal.  Right ovary is normal.  Left ovary and uterus presumed surgically absent.  No acute osseous finding.  IMPRESSION: Proximal partial small bowel obstruction to the level of small bowel (jejunum) intussusception and possibly intraluminal mass.  Moderate ascites.  Nodular contour and small liver size, which may be seen with cirrhosis in the appropriate context.  Findings called to  the nurse practitioner Maren Reamer by Dr. Chilton Si on 10/26/2011 at 8:55 p.m.  Original Report Authenticated By: Harrel Lemon, M.D.   Dg Abd 2 Views  10/25/2011  *RADIOLOGY REPORT*  Clinical Data: Abdominal pain and bloating.  ABDOMEN - 2 VIEW  Comparison: None.  Findings: There are mildly dilated loops of small bowel in the left abdomen with a relative paucity of gas elsewhere.  Some air fluid levels are seen in association. Right hip arthroplasty.  IMPRESSION: Dilated small bowel loops in the left abdomen with a relative paucity of gas elsewhere.  Findings are nonspecific.  Difficult to exclude partial or early small bowel obstruction.  Original Report Authenticated By: Reyes Ivan, M.D.   Acute Abdominal Series  10/26/2011  *RADIOLOGY REPORT*  Clinical Data: Follow up bowel obstruction  ACUTE ABDOMEN SERIES (ABDOMEN 2 VIEW & CHEST 1 VIEW)  Comparison: 10/25/2011  Findings: The heart size and mediastinal contours are within normal limits. Coarsened interstitial markings are identified compatible with COPD.  Both lungs are clear.  The visualized skeletal structures are unremarkable.  Since the previous exam there is been interval improvement in dilated small bowel loops.  Right lower quadrant small bowel measures up to 2.3 cm.  IMPRESSION:  1.  Interval improvement in small bowel obstruction pattern. 2.  COPD.  Original Report Authenticated By: Rosealee Albee, M.D.       Assessment/Plan 1. Mild PSBO, ? Jejunal lesion 2. Moderate abdominal ascites  Plan: 1. We will need to review the CT scan with the radiologist to get a better idea of what we may be dealing with.  She does have some ascites and I'm not sure of the cause as she has no risk factors for cirrhosis.  The ascites could be coming from what is going on in her small bowel.  She is not currently obstructed.  If she can tolerate some clear liquids, that is fine.  We will make further recommendations after review of CT with radiology.   I will also check some LFTs.  Harly Pipkins E 10/27/2011, 8:12 AM

## 2011-10-27 NOTE — Progress Notes (Deleted)
Patient ID: [name], female   DOB: 1929-06-21, 78 y.o.   MRN: [id number]

## 2011-10-28 ENCOUNTER — Inpatient Hospital Stay (HOSPITAL_COMMUNITY): Payer: Medicare Other

## 2011-10-28 DIAGNOSIS — R109 Unspecified abdominal pain: Secondary | ICD-10-CM

## 2011-10-28 LAB — CBC
MCHC: 33.9 g/dL (ref 30.0–36.0)
Platelets: 133 10*3/uL — ABNORMAL LOW (ref 150–400)
RDW: 15 % (ref 11.5–15.5)
WBC: 4.3 10*3/uL (ref 4.0–10.5)

## 2011-10-28 MED ORDER — SODIUM CHLORIDE 0.9 % IJ SOLN
10.0000 mL | INTRAMUSCULAR | Status: DC | PRN
  Administered 2011-10-29: 20 mL
  Administered 2011-11-01 – 2011-11-02 (×3): 10 mL

## 2011-10-28 MED ORDER — ALBUTEROL SULFATE (5 MG/ML) 0.5% IN NEBU
2.5000 mg | INHALATION_SOLUTION | RESPIRATORY_TRACT | Status: DC | PRN
Start: 2011-10-28 — End: 2011-11-03
  Administered 2011-10-31 (×2): 2.5 mg via RESPIRATORY_TRACT
  Filled 2011-10-28: qty 0.5
  Filled 2011-10-28: qty 1
  Filled 2011-10-28: qty 0.5

## 2011-10-28 MED ORDER — MONTELUKAST SODIUM 10 MG PO TABS
10.0000 mg | ORAL_TABLET | Freq: Once | ORAL | Status: AC
  Administered 2011-10-28: 10 mg via ORAL
  Filled 2011-10-28: qty 1

## 2011-10-28 MED ORDER — MONTELUKAST SODIUM 10 MG PO TABS
10.0000 mg | ORAL_TABLET | Freq: Every day | ORAL | Status: DC
  Administered 2011-10-30 – 2011-11-03 (×5): 10 mg via ORAL
  Filled 2011-10-28 (×7): qty 1

## 2011-10-28 MED ORDER — ENOXAPARIN SODIUM 40 MG/0.4ML ~~LOC~~ SOLN
40.0000 mg | SUBCUTANEOUS | Status: AC
  Administered 2011-10-28: 40 mg via SUBCUTANEOUS
  Filled 2011-10-28: qty 0.4

## 2011-10-28 MED ORDER — DEXTROSE 5 % IV SOLN
1.0000 g | Freq: Once | INTRAVENOUS | Status: DC
  Filled 2011-10-28: qty 1

## 2011-10-28 NOTE — Progress Notes (Signed)
Addendum  Patient seen and examined, chart and data base reviewed.  I agree with the above assessment and plan  For full details please see Mrs. Cordelia Pen NP note.  Son and husband at bedside, questions answered.  Clint Lipps Pager: [phone] 10/28/2011, 4:33 PM

## 2011-10-28 NOTE — Progress Notes (Signed)
Patient ID: [name], female   DOB: [birth year], 78 y.o.   MRN: [id number]    Subjective: Pt still with intermittent pains.  Scared about surgery and the risks that come with it, but willing to proceed.  Objective: Vital signs in last 24 hours: Temp:  [98.2 F (36.8 C)] 98.2 F (36.8 C) (04/18 0454) Pulse Rate:  [94-110] 110  (04/18 0454) Resp:  [20] 20  (04/18 0454) BP: (124-152)/(67-81) 152/81 mmHg (04/18 0454) SpO2:  [94 %] 94 % (04/17 2259) FiO2 (%):  [2 %] 2 % (04/17 2259) Last BM Date: 10/27/11  Intake/Output from previous day: 04/17 0701 - 04/18 0700 In: 3 [I.V.:3] Out: -  Intake/Output this shift:    PE: Abd: soft, some distention, +BS, mild upper abdominal tenderness  Lab Results:   Basename 10/28/11 0542 10/27/11 0655  WBC 4.3 4.7  HGB 10.8* 12.5  HCT 31.9* 36.0  PLT 133* 144*   BMET  Basename 10/27/11 0655 10/26/11 1503  NA 137 139  K 3.8 5.5*  CL 105 106  CO2 24 27  GLUCOSE 105* 134*  BUN 7 9  CREATININE 0.71 0.74  CALCIUM 8.6 8.6   PT/INR No results found for this basename: LABPROT:2,INR:2 in the last 72 hours CMP     Component Value Date/Time   NA 137 10/27/2011 0655   K 3.8 10/27/2011 0655   CL 105 10/27/2011 0655   CO2 24 10/27/2011 0655   GLUCOSE 105* 10/27/2011 0655   BUN 7 10/27/2011 0655   CREATININE 0.71 10/27/2011 0655   CALCIUM 8.6 10/27/2011 0655   PROT 6.0 10/27/2011 0655   ALBUMIN 2.4* 10/27/2011 0655   AST 24 10/27/2011 0655   ALT 10 10/27/2011 0655   ALKPHOS 66 10/27/2011 0655   BILITOT 1.3* 10/27/2011 0655   GFRNONAA 79* 10/27/2011 0655   GFRAA >90 10/27/2011 0655   Lipase  No results found for this basename: lipase       Studies/Results: Ct Abdomen Pelvis W Contrast  10/26/2011  *RADIOLOGY REPORT*  Clinical Data: Abdominal distention and pain  CT ABDOMEN AND PELVIS WITH CONTRAST  Technique:  Multidetector CT imaging of the abdomen and pelvis was performed following the standard protocol during bolus administration of  intravenous contrast.  Contrast: 80mL OMNIPAQUE IOHEXOL 300 MG/ML  SOLN  Comparison: Radiograph 10/25/2011  Findings: Curvilinear scarring or atelectasis at the lung bases.  Streak artifact from right hip prosthesis is noted.  Moderate volume of low density ascites.  Dependent hyperdensity within the gallbladder may indicate sludge or previously administered contrast, potentially at an outside institution.  The liver is small in size with a mildly nodular contour.  Spleen is inhomogeneous with suggestion of sub centimeter hypodense lesions, for example image 16, incompletely characterized.  Adrenal glands, pancreas, and kidneys are normal.  Moderate sized hiatal hernia noted.  Small right fluid containing Bochdalek hernia.  No evidence of small bowel malrotation.  Gradual progression to mildly dilated small bowel loop caliber is noted. On images 29 and 30, there is apparent enteroenteric intussusception or possibly intraluminal mass at the proximal jejunum.  Contrast passes distal to this level, although multiple small bowel loops remain dilated within the left upper quadrant, with distal passage of contrast into decompressed ileum.  Colon is decompressed and unremarkable.  Scattered colonic diverticuli noted without evidence for diverticulitis.  Bladder is normal.  Right ovary is normal.  Left ovary and uterus presumed surgically absent.  No acute osseous finding.  IMPRESSION: Proximal partial small bowel  obstruction to the level of small bowel (jejunum) intussusception and possibly intraluminal mass.  Moderate ascites.  Nodular contour and small liver size, which may be seen with cirrhosis in the appropriate context.  Findings called to the nurse practitioner Maren Reamer by Dr. Chilton Si on 10/26/2011 at 8:55 p.m.  Original Report Authenticated By: Harrel Lemon, M.D.   Dg Abd 2 Views  10/27/2011  *RADIOLOGY REPORT*  Clinical Data: Abdominal distention, small bowel obstruction.  ABDOMEN - 2 VIEW  Comparison:  10/25/2011  Findings: Oral contrast material noted within decompressed colon. Mild prominence of the left abdominal small bowel loop again noted, likely not significantly changed.  No free air.  No organomegaly.  IMPRESSION: Continued prominence of left abdominal small bowel loop, not significantly changed.  Original Report Authenticated By: Cyndie Chime, M.D.    Anti-infectives: Anti-infectives     Start     Dose/Rate Route Frequency Ordered Stop   10/25/11 2215   metroNIDAZOLE (FLAGYL) IVPB 500 mg        500 mg 100 mL/hr over 60 Minutes Intravenous  Once 10/25/11 2213 10/26/11 0015   10/25/11 2215   ciprofloxacin (CIPRO) IVPB 400 mg        400 mg 200 mL/hr over 60 Minutes Intravenous  Once 10/25/11 2213 10/26/11 0359           Assessment/Plan  1. Small bowel mass, PSBO 2. CAD  Plan: 1. Appreciate medicine and cardiology input.  She is high cardiac risk for surgery given her untreated CAD.  The patient and her husband are aware of this as this was explained by cardiology and reiterated by [place].  The patient is tearful today due to this understanding because she is scared.  She is agreeable to proceed with surgery as her and her husband feel there are really no other options given this lesion in her small bowel.  She may have clears today, but NPO after MN.  Plan for OR tomorrow.   LOS: 3 days    Cobi Delph E 10/28/2011

## 2011-10-28 NOTE — Progress Notes (Signed)
A small amount of bright red blood was found in stool. NP on call notified.

## 2011-10-28 NOTE — Progress Notes (Signed)
PATIENT DETAILS Name: [name] Age: 78 y.o. Sex: female Date of Birth: [birth year] Admit Date: 10/25/2011 UJW:JXBJYN,WGNFA, MD, MD POA:   CONSULTS: General surgery [place] [facility]  PROCEDURES: Plan for OR tomorrow 4/19  Interim history:  No events overnight.  Subjective: Tearful about surgery, but still wishing to proceed. Reports some nausea and intermittent abdominal pain. Requesting po's  Objective: Vital signs in last 24 hours: Temp:  [97.9 F (36.6 C)-98.2 F (36.8 C)] 97.9 F (36.6 C) (04/18 1338) Pulse Rate:  [94-110] 99  (04/18 1338) Resp:  [19-20] 19  (04/18 1338) BP: (124-152)/(67-81) 136/72 mmHg (04/18 1338) SpO2:  [94 %-95 %] 95 % (04/18 1338) FiO2 (%):  [2 %] 2 % (04/17 2259) Weight change:  Last BM Date: 10/27/11  Intake/Output from previous day:  Intake/Output Summary (Last 24 hours) at 10/28/11 1534 Last data filed at 10/28/11 1342  Gross per 24 hour  Intake    723 ml  Output      0 ml  Net    723 ml     Physical Exam:  Gen:  Awake, alert in NAD Cardiovascular:  S1S2 RRR, with SEM, no LE edema Respiratory: bilateral exp wheeze, no increased wob Gastrointestinal: abdomen soft, distended, BS+ Extremities: no c/c/e   Lab Results:  Lab 10/28/11 0542 10/27/11 0655 10/26/11 0241  HGB 10.8* 12.5 11.8*  HCT 31.9* 36.0 34.4*  WBC 4.3 4.7 3.9*  PLT 133* 144* 126*     Lab 10/27/11 0655 10/26/11 1503 10/26/11 0241 10/25/11 2057  NA 137 139 139 140  K 3.8 5.5* -- --  CL 105 106 101 99  CO2 24 27 29  --  GLUCOSE 105* 134* 94 129*  BUN 7 9 11 12   CREATININE 0.71 0.74 0.79 0.90  CALCIUM 8.6 8.6 8.2* --  MG -- 1.7 -- --  PHOS -- -- -- --    Studies/Results: Ct Abdomen Pelvis W Contrast  10/26/2011  *RADIOLOGY REPORT*  Clinical Data: Abdominal distention and pain  CT ABDOMEN AND PELVIS WITH CONTRAST  Technique:  Multidetector CT imaging of the abdomen and pelvis was performed following the standard protocol during bolus  administration of intravenous contrast.  Contrast: 80mL OMNIPAQUE IOHEXOL 300 MG/ML  SOLN  Comparison: Radiograph 10/25/2011  Findings: Curvilinear scarring or atelectasis at the lung bases.  Streak artifact from right hip prosthesis is noted.  Moderate volume of low density ascites.  Dependent hyperdensity within the gallbladder may indicate sludge or previously administered contrast, potentially at an outside institution.  The liver is small in size with a mildly nodular contour.  Spleen is inhomogeneous with suggestion of sub centimeter hypodense lesions, for example image 16, incompletely characterized.  Adrenal glands, pancreas, and kidneys are normal.  Moderate sized hiatal hernia noted.  Small right fluid containing Bochdalek hernia.  No evidence of small bowel malrotation.  Gradual progression to mildly dilated small bowel loop caliber is noted. On images 29 and 30, there is apparent enteroenteric intussusception or possibly intraluminal mass at the proximal jejunum.  Contrast passes distal to this level, although multiple small bowel loops remain dilated within the left upper quadrant, with distal passage of contrast into decompressed ileum.  Colon is decompressed and unremarkable.  Scattered colonic diverticuli noted without evidence for diverticulitis.  Bladder is normal.  Right ovary is normal.  Left ovary and uterus presumed surgically absent.  No acute osseous finding.  IMPRESSION: Proximal partial small bowel obstruction to the level of small bowel (jejunum) intussusception and possibly intraluminal mass.  Moderate ascites.  Nodular contour and small liver size, which may be seen with cirrhosis in the appropriate context.  Findings called to the nurse practitioner Maren Reamer by Dr. Chilton Si on 10/26/2011 at 8:55 p.m.  Original Report Authenticated By: Harrel Lemon, M.D.   Dg Chest Port 1 View  10/28/2011  *RADIOLOGY REPORT*  Clinical Data: PICC line placement  PORTABLE CHEST - 1 VIEW   Comparison: September 16, 2010  Findings: The right upper extremity PICC line tip is at the cavoatrial junction.  No pneumothorax.  Cardiomegaly is unchanged. The mediastinum and pulmonary vasculature are within normal limits. Both lungs are clear.  IMPRESSION: PICC line tip is at the cavoatrial junction.  No pneumothorax.  Original Report Authenticated By: Brandon Melnick, M.D.   Dg Abd 2 Views  10/27/2011  *RADIOLOGY REPORT*  Clinical Data: Abdominal distention, small bowel obstruction.  ABDOMEN - 2 VIEW  Comparison: 10/25/2011  Findings: Oral contrast material noted within decompressed colon. Mild prominence of the left abdominal small bowel loop again noted, likely not significantly changed.  No free air.  No organomegaly.  IMPRESSION: Continued prominence of left abdominal small bowel loop, not significantly changed.  Original Report Authenticated By: Cyndie Chime, M.D.    Medications: Scheduled Meds:   . cefOXitin  1 g Intravenous Once  . enoxaparin  40 mg Subcutaneous Q24H  . feeding supplement  1 Container Oral TID BM  . fluocinonide cream  1 application Topical BID  . isosorbide mononitrate  30 mg Oral Daily  . montelukast  10 mg Oral Daily  . pantoprazole  40 mg Oral Q1200  . sodium chloride  3 mL Intravenous Q12H  . DISCONTD: enoxaparin  40 mg Subcutaneous Q24H  . DISCONTD: montelukast  10 mg Oral QHS   Continuous Infusions:  PRN Meds:.acetaminophen, albuterol, LORazepam, morphine injection, ondansetron (ZOFRAN) IV, ondansetron, sodium chloride, zolpidem Antibiotics: Anti-infectives     Start     Dose/Rate Route Frequency Ordered Stop   10/29/11 0600   cefOXitin (MEFOXIN) 1 g in dextrose 5 % 50 mL IVPB     Comments: Please give on call to OR      1 g 100 mL/hr over 30 Minutes Intravenous  Once 10/28/11 0824     10/25/11 2215   metroNIDAZOLE (FLAGYL) IVPB 500 mg        500 mg 100 mL/hr over 60 Minutes Intravenous  Once 10/25/11 2213 10/26/11 0015   10/25/11 2215    ciprofloxacin (CIPRO) IVPB 400 mg        400 mg 200 mL/hr over 60 Minutes Intravenous  Once 10/25/11 2213 10/26/11 0359           Assessment/Plan:  Partial SBO. Intraluminal mass found on CT scan. Surgery on board. Planning surgery for Friday. Previous history of cardiac stents with a stroke immediately after stenting making pt a high risk surgical candidate. Cardiology did consult for preop clearance and agree she is high risk. Nitrates added. Pt and husband are aware of high risk and wish to proceed with surgery. Order placed for PICC line.   Hypokalemia. - resolved.   CAD. Quiet and Stable during this admission.   Cirrhosis. New diagnosis on CT. LFTs with in normal limits. No h/o alcohol use. Uncertain of significance to her acute problem. Will follow-up with treatment of above issue.   Anxiety. Situational. Ativan started PRN   Hx asthma She does have mild wheeze on exam today. Will add prn nebs. Monitor. CXR done today  without evidence of infiltrate.  DVT Prophylaxis. lovenox  Cordelia Pen, NP-C [facility]  pgr [phone]   LOS: 3 days    10/28/2011, 3:34 PM

## 2011-10-28 NOTE — Progress Notes (Signed)
PT Cancellation Note   10/28/11 1008  PT Visit Information  Last PT Attempted On 10/28/11  Reason Eval/Treat Not Completed Patient refused (pt anxious about surgery tomorrow)     [facility] PT [phone]

## 2011-10-28 NOTE — Progress Notes (Signed)
Order received, chart reviewed, pt quite anxious about surgery and so not a good time for therapy today. Please re-order OT/PT with activity clarification post surgery.  Thank you.

## 2011-10-29 ENCOUNTER — Encounter (HOSPITAL_COMMUNITY): Payer: Self-pay | Admitting: Certified Registered"

## 2011-10-29 ENCOUNTER — Inpatient Hospital Stay (HOSPITAL_COMMUNITY): Payer: Medicare Other | Admitting: Certified Registered"

## 2011-10-29 ENCOUNTER — Encounter (HOSPITAL_COMMUNITY)
Admission: EM | Disposition: A | Discharge status: Home / Self Care | Payer: Self-pay | Source: Home / Self Care | Attending: Internal Medicine

## 2011-10-29 DIAGNOSIS — K660 Peritoneal adhesions (postprocedural) (postinfection): Secondary | ICD-10-CM

## 2011-10-29 HISTORY — PX: LAPAROTOMY: SHX154

## 2011-10-29 LAB — CBC
MCH: 29.1 pg (ref 26.0–34.0)
MCV: 85.2 fL (ref 78.0–100.0)
Platelets: 107 10*3/uL — ABNORMAL LOW (ref 150–400)
RBC: 3.37 MIL/uL — ABNORMAL LOW (ref 3.87–5.11)
RDW: 14.9 % (ref 11.5–15.5)
WBC: 3 10*3/uL — ABNORMAL LOW (ref 4.0–10.5)

## 2011-10-29 LAB — BASIC METABOLIC PANEL
CO2: 24 mEq/L (ref 19–32)
Calcium: 8.2 mg/dL — ABNORMAL LOW (ref 8.4–10.5)
Creatinine, Ser: 0.72 mg/dL (ref 0.50–1.10)
GFR calc Af Amer: >90 mL/min (ref >90)
Sodium: 137 mEq/L (ref 135–145)

## 2011-10-29 LAB — SURGICAL PCR SCREEN
MRSA, PCR: NEGATIVE
Staphylococcus aureus: NEGATIVE

## 2011-10-29 LAB — MRSA PCR SCREENING: MRSA by PCR: NEGATIVE

## 2011-10-29 SURGERY — LAPAROTOMY, EXPLORATORY
Anesthesia: General | Site: Abdomen | Wound class: Clean

## 2011-10-29 MED ORDER — PROPOFOL 10 MG/ML IV EMUL
INTRAVENOUS | Status: DC | PRN
  Administered 2011-10-29: 80 mg via INTRAVENOUS

## 2011-10-29 MED ORDER — LABETALOL HCL 5 MG/ML IV SOLN
INTRAVENOUS | Status: AC
  Filled 2011-10-29: qty 4

## 2011-10-29 MED ORDER — HYDROMORPHONE HCL PF 1 MG/ML IJ SOLN
INTRAMUSCULAR | Status: AC
  Filled 2011-10-29: qty 1

## 2011-10-29 MED ORDER — SUCCINYLCHOLINE CHLORIDE 20 MG/ML IJ SOLN
INTRAMUSCULAR | Status: DC | PRN
  Administered 2011-10-29: 80 mg via INTRAVENOUS

## 2011-10-29 MED ORDER — GLYCOPYRROLATE 0.2 MG/ML IJ SOLN
INTRAMUSCULAR | Status: DC | PRN
  Administered 2011-10-29: 0.4 mg via INTRAVENOUS

## 2011-10-29 MED ORDER — SODIUM CHLORIDE 0.9 % IV SOLN
INTRAVENOUS | Status: DC | PRN
  Administered 2011-10-29: 11:00:00 via INTRAVENOUS

## 2011-10-29 MED ORDER — ROCURONIUM BROMIDE 100 MG/10ML IV SOLN
INTRAVENOUS | Status: DC | PRN
  Administered 2011-10-29: 30 mg via INTRAVENOUS

## 2011-10-29 MED ORDER — LACTATED RINGERS IV SOLN
INTRAVENOUS | Status: DC
  Administered 2011-10-29: 09:00:00 via INTRAVENOUS

## 2011-10-29 MED ORDER — LACTATED RINGERS IV SOLN
INTRAVENOUS | Status: DC | PRN
  Administered 2011-10-29: 09:00:00 via INTRAVENOUS

## 2011-10-29 MED ORDER — SUFENTANIL CITRATE 50 MCG/ML IV SOLN
INTRAVENOUS | Status: DC | PRN
  Administered 2011-10-29: 10 ug via INTRAVENOUS

## 2011-10-29 MED ORDER — SODIUM CHLORIDE 0.9 % IV SOLN
1.0000 g | Freq: Once | INTRAVENOUS | Status: DC
  Filled 2011-10-29: qty 1

## 2011-10-29 MED ORDER — NEOSTIGMINE METHYLSULFATE 1 MG/ML IJ SOLN
INTRAMUSCULAR | Status: DC | PRN
  Administered 2011-10-29: 3 mg via INTRAVENOUS

## 2011-10-29 MED ORDER — LIDOCAINE HCL 4 % MT SOLN
OROMUCOSAL | Status: DC | PRN
  Administered 2011-10-29: 4 mL via TOPICAL

## 2011-10-29 MED ORDER — DEXTROSE IN LACTATED RINGERS 5 % IV SOLN
INTRAVENOUS | Status: DC
  Administered 2011-10-29 – 2011-10-31 (×3): via INTRAVENOUS

## 2011-10-29 MED ORDER — LABETALOL HCL 5 MG/ML IV SOLN
5.0000 mg | INTRAVENOUS | Status: DC | PRN
  Administered 2011-10-29 (×2): 5 mg via INTRAVENOUS

## 2011-10-29 MED ORDER — SODIUM CHLORIDE 0.9 % IV SOLN
1.0000 g | INTRAVENOUS | Status: DC | PRN
  Administered 2011-10-29: 1 g via INTRAVENOUS

## 2011-10-29 MED ORDER — POVIDONE-IODINE 10 % EX OINT
TOPICAL_OINTMENT | CUTANEOUS | Status: DC | PRN
  Administered 2011-10-29: 1 via TOPICAL

## 2011-10-29 MED ORDER — 0.9 % SODIUM CHLORIDE (POUR BTL) OPTIME
TOPICAL | Status: DC | PRN
  Administered 2011-10-29 (×2): 1000 mL

## 2011-10-29 MED ORDER — SODIUM CHLORIDE 0.9 % IJ SOLN
10.0000 mL | Freq: Two times a day (BID) | INTRAMUSCULAR | Status: DC
  Administered 2011-10-29 – 2011-11-01 (×5): 10 mL

## 2011-10-29 MED ORDER — HYDROMORPHONE HCL PF 1 MG/ML IJ SOLN
0.2500 mg | INTRAMUSCULAR | Status: DC | PRN
  Administered 2011-10-29 (×5): 0.5 mg via INTRAVENOUS

## 2011-10-29 SURGICAL SUPPLY — 43 items
BLADE SURG ROTATE 9660 (MISCELLANEOUS) IMPLANT
CANISTER SUCTION 2500CC (MISCELLANEOUS) ×5 IMPLANT
CHLORAPREP W/TINT 26ML (MISCELLANEOUS) ×3 IMPLANT
CLOTH BEACON ORANGE TIMEOUT ST (SAFETY) ×3 IMPLANT
CONT SPEC STER OR (MISCELLANEOUS) ×2 IMPLANT
COVER SURGICAL LIGHT HANDLE (MISCELLANEOUS) ×3 IMPLANT
DRAPE LAPAROSCOPIC ABDOMINAL (DRAPES) ×3 IMPLANT
DRAPE UTILITY 15X26 W/TAPE STR (DRAPE) ×6 IMPLANT
DRAPE WARM FLUID 44X44 (DRAPE) ×3 IMPLANT
ELECT REM PT RETURN 9FT ADLT (ELECTROSURGICAL) ×3
ELECTRODE REM PT RTRN 9FT ADLT (ELECTROSURGICAL) ×2 IMPLANT
GLOVE BIO SURGEON STRL SZ7 (GLOVE) ×4 IMPLANT
GLOVE BIO SURGEON STRL SZ7.5 (GLOVE) ×5 IMPLANT
GLOVE BIOGEL PI IND STRL 7.0 (GLOVE) ×3 IMPLANT
GLOVE BIOGEL PI IND STRL 8 (GLOVE) ×1 IMPLANT
GLOVE BIOGEL PI INDICATOR 7.0 (GLOVE) ×3
GLOVE BIOGEL PI INDICATOR 8 (GLOVE) ×1
GLOVE SURG SS PI 6.5 STRL IVOR (GLOVE) ×4 IMPLANT
GLOVE SURG SS PI 7.5 STRL IVOR (GLOVE) ×2 IMPLANT
GOWN STRL NON-REIN LRG LVL3 (GOWN DISPOSABLE) ×8 IMPLANT
KIT BASIN OR (CUSTOM PROCEDURE TRAY) ×3 IMPLANT
KIT ROOM TURNOVER OR (KITS) ×3 IMPLANT
LIGASURE IMPACT 36 18CM CVD LR (INSTRUMENTS) IMPLANT
NS IRRIG 1000ML POUR BTL (IV SOLUTION) ×5 IMPLANT
PACK GENERAL/GYN (CUSTOM PROCEDURE TRAY) ×3 IMPLANT
PAD ARMBOARD 7.5X6 YLW CONV (MISCELLANEOUS) ×4 IMPLANT
SEALER TISSUE X1 CVD JAW (INSTRUMENTS) ×2 IMPLANT
SPECIMEN JAR LARGE (MISCELLANEOUS) IMPLANT
SPONGE GAUZE 4X4 12PLY (GAUZE/BANDAGES/DRESSINGS) ×3 IMPLANT
SPONGE LAP 18X18 X RAY DECT (DISPOSABLE) IMPLANT
STAPLER VISISTAT 35W (STAPLE) ×3 IMPLANT
SUCTION POOLE TIP (SUCTIONS) ×2 IMPLANT
SUT PDS AB 1 TP1 96 (SUTURE) ×6 IMPLANT
SUT SILK 2 0 SH CR/8 (SUTURE) ×3 IMPLANT
SUT SILK 2 0 TIES 10X30 (SUTURE) ×3 IMPLANT
SUT SILK 3 0 SH CR/8 (SUTURE) ×3 IMPLANT
SUT SILK 3 0 TIES 10X30 (SUTURE) ×3 IMPLANT
SUT VIC AB 3-0 SH 18 (SUTURE) IMPLANT
TOWEL OR 17X24 6PK STRL BLUE (TOWEL DISPOSABLE) ×3 IMPLANT
TOWEL OR 17X26 10 PK STRL BLUE (TOWEL DISPOSABLE) ×3 IMPLANT
TRAY FOLEY CATH 14FRSI W/METER (CATHETERS) ×2 IMPLANT
WATER STERILE IRR 1000ML POUR (IV SOLUTION) ×1 IMPLANT
YANKAUER SUCT BULB TIP NO VENT (SUCTIONS) ×2 IMPLANT

## 2011-10-29 NOTE — Anesthesia Postprocedure Evaluation (Signed)
  Anesthesia Post-op Note  Patient: [name]  Procedure(s) Performed: Procedure(s) (LRB): EXPLORATORY LAPAROTOMY (N/A)  Patient Location: PACU  Anesthesia Type: General  Level of Consciousness: awake  Airway and Oxygen Therapy: Patient Spontanous Breathing and Patient connected to nasal cannula oxygen  Post-op Pain: moderate  Post-op Assessment: Post-op Vital signs reviewed, Patient's Cardiovascular Status Stable, Respiratory Function Stable and Patent Airway  Post-op Vital Signs: Reviewed and stable  Complications: No apparent anesthesia complications

## 2011-10-29 NOTE — Anesthesia Preprocedure Evaluation (Addendum)
Anesthesia Evaluation  Patient identified by MRN, date of birth, ID band Patient awake    Reviewed: Allergy & Precautions, H&P , NPO status , Patient's Chart, lab work & pertinent test results  Airway Mallampati: II TM Distance: >3 FB Neck ROM: Full    Dental No notable dental hx. (+) Edentulous Upper and Edentulous Lower   Pulmonary shortness of breath and with exertion,  breath sounds clear to auscultation  Pulmonary exam normal       Cardiovascular hypertension, On Medications + angina at rest + CAD Rhythm:Regular Rate:Normal     Neuro/Psych CVA, No Residual Symptoms negative psych ROS   GI/Hepatic Neg liver ROS, GERD-  Medicated,  Endo/Other  negative endocrine ROS  Renal/GU negative Renal ROS  negative genitourinary   Musculoskeletal   Abdominal   Peds  Hematology negative hematology ROS (+)   Anesthesia Other Findings   Reproductive/Obstetrics negative OB ROS                           Anesthesia Physical Anesthesia Plan  ASA: IV  Anesthesia Plan: General   Post-op Pain Management:    Induction: Intravenous, Rapid sequence and Cricoid pressure planned  Airway Management Planned: Oral ETT  Additional Equipment: Arterial line  Intra-op Plan:   Post-operative Plan: Extubation in OR and Possible Post-op intubation/ventilation  Informed Consent: I have reviewed the patients History and Physical, chart, labs and discussed the procedure including the risks, benefits and alternatives for the proposed anesthesia with the patient or authorized representative who has indicated his/her understanding and acceptance.     Plan Discussed with: CRNA  Anesthesia Plan Comments:        Anesthesia Quick Evaluation

## 2011-10-29 NOTE — Progress Notes (Signed)
PT Cancellation Note   10/29/11 0932  PT Visit Information  Last PT Attempted On 10/29/11  Reason Eval/Treat Not Completed Other (comment) (Pt going to surgery.)    [facility] PT [phone]

## 2011-10-29 NOTE — Progress Notes (Signed)
Patient ID: [name], female   DOB: August 14, 1928, 78 y.o.   MRN: [id number]   PATIENT DETAILS  Name: [name] Age: 78 y.o. Sex: female Date of Birth: 1929-03-03 Admit Date: 10/25/2011 WUJ:WJXBJY,NWGNF, MD, MD POA:   CONSULTS: General surgery [place] [facility]  PROCEDURES: Plan for OR today  Interim history:  No events overnight.  Subjective: Pleasant, no complaints, just nervous about upcoming surgery.  Objective: Vital signs in last 24 hours: Temp:  [98.2 F (36.8 C)-98.6 F (37 C)] 98.4 F (36.9 C) (04/19 1203) Pulse Rate:  [75-105] 77  (04/19 1400) Resp:  [11-25] 13  (04/19 1400) BP: (122-177)/(62-112) 147/78 mmHg (04/19 1403) SpO2:  [90 %-96 %] 96 % (04/19 1400) Arterial Line BP: (153-194)/(65-80) 166/75 mmHg (04/19 1400) Weight change:  Last BM Date: 10/29/11  Intake/Output from previous day:  Intake/Output Summary (Last 24 hours) at 10/29/11 1429 Last data filed at 10/29/11 1205  Gross per 24 hour  Intake   2260 ml  Output    100 ml  Net   2160 ml     Physical Exam:  Gen:  Awake, alert in NAD, appears frail Cardiovascular:  S1S2 RRR, with SEM, no LE edema Respiratory: CTA, no increased wob Gastrointestinal: abdomen soft, distended, BS+ Extremities: no c/c/e   Lab Results:  Lab 10/29/11 0505 10/28/11 0542 10/27/11 0655  HGB 9.8* 10.8* 12.5  HCT 28.7* 31.9* 36.0  WBC 3.0* 4.3 4.7  PLT 107* 133* 144*     Lab 10/29/11 0505 10/27/11 0655 10/26/11 1503 10/26/11 0241 10/25/11 2057  NA 137 137 139 139 140  K 3.4* 3.8 -- -- --  CL 106 105 106 101 99  CO2 24 24 27 29  --  GLUCOSE 91 105* 134* 94 129*  BUN 6 7 9 11 12   CREATININE 0.72 0.71 0.74 0.79 0.90  CALCIUM 8.2* 8.6 8.6 8.2* --  MG -- -- 1.7 -- --  PHOS -- -- -- -- --    Studies/Results: Dg Chest Port 1 View  10/28/2011  *RADIOLOGY REPORT*  Clinical Data: PICC line placement  PORTABLE CHEST - 1 VIEW  Comparison: September 16, 2010  Findings: The right upper extremity PICC line  tip is at the cavoatrial junction.  No pneumothorax.  Cardiomegaly is unchanged. The mediastinum and pulmonary vasculature are within normal limits. Both lungs are clear.  IMPRESSION: PICC line tip is at the cavoatrial junction.  No pneumothorax.  Original Report Authenticated By: Brandon Melnick, M.D.    Medications: Scheduled Meds:    . cefOXitin  1 g Intravenous Once  . ertapenem  1 g Intravenous Once  . feeding supplement  1 Container Oral TID BM  . fluocinonide cream  1 application Topical BID  . HYDROmorphone      . HYDROmorphone      . isosorbide mononitrate  30 mg Oral Daily  . labetalol      . montelukast  10 mg Oral Daily  . montelukast  10 mg Oral Once  . pantoprazole  40 mg Oral Q1200  . sodium chloride  3 mL Intravenous Q12H   Continuous Infusions:    . lactated ringers 20 mL/hr at 10/29/11 0925   PRN Meds:.acetaminophen, albuterol, HYDROmorphone, labetalol, LORazepam, morphine injection, ondansetron (ZOFRAN) IV, ondansetron, sodium chloride, zolpidem, DISCONTD: 0.9 % irrigation (POUR BTL), DISCONTD: povidone-iodine Antibiotics: Anti-infectives     Start     Dose/Rate Route Frequency Ordered Stop   10/29/11 1100   ertapenem (INVANZ) 1 g in sodium chloride  0.9 % 50 mL IVPB        1 g 100 mL/hr over 30 Minutes Intravenous  Once 10/29/11 1047     10/29/11 0600   cefOXitin (MEFOXIN) 1 g in dextrose 5 % 50 mL IVPB     Comments: Please give on call to OR      1 g 100 mL/hr over 30 Minutes Intravenous  Once 10/28/11 0824     10/25/11 2215   metroNIDAZOLE (FLAGYL) IVPB 500 mg        500 mg 100 mL/hr over 60 Minutes Intravenous  Once 10/25/11 2213 10/26/11 0015   10/25/11 2215   ciprofloxacin (CIPRO) IVPB 400 mg        400 mg 200 mL/hr over 60 Minutes Intravenous  Once 10/25/11 2213 10/26/11 0359           Assessment/Plan:  Partial SBO. Intraluminal mass found on CT scan. Surgery on board. Going to OR today for exploration and biopsy.   Previous history of  cardiac stents with a stroke immediately after stenting making pt a high risk surgical candidate. Cardiology did consult for preop clearance and agree she is high risk. Nitrates added. Pt and husband are aware of high risk and wish to proceed with surgery.   Hypokalemia. Stable.  Will be repleted after surgery  CAD. Quiet and Stable during this admission.   Cirrhosis. New diagnosis on CT. LFTs with in normal limits. No h/o alcohol use. Uncertain of significance to her acute problem. Will follow-up with treatment of above issue.   Anxiety. Situational. Ativan started PRN   Hx asthma Will add prn nebs. Monitor. CXR done today without evidence of infiltrate.  Pancytopenia. Will monitor after surgery.  Could be associated with acute problem.  Hgb dropping.  Will guiac stool and monitor closely.   DVT Prophylaxis. lovenox  Algis Downs, [place] [facility] Pager: [phone]   LOS: 4 days    10/29/2011, 2:29 PM

## 2011-10-29 NOTE — Progress Notes (Signed)
Day of Surgery  Subjective: No complaints. Pt is nervous about surgery  Objective: Vital signs in last 24 hours: Temp:  [97.9 F (36.6 C)-98.6 F (37 C)] 98.2 F (36.8 C) (04/19 0525) Pulse Rate:  [99-104] 100  (04/19 0525) Resp:  [19-20] 20  (04/19 0525) BP: (124-136)/(62-72) 124/63 mmHg (04/19 0525) SpO2:  [92 %-95 %] 92 % (04/19 0525) Last BM Date: 10/29/11  Intake/Output from previous day: 04/18 0701 - 04/19 0700 In: 1080 [P.O.:1080] Out: -  Intake/Output this shift:    GI: soft, distended  Lab Results:   Basename 10/29/11 0505 10/28/11 0542  WBC 3.0* 4.3  HGB 9.8* 10.8*  HCT 28.7* 31.9*  PLT 107* 133*   BMET  Basename 10/29/11 0505 10/27/11 0655  NA 137 137  K 3.4* 3.8  CL 106 105  CO2 24 24  GLUCOSE 91 105*  BUN 6 7  CREATININE 0.72 0.71  CALCIUM 8.2* 8.6   PT/INR No results found for this basename: LABPROT:2,INR:2 in the last 72 hours ABG No results found for this basename: PHART:2,PCO2:2,PO2:2,HCO3:2 in the last 72 hours  Studies/Results: Dg Chest Port 1 View  10/28/2011  *RADIOLOGY REPORT*  Clinical Data: PICC line placement  PORTABLE CHEST - 1 VIEW  Comparison: September 16, 2010  Findings: The right upper extremity PICC line tip is at the cavoatrial junction.  No pneumothorax.  Cardiomegaly is unchanged. The mediastinum and pulmonary vasculature are within normal limits. Both lungs are clear.  IMPRESSION: PICC line tip is at the cavoatrial junction.  No pneumothorax.  Original Report Authenticated By: Brandon Melnick, M.D.    Anti-infectives: Anti-infectives     Start     Dose/Rate Route Frequency Ordered Stop   10/29/11 0600   cefOXitin (MEFOXIN) 1 g in dextrose 5 % 50 mL IVPB     Comments: Please give on call to OR      1 g 100 mL/hr over 30 Minutes Intravenous  Once 10/28/11 0824     10/25/11 2215   metroNIDAZOLE (FLAGYL) IVPB 500 mg        500 mg 100 mL/hr over 60 Minutes Intravenous  Once 10/25/11 2213 10/26/11 0015   10/25/11 2215    ciprofloxacin (CIPRO) IVPB 400 mg        400 mg 200 mL/hr over 60 Minutes Intravenous  Once 10/25/11 2213 10/26/11 0359          Assessment/Plan: s/p Procedure(s) (LRB): EXPLORATORY LAPAROTOMY (N/A) SMALL BOWEL RESECTION (N/A) Plan for surgery today  LOS: 4 days    TOTH III,Breck Hollinger S 10/29/2011

## 2011-10-29 NOTE — Op Note (Addendum)
10/25/2011 - 10/29/2011  11:39 AM  PATIENT:  [name]  78 y.o. female  PRE-OPERATIVE DIAGNOSIS:  SMALL BOWEL MASS  POST-OPERATIVE DIAGNOSIS:    NO SMALL BOWEL MASS NO SBO CIRRHOSIS with ascites  PROCEDURE:  Procedure(s) (LRB): EXPLORATORY LAPAROTOMY (N/A)  SURGEON:  Surgeon(s) and Role:    * Robyne Askew, MD - Primary  PHYSICIAN ASSISTANT:   ASSISTANTS: none   ANESTHESIA:   general  EBL:  Total I/O In: 700 [Blood:700] Out: -   BLOOD ADMINISTERED:none  DRAINS: none   LOCAL MEDICATIONS USED:  NONE  SPECIMEN:  Source of Specimen:  peritoneal washings  DISPOSITION OF SPECIMEN:  PATHOLOGY  COUNTS:  YES  TOURNIQUET:  * No tourniquets in log *  DICTATION: .Dragon Dictation After informed consent was obtained the patient was brought to the operating room and placed in the supine position on the operating room table. After adequate induction of general anesthesia the patient's abdomen was prepped with ChloraPrep, allowed to dry, and draped in usual sterile manner. An upper midline incision was made with a 10 blade knife. This incision was carried down through the skin and subcutaneous tissue sharply with the electrocautery until the linea alba was identified. The linea alba was incised with the electrocautery. The peritoneum was identified. The peritoneum was grasped with hemostats and divided with Metzenbaum scissors. This allowed [place] to gain access to the abdominal cavity. The rest of the incision was opened under direct vision with the electrocautery. There was a significant amount of clear yellow ascites fluid in the abdomen. Some of this was aspirated and sent to pathology for cytology. The rest of the fluid was evacuated. There were some omental adhesions to the anterior abdominal wall which were taken down sharply with the Enseal super jaw. The liver had a very cirrhotic appearance. The gallbladder looked normal. The small bowel was run from the ligament of Treitz  to the ileocecal bowel and the small bowel was completely normal. The colon was evaluated along its complete course and appeared normal. There was one scar found on the surface of the rectosigmoid area which I believe was some scar from the end of the tube. This was seen on the surface of the rectosigmoid and was not amenable to biopsy. No other implants were identified. All the peritoneal surfaces were smooth. The spleen had some small area where it is on its surface that felt like grains of sand. The spleen otherwise looked normal. At this point we did not find any source of partial obstruction to the intestines. At this point we decided to close the abdomen with 2 running #1 double-stranded PDS sutures. The subcutaneous tissue was irrigated with copious amounts of saline. The skin was closed with staples and sterile dressings were applied. The patient tolerated the procedure well. At the in the case all needle sponge and instrument counts were correct. The patient was then awakened and taken to recovery in stable condition.  PLAN OF CARE: Admit to inpatient   PATIENT DISPOSITION:  PACU - hemodynamically stable.   Delay start of Pharmacological VTE agent (>24hrs) due to surgical blood loss or risk of bleeding: yes  Ollen Melesio [place]. Carolynne Edouard, MD,FACS [facility]

## 2011-10-29 NOTE — Progress Notes (Signed)
Addendum  Patient seen and examined, chart and data base reviewed.  I agree with the above assessment and plan  For full details please see Mrs. Algis Downs PA. Note.  Clint Lipps Pager: [phone] 10/29/2011, 4:12 PM

## 2011-10-29 NOTE — Transfer of Care (Signed)
Immediate Anesthesia Transfer of Care Note  Patient: [name]  Procedure(s) Performed: Procedure(s) (LRB): EXPLORATORY LAPAROTOMY (N/A)  Patient Location: PACU  Anesthesia Type: General  Level of Consciousness: awake, oriented, patient cooperative and responds to stimulation  Airway & Oxygen Therapy: Patient Spontanous Breathing and Patient connected to nasal cannula oxygen  Post-op Assessment: Report given to PACU RN, Post -op Vital signs reviewed and stable and Patient moving all extremities  Post vital signs: Reviewed and stable  Complications: No apparent anesthesia complications

## 2011-10-29 NOTE — Progress Notes (Signed)
   CARE MANAGEMENT NOTE 10/29/2011  Patient:  ARLOA, PRAK   Account Number:  [id number]  Date Initiated:  10/26/2011  Documentation initiated by:  Letha Cape  Subjective/Objective Assessment:   dx sbo  admit- lives with spouse.     Action/Plan:   pt eval- no pt f/u   Anticipated DC Date:  10/27/2011   Anticipated DC Plan:  HOME/SELF CARE      DC Planning Services  CM consult      Choice offered to / List presented to:             Status of service:  In process, will continue to follow Medicare Important Message given?   (If response is "NO", the following Medicare IM given date Tailor will be blank) Date Medicare IM given:   Date Additional Medicare IM given:    Discharge Disposition:    Per UR Regulation:    If discussed at Long Length of Stay Meetings, dates discussed:    Comments:  10/29/11- 1045- Donn Pierini RN, BSN [phone] Pt for OR today for exp. lap with small bowel resection, will f/u post operatively for potential d/c needs.  10/26/11 16:42 Letha Cape RN, BSN [phone] patient lives with spouse, per physical therapy pt has not pt needs.  NCM will continue to follow for dc needs. Patient has medication coverage and transportation.

## 2011-10-30 LAB — GLUCOSE, CAPILLARY: Glucose-Capillary: 130 mg/dL — ABNORMAL HIGH (ref 70–99)

## 2011-10-30 LAB — COMPREHENSIVE METABOLIC PANEL
AST: 22 U/L (ref 0–37)
Albumin: 2.1 g/dL — ABNORMAL LOW (ref 3.5–5.2)
Calcium: 8 mg/dL — ABNORMAL LOW (ref 8.4–10.5)
Creatinine, Ser: 0.71 mg/dL (ref 0.50–1.10)
GFR calc non Af Amer: 79 mL/min — ABNORMAL LOW (ref >90)

## 2011-10-30 LAB — CBC
MCH: 29.1 pg (ref 26.0–34.0)
MCHC: 34.6 g/dL (ref 30.0–36.0)
MCV: 84.3 fL (ref 78.0–100.0)
Platelets: 113 10*3/uL — ABNORMAL LOW (ref 150–400)
RDW: 15 % (ref 11.5–15.5)

## 2011-10-30 LAB — TYPE AND SCREEN
Antibody Screen: NEGATIVE
Unit division: 0

## 2011-10-30 MED ORDER — SODIUM CHLORIDE 0.9 % IV SOLN
Freq: Once | INTRAVENOUS | Status: AC
  Administered 2011-10-30: 01:00:00 via INTRAVENOUS

## 2011-10-30 MED ORDER — LORAZEPAM 2 MG/ML IJ SOLN
INTRAMUSCULAR | Status: AC
  Administered 2011-10-30: 0.5 mg via INTRAVENOUS
  Filled 2011-10-30: qty 1

## 2011-10-30 MED ORDER — PANTOPRAZOLE SODIUM 40 MG IV SOLR
40.0000 mg | INTRAVENOUS | Status: DC
  Administered 2011-10-30 – 2011-11-01 (×3): 40 mg via INTRAVENOUS
  Filled 2011-10-30 (×4): qty 40

## 2011-10-30 MED ORDER — LORAZEPAM 2 MG/ML IJ SOLN
0.5000 mg | Freq: Four times a day (QID) | INTRAMUSCULAR | Status: DC | PRN
  Administered 2011-10-30: 0.5 mg via INTRAVENOUS

## 2011-10-30 NOTE — Progress Notes (Signed)
D: Pt urinary output only 50 mL since last intervention 3 hours ago. A: Maneuvered catheter, repositioned patient, irrigated Foley catheter, was patent. R: Orders received and implemented. Will continue to monitor.

## 2011-10-30 NOTE — Progress Notes (Signed)
[facility] Progress Note 1 Day Post-Op  Subjective: Patient states that she is doing awful, but doing okay.  Hemodynamically stable.  Objective: Vital signs in last 24 hours: Temp:  [97.7 F (36.5 C)-98.4 F (36.9 C)] 97.9 F (36.6 C) (04/20 0800) Pulse Rate:  [75-105] 94  (04/20 0800) Resp:  [11-25] 16  (04/20 0800) BP: (109-177)/(49-112) 140/77 mmHg (04/20 0800) SpO2:  [90 %-100 %] 97 % (04/20 0800) Arterial Line BP: (153-194)/(65-80) 157/66 mmHg (04/19 1529) Weight:  [59.1 kg (130 lb 4.7 oz)] 59.1 kg (130 lb 4.7 oz) (04/19 1529) Last BM Date: 10/29/11  Intake/Output from previous day: 04/19 0701 - 04/20 0700 In: 4029 [I.V.:3329; Blood:700] Out: 345 [Urine:345] Intake/Output this shift: Total I/O In: 135 [I.V.:125; IV Piggyback:10] Out: 50 [Urine:50]  General: NAD  Lungs: Clear.  Oxygen saturations 95%  Abd: Moderately distended, some bowel sounds.  Mildly tender.  Incision intact  Extremities: No changes  Neuro: Intact  Lab Results:  @LABLAST2 (wbc:2,hgb:2,hct:2,plt:2) BMET  Basename 10/30/11 0500 10/29/11 0505  NA 138 137  K 3.6 3.4*  CL 108 106  CO2 23 24  GLUCOSE 158* 91  BUN 7 6  CREATININE 0.71 0.72  CALCIUM 8.0* 8.2*   PT/INR No results found for this basename: LABPROT:2,INR:2 in the last 72 hours ABG No results found for this basename: PHART:2,PCO2:2,PO2:2,HCO3:2 in the last 72 hours  Studies/Results: Dg Chest Port 1 View  10/28/2011  *RADIOLOGY REPORT*  Clinical Data: PICC line placement  PORTABLE CHEST - 1 VIEW  Comparison: September 16, 2010  Findings: The right upper extremity PICC line tip is at the cavoatrial junction.  No pneumothorax.  Cardiomegaly is unchanged. The mediastinum and pulmonary vasculature are within normal limits. Both lungs are clear.  IMPRESSION: PICC line tip is at the cavoatrial junction.  No pneumothorax.  Original Report Authenticated By: Brandon Melnick, M.D.    Anti-infectives: Anti-infectives     Start     Dose/Rate  Route Frequency Ordered Stop   10/29/11 1100   ertapenem (INVANZ) 1 g in sodium chloride 0.9 % 50 mL IVPB  Status:  Discontinued        1 g 100 mL/hr over 30 Minutes Intravenous  Once 10/29/11 1047 10/29/11 1517   10/29/11 0600   cefOXitin (MEFOXIN) 1 g in dextrose 5 % 50 mL IVPB     Comments: Please give on call to OR      1 g 100 mL/hr over 30 Minutes Intravenous  Once 10/28/11 0824     10/25/11 2215   metroNIDAZOLE (FLAGYL) IVPB 500 mg        500 mg 100 mL/hr over 60 Minutes Intravenous  Once 10/25/11 2213 10/26/11 0015   10/25/11 2215   ciprofloxacin (CIPRO) IVPB 400 mg        400 mg 200 mL/hr over 60 Minutes Intravenous  Once 10/25/11 2213 10/26/11 0359          Assessment/Plan: s/p Procedure(s): EXPLORATORY LAPAROTOMY d/c foley Advance diet Will advance diet tomorrow as tolerated.  Keep up with urine output   LOS: 5 days   Maria Lamas. Gae Bon, MD, FACS [phone] [facility] 10/30/2011

## 2011-10-30 NOTE — Progress Notes (Signed)
SUBJECTIVE:  Doing well s/p exploratory laparotomy.  Complains of abdominal pain but no chest pain  OBJECTIVE:   Vitals:   Filed Vitals:   10/30/11 0300 10/30/11 0315 10/30/11 0400 10/30/11 0800  BP:   129/109 140/77  Pulse:   102 94  Temp:  97.7 F (36.5 C)  97.9 F (36.6 C)  TempSrc:  Oral  Oral  Resp:   21 16  Height:      Weight:      SpO2: 94%  96% 97%   I&O's:   Intake/Output Summary (Last 24 hours) at 10/30/11 1104 Last data filed at 10/30/11 0800  Gross per 24 hour  Intake   3814 ml  Output    395 ml  Net   3419 ml   TELEMETRY: Reviewed telemetry pt in NSR:     PHYSICAL EXAM General: Well developed, well nourished, in no acute distress  Lungs:   Clear bilaterally to auscultation and percussion. Heart:   HRRR S1 S2 Pulses are 2+ & equal. Abdomen: Bowel sounds are positive Extremities:   No clubbing, cyanosis or edema.  DP +1 Neuro: Alert and oriented X 3. Psych:  Good affect, responds appropriately   LABS: Basic Metabolic Panel:  Basename 10/30/11 0500 10/29/11 0505  NA 138 137  K 3.6 3.4*  CL 108 106  CO2 23 24  GLUCOSE 158* 91  BUN 7 6  CREATININE 0.71 0.72  CALCIUM 8.0* 8.2*  MG -- --  PHOS -- --   Liver Function Tests:  Basename 10/30/11 0500  AST 22  ALT 10  ALKPHOS 53  BILITOT 1.4*  PROT 5.3*  ALBUMIN 2.1*   No results found for this basename: LIPASE:2,AMYLASE:2 in the last 72 hours CBC:  Basename 10/30/11 0500 10/29/11 0505  WBC 7.6 3.0*  NEUTROABS -- --  HGB 13.7 9.8*  HCT 39.6 28.7*  MCV 84.3 85.2  PLT 113* 107*    Lab Results  Component Value Date   INR 1.37 09/16/2010   INR 1.5 01/02/2009   INR 1.5 01/01/2009    RADIOLOGY: Ct Abdomen Pelvis W Contrast  10/26/2011  *RADIOLOGY REPORT*  Clinical Data: Abdominal distention and pain  CT ABDOMEN AND PELVIS WITH CONTRAST  Technique:  Multidetector CT imaging of the abdomen and pelvis was performed following the standard protocol during bolus administration of intravenous  contrast.  Contrast: 80mL OMNIPAQUE IOHEXOL 300 MG/ML  SOLN  Comparison: Radiograph 10/25/2011  Findings: Curvilinear scarring or atelectasis at the lung bases.  Streak artifact from right hip prosthesis is noted.  Moderate volume of low density ascites.  Dependent hyperdensity within the gallbladder may indicate sludge or previously administered contrast, potentially at an outside institution.  The liver is small in size with a mildly nodular contour.  Spleen is inhomogeneous with suggestion of sub centimeter hypodense lesions, for example image 16, incompletely characterized.  Adrenal glands, pancreas, and kidneys are normal.  Moderate sized hiatal hernia noted.  Small right fluid containing Bochdalek hernia.  No evidence of small bowel malrotation.  Gradual progression to mildly dilated small bowel loop caliber is noted. On images 29 and 30, there is apparent enteroenteric intussusception or possibly intraluminal mass at the proximal jejunum.  Contrast passes distal to this level, although multiple small bowel loops remain dilated within the left upper quadrant, with distal passage of contrast into decompressed ileum.  Colon is decompressed and unremarkable.  Scattered colonic diverticuli noted without evidence for diverticulitis.  Bladder is normal.  Right ovary is normal.  Left ovary and uterus presumed surgically absent.  No acute osseous finding.  IMPRESSION: Proximal partial small bowel obstruction to the level of small bowel (jejunum) intussusception and possibly intraluminal mass.  Moderate ascites.  Nodular contour and small liver size, which may be seen with cirrhosis in the appropriate context.  Findings called to the nurse practitioner Maren Reamer by Dr. Chilton Si on 10/26/2011 at 8:55 p.m.  Original Report Authenticated By: Harrel Lemon, M.D.   Dg Chest Port 1 View  10/28/2011  *RADIOLOGY REPORT*  Clinical Data: PICC line placement  PORTABLE CHEST - 1 VIEW  Comparison: September 16, 2010  Findings: The  right upper extremity PICC line tip is at the cavoatrial junction.  No pneumothorax.  Cardiomegaly is unchanged. The mediastinum and pulmonary vasculature are within normal limits. Both lungs are clear.  IMPRESSION: PICC line tip is at the cavoatrial junction.  No pneumothorax.  Original Report Authenticated By: Brandon Melnick, M.D.   Dg Abd 2 Views  10/27/2011  *RADIOLOGY REPORT*  Clinical Data: Abdominal distention, small bowel obstruction.  ABDOMEN - 2 VIEW  Comparison: 10/25/2011  Findings: Oral contrast material noted within decompressed colon. Mild prominence of the left abdominal small bowel loop again noted, likely not significantly changed.  No free air.  No organomegaly.  IMPRESSION: Continued prominence of left abdominal small bowel loop, not significantly changed.  Original Report Authenticated By: Cyndie Chime, M.D.   Dg Abd 2 Views  10/25/2011  *RADIOLOGY REPORT*  Clinical Data: Abdominal pain and bloating.  ABDOMEN - 2 VIEW  Comparison: None.  Findings: There are mildly dilated loops of small bowel in the left abdomen with a relative paucity of gas elsewhere.  Some air fluid levels are seen in association. Right hip arthroplasty.  IMPRESSION: Dilated small bowel loops in the left abdomen with a relative paucity of gas elsewhere.  Findings are nonspecific.  Difficult to exclude partial or early small bowel obstruction.  Original Report Authenticated By: Reyes Ivan, M.D.   Acute Abdominal Series  10/26/2011  *RADIOLOGY REPORT*  Clinical Data: Follow up bowel obstruction  ACUTE ABDOMEN SERIES (ABDOMEN 2 VIEW & CHEST 1 VIEW)  Comparison: 10/25/2011  Findings: The heart size and mediastinal contours are within normal limits. Coarsened interstitial markings are identified compatible with COPD.  Both lungs are clear.  The visualized skeletal structures are unremarkable.  Since the previous exam there is been interval improvement in dilated small bowel loops.  Right lower quadrant small  bowel measures up to 2.3 cm.  IMPRESSION:  1.  Interval improvement in small bowel obstruction pattern. 2.  COPD.  Original Report Authenticated By: Rosealee Albee, M.D.      ASSESSMENT:  1.  S/P exploratory laparotomy POD #1 2.  CAD s/p PCI with pericath CVA 1999 - clinically stable 3. HTN stable  PLAN:   1.  No new recs 2.  Continue nitrates  Quintella Reichert, MD  10/30/2011  11:04 AM

## 2011-10-30 NOTE — Progress Notes (Addendum)
Pt placed on bedside commode and unable to void; bladder scan completed and 450 ml of urine scanned in bladder; order received to insert foley; 100 ml of amber, clear urine returned; will continue to monitor

## 2011-10-30 NOTE — Progress Notes (Signed)
Patient ID: [name], female   DOB: [birth year], 78 y.o.   MRN: [id number]   PATIENT DETAILS  Name: Goldie Tregoning Age: 78 y.o. Sex: female Date of Birth: February 02, 1929 Admit Date: 10/25/2011 WUJ:[id number],NWGNF, MD, MD POA:   CONSULTS: General surgery [facility]  PROCEDURES: Exploratory laparotomy on 10/29/2011  Interim history:  No events overnight.  Subjective: Sleepy, no complaints about pain. Denies any flatus or bowel movements since yesterday.  Objective: Vital signs in last 24 hours: Temp:  [97.7 F (36.5 C)-98.4 F (36.9 C)] 98.4 F (36.9 C) (04/20 1200) Pulse Rate:  [77-102] 97  (04/20 1200) Resp:  [11-21] 21  (04/20 1200) BP: (109-158)/(49-112) 134/69 mmHg (04/20 1200) SpO2:  [90 %-100 %] 97 % (04/20 1200) Arterial Line BP: (157-167)/(66-75) 157/66 mmHg (04/19 1529) Weight:  [59.1 kg (130 lb 4.7 oz)] 59.1 kg (130 lb 4.7 oz) (04/19 1529) Weight change:  Last BM Date: 10/29/11  Intake/Output from previous day:  Intake/Output Summary (Last 24 hours) at 10/30/11 1331 Last data filed at 10/30/11 1200  Gross per 24 hour  Intake   2264 ml  Output    370 ml  Net   1894 ml     Physical Exam:  Gen:  Awake, alert in NAD, appears frail Cardiovascular:  S1S2 RRR, with SEM, no LE edema Respiratory: CTA, no increased wob Gastrointestinal: abdomen soft, distended,  Extremities: no c/c/e   Lab Results:  Lab 10/30/11 0500 10/29/11 0505 10/28/11 0542  HGB 13.7 9.8* 10.8*  HCT 39.6 28.7* 31.9*  WBC 7.6 3.0* 4.3  PLT 113* 107* 133*     Lab 10/30/11 0500 10/29/11 0505 10/27/11 0655 10/26/11 1503 10/26/11 0241  NA 138 137 137 139 139  K 3.6 3.4* -- -- --  CL 108 106 105 106 101  CO2 23 24 24 27 29   GLUCOSE 158* 91 105* 134* 94  BUN 7 6 7 9 11   CREATININE 0.71 0.72 0.71 0.74 0.79  CALCIUM 8.0* 8.2* 8.6 8.6 8.2*  MG -- -- -- 1.7 --  PHOS -- -- -- -- --    Studies/Results: No results found.  Medications: Scheduled Meds:    . sodium  chloride   Intravenous Once  . cefOXitin  1 g Intravenous Once  . feeding supplement  1 Container Oral TID BM  . fluocinonide cream  1 application Topical BID  . HYDROmorphone      . HYDROmorphone      . isosorbide mononitrate  30 mg Oral Daily  . labetalol      . montelukast  10 mg Oral Daily  . pantoprazole (PROTONIX) IV  40 mg Intravenous Q24H  . sodium chloride  10 mL Intracatheter Q12H  . DISCONTD: ertapenem  1 g Intravenous Once  . DISCONTD: pantoprazole  40 mg Oral Q1200  . DISCONTD: sodium chloride  3 mL Intravenous Q12H   Continuous Infusions:    . dextrose 5% lactated ringers 100 mL/hr at 10/30/11 0800  . DISCONTD: lactated ringers Stopped (10/29/11 2048)   PRN Meds:.acetaminophen, albuterol, LORazepam, LORazepam, morphine injection, ondansetron (ZOFRAN) IV, ondansetron, sodium chloride, zolpidem, DISCONTD: HYDROmorphone, DISCONTD: labetalol Antibiotics: Anti-infectives     Start     Dose/Rate Route Frequency Ordered Stop   10/29/11 1100   ertapenem (INVANZ) 1 g in sodium chloride 0.9 % 50 mL IVPB  Status:  Discontinued        1 g 100 mL/hr over 30 Minutes Intravenous  Once 10/29/11 1047 10/29/11 1517   10/29/11 0600  cefOXitin (MEFOXIN) 1 g in dextrose 5 % 50 mL IVPB     Comments: Please give on call to OR      1 g 100 mL/hr over 30 Minutes Intravenous  Once 10/28/11 0824     10/25/11 2215   metroNIDAZOLE (FLAGYL) IVPB 500 mg        500 mg 100 mL/hr over 60 Minutes Intravenous  Once 10/25/11 2213 10/26/11 0015   10/25/11 2215   ciprofloxacin (CIPRO) IVPB 400 mg        400 mg 200 mL/hr over 60 Minutes Intravenous  Once 10/25/11 2213 10/26/11 0359           Assessment/Plan:  Partial SBO. Intraluminal mass found on CT scan. Patient had exploratory laparotomy on 10/29/2011 4 intraluminal mass found on CT scan. Per surgical report no obvious causes for small bowel obstruction identified. The liver looks very cirrhotic and there is ascites. Ascitic fluid sent  to the lab.  Hypokalemia. Stable. Replete  CAD. Quiet and Stable during this admission.   Cirrhosis. New diagnosis on CT. LFTs with in normal limits. No h/o alcohol use. Uncertain of significance to her acute problem. Will follow-up with treatment of above issue.   Anxiety. Situational. Ativan started PRN   Hx asthma Will add prn nebs. Monitor. CXR done today without evidence of infiltrate.  Pancytopenia. Will monitor after surgery.  Could be associated with acute problem.  Hgb dropping.  Will guiac stool and monitor closely.   DVT Prophylaxis. lovenox  Clint Lipps Pager: [phone] 10/30/2011, 1:35 PM   LOS: 5 days    10/30/2011, 1:31 PM

## 2011-10-30 NOTE — Progress Notes (Signed)
D: Pt had low urinary output (160 mL clear/amber) in Foley catheter over 6 hours.  A: Maneuvered catheter tubing and repositioned patient with no results. Notified MD.  R: Orders received and implemented.  Will continue to monitor patient closely.

## 2011-10-31 ENCOUNTER — Inpatient Hospital Stay (HOSPITAL_COMMUNITY): Payer: Medicare Other

## 2011-10-31 LAB — BASIC METABOLIC PANEL
Chloride: 105 mEq/L (ref 96–112)
GFR calc Af Amer: >90 mL/min (ref >90)
GFR calc non Af Amer: 79 mL/min — ABNORMAL LOW (ref >90)
Glucose, Bld: 147 mg/dL — ABNORMAL HIGH (ref 70–99)
Potassium: 3.5 mEq/L (ref 3.5–5.1)
Sodium: 136 mEq/L (ref 135–145)

## 2011-10-31 LAB — PROTIME-INR: Prothrombin Time: 19.1 seconds — ABNORMAL HIGH (ref 11.6–15.2)

## 2011-10-31 LAB — FERRITIN: Ferritin: 159 ng/mL (ref 10–291)

## 2011-10-31 LAB — CBC
HCT: 39.4 % (ref 36.0–46.0)
Hemoglobin: 13.6 g/dL (ref 12.0–15.0)
RBC: 4.67 MIL/uL (ref 3.87–5.11)

## 2011-10-31 MED ORDER — ALUM & MAG HYDROXIDE-SIMETH 200-200-20 MG/5ML PO SUSP
15.0000 mL | Freq: Three times a day (TID) | ORAL | Status: DC | PRN
  Administered 2011-10-31 – 2011-11-02 (×2): 15 mL via ORAL
  Filled 2011-10-31 (×2): qty 30

## 2011-10-31 MED ORDER — WHITE PETROLATUM GEL
Status: AC
  Administered 2011-10-31: 17:00:00
  Filled 2011-10-31: qty 5

## 2011-10-31 NOTE — Progress Notes (Signed)
Pt continuing to have decreased urine output despite insertion of 35fr foley cath. Foley was flushed, manipulated, and assessed with no improvement with output. MD notified and no new orders received at this time. Will continue to monitor.

## 2011-10-31 NOTE — Progress Notes (Signed)
Pt transferred to [id number], per MD order. Report called to receiving nurse and all questions answered. Pt family member called and notified of room change.

## 2011-10-31 NOTE — Progress Notes (Signed)
2 Days Post-Op  Subjective: Wants to go home.  No flatus. HOH  Objective: Vital signs in last 24 hours: Temp:  [97.8 F (36.6 C)-98.6 F (37 C)] 98.1 F (36.7 C) (04/21 0800) Pulse Rate:  [96-109] 105  (04/21 0800) Resp:  [15-21] 15  (04/21 0800) BP: (121-147)/(66-75) 133/69 mmHg (04/21 0800) SpO2:  [89 %-97 %] 89 % (04/21 0800) Last BM Date: 10/29/11  Intake/Output from previous day: 04/20 0701 - 04/21 0700 In: 2553.3 [P.O.:120; I.V.:2423.3; IV Piggyback:10] Out: 450 [Urine:450] Intake/Output this shift:    Incision/Wound:Dressing with min drainage.  Ascites present.  Sore to palpation. Retaining urine  Lab Results:   Basename 10/31/11 0304 10/30/11 0500  WBC 10.9* 7.6  HGB 13.6 13.7  HCT 39.4 39.6  PLT 124* 113*   BMET  Basename 10/31/11 0304 10/30/11 0500  NA 136 138  K 3.5 3.6  CL 105 108  CO2 25 23  GLUCOSE 147* 158*  BUN 8 7  CREATININE 0.73 0.71  CALCIUM 8.4 8.0*   PT/INR  Basename 10/31/11 0304  LABPROT 19.1*  INR 1.57*   ABG No results found for this basename: PHART:2,PCO2:2,PO2:2,HCO3:2 in the last 72 hours  Studies/Results: No results found.  Anti-infectives: Anti-infectives     Start     Dose/Rate Route Frequency Ordered Stop   10/29/11 1100   ertapenem (INVANZ) 1 g in sodium chloride 0.9 % 50 mL IVPB  Status:  Discontinued        1 g 100 mL/hr over 30 Minutes Intravenous  Once 10/29/11 1047 10/29/11 1517   10/29/11 0600   cefOXitin (MEFOXIN) 1 g in dextrose 5 % 50 mL IVPB     Comments: Please give on call to OR      1 g 100 mL/hr over 30 Minutes Intravenous  Once 10/28/11 0824     10/25/11 2215   metroNIDAZOLE (FLAGYL) IVPB 500 mg        500 mg 100 mL/hr over 60 Minutes Intravenous  Once 10/25/11 2213 10/26/11 0015   10/25/11 2215   ciprofloxacin (CIPRO) IVPB 400 mg        400 mg 200 mL/hr over 60 Minutes Intravenous  Once 10/25/11 2213 10/26/11 0359          Assessment/Plan: s/p Procedure(s) (LRB): EXPLORATORY  LAPAROTOMY (N/A) Keep on clears until flatus. Can transfer per primary team.  Ascites returning.  May need paracentesis at some point given the amount of crystalloid she has received. Keep foley for now.  Given liver disease and ascites strict I AND O may be needed.  LOS: 6 days    Jet Traynham A. 10/31/2011

## 2011-11-01 ENCOUNTER — Encounter (HOSPITAL_COMMUNITY): Payer: Self-pay | Admitting: General Surgery

## 2011-11-01 DIAGNOSIS — K7460 Unspecified cirrhosis of liver: Secondary | ICD-10-CM | POA: Diagnosis present

## 2011-11-01 DIAGNOSIS — K561 Intussusception: Secondary | ICD-10-CM | POA: Insufficient documentation

## 2011-11-01 DIAGNOSIS — K7290 Hepatic failure, unspecified without coma: Secondary | ICD-10-CM | POA: Diagnosis present

## 2011-11-01 DIAGNOSIS — K567 Ileus, unspecified: Secondary | ICD-10-CM | POA: Diagnosis present

## 2011-11-01 LAB — COMPREHENSIVE METABOLIC PANEL
ALT: 10 U/L (ref 0–35)
AST: 20 U/L (ref 0–37)
Albumin: 2 g/dL — ABNORMAL LOW (ref 3.5–5.2)
Alkaline Phosphatase: 58 U/L (ref 39–117)
Potassium: 3.7 mEq/L (ref 3.5–5.1)
Sodium: 134 mEq/L — ABNORMAL LOW (ref 135–145)
Total Protein: 5.4 g/dL — ABNORMAL LOW (ref 6.0–8.3)

## 2011-11-01 LAB — CBC
HCT: 40.1 % (ref 36.0–46.0)
MCH: 29.9 pg (ref 26.0–34.0)
MCHC: 34.9 g/dL (ref 30.0–36.0)
MCV: 85.7 fL (ref 78.0–100.0)
RDW: 15.5 % (ref 11.5–15.5)

## 2011-11-01 MED ORDER — FUROSEMIDE 20 MG PO TABS
20.0000 mg | ORAL_TABLET | Freq: Every day | ORAL | Status: DC
  Administered 2011-11-01 – 2011-11-03 (×3): 20 mg via ORAL
  Filled 2011-11-01 (×3): qty 1

## 2011-11-01 MED ORDER — BISACODYL 10 MG RE SUPP
10.0000 mg | Freq: Every day | RECTAL | Status: DC
  Administered 2011-11-01 – 2011-11-03 (×2): 10 mg via RECTAL
  Filled 2011-11-01: qty 1

## 2011-11-01 MED ORDER — PSYLLIUM 95 % PO PACK
1.0000 | PACK | Freq: Two times a day (BID) | ORAL | Status: DC
  Administered 2011-11-01 – 2011-11-03 (×5): 1 via ORAL
  Filled 2011-11-01 (×6): qty 1

## 2011-11-01 MED ORDER — SPIRONOLACTONE 50 MG PO TABS
50.0000 mg | ORAL_TABLET | Freq: Every day | ORAL | Status: DC
  Administered 2011-11-01 – 2011-11-03 (×3): 50 mg via ORAL
  Filled 2011-11-01 (×3): qty 1

## 2011-11-01 MED ORDER — BISACODYL 10 MG RE SUPP
10.0000 mg | Freq: Two times a day (BID) | RECTAL | Status: DC | PRN
  Filled 2011-11-01: qty 1

## 2011-11-01 MED ORDER — SACCHAROMYCES BOULARDII 250 MG PO CAPS
250.0000 mg | ORAL_CAPSULE | Freq: Two times a day (BID) | ORAL | Status: DC
  Administered 2011-11-01 – 2011-11-03 (×5): 250 mg via ORAL
  Filled 2011-11-01 (×6): qty 1

## 2011-11-01 MED ORDER — METOPROLOL TARTRATE 12.5 MG HALF TABLET
12.5000 mg | ORAL_TABLET | Freq: Two times a day (BID) | ORAL | Status: DC
  Administered 2011-11-01 – 2011-11-03 (×5): 12.5 mg via ORAL
  Filled 2011-11-01 (×6): qty 1

## 2011-11-01 MED ORDER — PANTOPRAZOLE SODIUM 40 MG PO TBEC
40.0000 mg | DELAYED_RELEASE_TABLET | Freq: Every day | ORAL | Status: DC
  Administered 2011-11-02 – 2011-11-03 (×2): 40 mg via ORAL
  Filled 2011-11-01 (×2): qty 1

## 2011-11-01 MED ORDER — LORAZEPAM 0.5 MG PO TABS: 0.5000 mg | ORAL_TABLET | Freq: Three times a day (TID) | ORAL | Status: DC | PRN

## 2011-11-01 MED FILL — Hydromorphone HCl Inj 1 MG/ML: INTRAMUSCULAR | Qty: 1 | Status: AC

## 2011-11-01 NOTE — Discharge Instructions (Signed)
ABDOMINAL SURGERY: POST OP INSTRUCTIONS ° °1. DIET: Follow a light bland diet the first 24 hours after arrival home, such as soup, liquids, crackers, etc.  Be sure to include lots of fluids daily.  Avoid fast food or heavy meals as your are more likely to get nauseated.  Eat a low fat the next few days after surgery.   °2. Take your usually prescribed home medications unless otherwise directed. °3. PAIN CONTROL: °a. Pain is best controlled by a usual combination of three different methods TOGETHER: °i. Ice/Heat °ii. Over the counter pain medication °iii. Prescription pain medication °b. Most patients will experience some swelling and bruising around the incisions.  Ice packs or heating pads (30-60 minutes up to 6 times a day) will help. Use ice for the first few days to help decrease swelling and bruising, then switch to heat to help relax tight/sore spots and speed recovery.  Some people prefer to use ice alone, heat alone, alternating between ice & heat.  Experiment to what works for you.  Swelling and bruising can take several weeks to resolve.   °c. It is helpful to take an over-the-counter pain medication regularly for the first few weeks.  Choose one of the following that works best for you: °i. Naproxen (Aleve, etc)  Two 220mg tabs twice a day °ii. Ibuprofen (Advil, etc) Three 200mg tabs four times a day (every meal & bedtime) °iii. Acetaminophen (Tylenol, etc) 500-650mg four times a day (every meal & bedtime) °d. A  prescription for pain medication (such as oxycodone, hydrocodone, etc) should be given to you upon discharge.  Take your pain medication as prescribed.  °i. If you are having problems/concerns with the prescription medicine (does not control pain, nausea, vomiting, rash, itching, etc), please call us [phone] to see if we need to switch you to a different pain medicine that will work better for you and/or control your side effect better. °ii. If you need a refill on your pain medication,  please contact your pharmacy.  They will contact our office to request authorization. Prescriptions will not be filled after 5 pm or on week-ends. °4. Avoid getting constipated.  Between the surgery and the pain medications, it is common to experience some constipation.  Increasing fluid intake and taking a fiber supplement (such as Metamucil, Citrucel, FiberCon, MiraLax, etc) 1-2 times a day regularly will usually help prevent this problem from occurring.  A mild laxative (prune juice, Milk of Magnesia, MiraLax, etc) should be taken according to package directions if there are no bowel movements after 48 hours.   °5. Watch out for diarrhea.  If you have many loose bowel movements, simplify your diet to bland foods & liquids for a few days.  Stop any stool softeners and decrease your fiber supplement.  Switching to mild anti-diarrheal medications (Kayopectate, Pepto Bismol) can help.  If this worsens or does not improve, please call us. °6. Wash / shower every day.  You may shower over the incision / wound.  Avoid baths until the skin is fully healed.  Continue to shower over incision(s) after the dressing is off. °7. Remove your waterproof bandages 5 days after surgery.  You may leave the incision open to air.  You may replace a dressing/Band-Aid to cover the incision for comfort if you wish. °8. ACTIVITIES as tolerated:   °a. You may resume regular (light) daily activities beginning the next day--such as daily self-care, walking, climbing stairs--gradually increasing activities as tolerated.  If you can   walk 30 minutes without difficulty, it is safe to try more intense activity such as jogging, treadmill, bicycling, low-impact aerobics, swimming, etc. b. Save the most intensive and strenuous activity for last such as sit-ups, heavy lifting, contact sports, etc  Refrain from any heavy lifting or straining until you are off narcotics for pain control.   c. DO NOT PUSH THROUGH PAIN.  Let pain be your guide: If it  hurts to do something, don't do it.  Pain is your body warning you to avoid that activity for another week until the pain goes down. d. You may drive when you are no longer taking prescription pain medication, you can comfortably wear a seatbelt, and you can safely maneuver your car and apply brakes. e. Bonita Quin may have sexual intercourse when it is comfortable.  9. FOLLOW UP in our office a. Please call [facility] at [phone] to set up an appointment to see your surgeon in the office for a follow-up appointment approximately 1-2 weeks after your surgery. b. Make sure that you call for this appointment the day you arrive home to insure a convenient appointment time. 10. IF YOU HAVE DISABILITY OR FAMILY LEAVE FORMS, BRING THEM TO THE OFFICE FOR PROCESSING.  DO NOT GIVE THEM TO YOUR DOCTOR.   WHEN TO CALL us [phone]: 1. Poor pain control 2. Reactions / problems with new medications (rash/itching, nausea, etc)  3. Fever over 101.5 F (38.5 C) 4. Inability to urinate 5. Nausea and/or vomiting 6. Worsening swelling or bruising 7. Continued bleeding from incision. 8. Increased pain, redness, or drainage from the incision  The clinic staff is available to answer your questions during regular business hours (8:30am-5pm).  Please don't hesitate to call and ask to speak to one of our nurses for clinical concerns.   A surgeon from [facility] is always on call at the hospitals   If you have a medical emergency, go to the nearest emergency room or call 911.    [facility] [address], [place], [place]  [address] ? MAIN: [phone] ? TOLL FREE: [phone] ? FAX [phone] [email or web address]  Cirrhosis Cirrhosis is a condition of scarring of the liver which is caused when the liver has tried repairing itself following damage. This damage may come from a previous infection such as one of the forms of hepatitis (usually hepatitis  C), or the damage may come from being injured by toxins. The main toxin that causes this damage is alcohol. The scarring of the liver from use of alcohol is irreversible. That means the liver cannot return to normal even though alcohol is not used any more. The main danger of hepatitis C infection is that it may cause long-lasting (chronic) liver disease, and this also may lead to cirrhosis. This complication is progressive and irreversible. CAUSES  Prior to available blood tests, hepatitis C could be contracted by blood transfusions. Since testing of blood has improved, this is now unlikely. This infection can also be contracted through intravenous drug use and the sharing of needles. It can also be contracted through sexual relationships. The injury caused by alcohol comes from too much use. It is not a few drinks that poison the liver, but years of misuse. Usually there will be some signs and symptoms early with scarring of the liver that suggest the development of better habits. Alcohol should never be used while using acetaminophen. A small dose of both taken together may cause irreversible damage  to the liver. HOME CARE INSTRUCTIONS  There is no specific treatment for cirrhosis. However, there are things you can do to avoid making the condition worse.  Rest as needed.   Eat a well-balanced diet. Your caregiver can help you with suggestions.   Vitamin supplements including vitamins A, K, D, and thiamine can help.   A low-salt diet, water restriction, or diuretic medicine may be needed to reduce fluid retention.   Avoid alcohol. This can be extremely toxic if combined with acetaminophen.   Avoid drugs which are toxic to the liver. Some of these include isoniazid, methyldopa, acetaminophen, anabolic steroids (muscle-building drugs), erythromycin, and oral contraceptives (birth control pills). Check with your caregiver to make sure medicines you are presently taking will not be harmful.    Periodic blood tests may be required. Follow your caregiver's advice regarding the timing of these.   Milk thistle is an herbal remedy which does protect the liver against toxins. However, it will not help once the liver has been scarred.  SEEK MEDICAL CARE IF:  You have increasing fatigue or weakness.   You develop swelling of the hands, feet, legs, or face.   You vomit bright red blood, or a coffee ground appearing material.   You have blood in your stools, or the stools turn black and tarry.   You have a fever.   You develop loss of appetite, or have nausea and vomiting.   You develop jaundice.   You develop easy bruising or bleeding.   You have worsening of any of the problems you are concerned about.  Document Released: 06/28/2005 Document Revised: 06/17/2011 Document Reviewed: 02/14/2008 [facility] Patient Information 2012 [place], [place].

## 2011-11-01 NOTE — Progress Notes (Signed)
Patient ID: [name], female   DOB: May 02, 1929, 78 y.o.   MRN: [id number]   PATIENT DETAILS  Name: [name] Age: 78 y.o. Sex: female Date of Birth: February 20, 1929 Admit Date: 10/25/2011 XLK:GMWNUU,VOZDG, MD, MD POA:   CONSULTS: General surgery [place] [facility]  PROCEDURES: Exploratory laparotomy on 10/29/2011  Interim history:  Patient is very confused and frustrated about her treatment plan  Subjective: Denies any flatus or bowel movements since surgery.  Objective: Vital signs in last 24 hours: Temp:  [98.1 F (36.7 C)-98.2 F (36.8 C)] 98.1 F (36.7 C) (04/22 0500) Pulse Rate:  [103-122] 109  (04/22 0845) Resp:  [17-22] 20  (04/22 0500) BP: (126-147)/(70-80) 133/75 mmHg (04/22 0845) SpO2:  [92 %-98 %] 92 % (04/22 0500) Weight change:  Last BM Date: 10/29/11  Intake/Output from previous day:  Intake/Output Summary (Last 24 hours) at 11/01/11 1031 Last data filed at 11/01/11 0715  Gross per 24 hour  Intake    360 ml  Output    200 ml  Net    160 ml     Physical Exam:  Gen:  Awake, alert in NAD, appears frail Cardiovascular:  S1S2 RRR, with SEM, no LE edema Respiratory: CTA, no increased wob Gastrointestinal: abdomen soft, distended, very tense ascites. Extremities: no c/c/e   Lab Results:  Lab 11/01/11 0420 10/31/11 0304 10/30/11 0500  HGB 14.0 13.6 13.7  HCT 40.1 39.4 39.6  WBC 9.4 10.9* 7.6  PLT 137* 124* 113*     Lab 11/01/11 0420 10/31/11 0304 10/30/11 0500 10/29/11 0505 10/27/11 0655 10/26/11 1503  NA 134* 136 138 137 137 --  K 3.7 3.5 -- -- -- --  CL 102 105 108 106 105 --  CO2 24 25 23 24 24  --  GLUCOSE 107* 147* 158* 91 105* --  BUN 10 8 7 6 7  --  CREATININE 0.79 0.73 0.71 0.72 0.71 --  CALCIUM 8.4 8.4 8.0* 8.2* 8.6 --  MG -- -- -- -- -- 1.7  PHOS -- -- -- -- -- --    Studies/Results: Dg Chest Port 1 View  10/31/2011  *RADIOLOGY REPORT*  History:  Shortness of breath  Comparison:  10/28/2011  PORTABLE CHEST - 1 VIEW   Findings: Right-sided PICC line tip terminates over the cavoatrial junction.  Mild cardiomegaly persists.  Lung volumes are extremely low, with curvilinear bilateral lower lobe atelectasis.  Trace left pleural effusion or atelectasis.  Right glenohumeral joint degenerative change.  Mild diffuse gaseous prominence of loops of bowel project in the upper abdomen.  IMPRESSION: Extremely low lung volumes with bilateral lower lobe atelectasis and possible trace left pleural effusion. If the patient's symptoms continue, consider PA and lateral chest radiographs obtained at full inspiration when the patient is clinically able.  Original Report Authenticated By: Harrel Lemon, M.D.    Medications: Scheduled Meds:    . bisacodyl  10 mg Rectal Daily  . fluocinonide cream  1 application Topical BID  . furosemide  20 mg Oral Daily  . isosorbide mononitrate  30 mg Oral Daily  . montelukast  10 mg Oral Daily  . pantoprazole  40 mg Oral Q1200  . psyllium  1 packet Oral BID  . saccharomyces boulardii  250 mg Oral BID  . sodium chloride  10 mL Intracatheter Q12H  . spironolactone  50 mg Oral Daily  . white petrolatum      . DISCONTD: cefOXitin  1 g Intravenous Once  . DISCONTD: feeding supplement  1  Container Oral TID BM  . DISCONTD: pantoprazole (PROTONIX) IV  40 mg Intravenous Q24H   Continuous Infusions:    . DISCONTD: dextrose 5% lactated ringers 100 mL/hr at 10/31/11 0301   PRN Meds:.acetaminophen, albuterol, alum & mag hydroxide-simeth, bisacodyl, LORazepam, morphine injection, ondansetron (ZOFRAN) IV, ondansetron, sodium chloride, zolpidem, DISCONTD: LORazepam, DISCONTD: LORazepam Antibiotics: Anti-infectives     Start     Dose/Rate Route Frequency Ordered Stop   10/29/11 1100   ertapenem (INVANZ) 1 g in sodium chloride 0.9 % 50 mL IVPB  Status:  Discontinued        1 g 100 mL/hr over 30 Minutes Intravenous  Once 10/29/11 1047 10/29/11 1517   10/29/11 0600   cefOXitin (MEFOXIN) 1 g in  dextrose 5 % 50 mL IVPB  Status:  Discontinued     Comments: Please give on call to OR      1 g 100 mL/hr over 30 Minutes Intravenous  Once 10/28/11 0824 11/01/11 1026   10/25/11 2215   metroNIDAZOLE (FLAGYL) IVPB 500 mg        500 mg 100 mL/hr over 60 Minutes Intravenous  Once 10/25/11 2213 10/26/11 0015   10/25/11 2215   ciprofloxacin (CIPRO) IVPB 400 mg        400 mg 200 mL/hr over 60 Minutes Intravenous  Once 10/25/11 2213 10/26/11 0359           Assessment/Plan:  Partial SBO  -Intraluminal mass found on CT scan. Patient had exploratory laparotomy on 10/29/2011 -Per surgical report no obvious causes for small bowel obstruction identified, no masses. -The liver looks very cirrhotic and there is ascites. Ascitic fluid sent to the lab for cytology.  Hypokalemia -Stable. Repleted  CAD  -Quiet and Stable during this admission.  -She is tachycardic, I will obtain 12-lead EKG and start patient on low dose of beta blockers.  Cirrhosis.  -New diagnosis on CT. slightly elevated INR. -Unclear etiology, denies alcohol use, general surgery recommended GI evaluation. -I added Lasix and spironolactone, for her tense ascites.  Anxiety  -Situational. Ativan started PRN   Anemia and thrombocytopenia -Patient anemia is likely postoperative, status post transfusion of 2 units of packed RBCs. -Mild thrombocytopenia likely secondary to her chronic liver disease.    DVT Prophylaxis. lovenox  Clint Lipps Pager: [phone] 11/01/2011, 10:31 AM   LOS: 7 days    11/01/2011, 10:31 AM

## 2011-11-01 NOTE — Progress Notes (Signed)
[name] [id number] 06/09/1929  CARE TEAM:  PCP: [name], MD, MD  Outpatient Care Team: Patient Care Team: [name], MD as PCP - General (Family Medicine)  Inpatient Treatment Team: Treatment Team: Attending Provider: Clydia Llano, MD; Rounding Team: Simon Rhein, MD; Registered Nurse: Lemar Livings, RN; Consulting Physician: Bishop Limbo, MD; Consulting Physician: Dory [facility], MD; Registered Nurse: Floyce Stakes, RN; Registered Nurse: Toya Smothers, RN; Occupational Therapist: Alba Cory, OT  Subjective:  Nausea w resource Not walking much yet No flatus/BM Seen w KO PA & RN  Objective:  Vital signs:  Filed Vitals:   10/31/11 2100 10/31/11 2153 11/01/11 0500 11/01/11 0845  BP: 132/70  126/77 133/75  Pulse: 122  113 109  Temp: 98.2 F (36.8 C)  98.1 F (36.7 C)   TempSrc: Oral  Oral   Resp: 20  20   Height:      Weight:      SpO2: 92% 92% 92%     Last BM Date: 10/29/11  Intake/Output   Yesterday:  04/21 0701 - 04/22 0700 In: 800 [P.O.:600; I.V.:200] Out: 150 [Urine:150] This shift:  Total I/O In: -  Out: 50 [Urine:50]  Bowel function:  Flatus: n  BM: n  Physical Exam:  General: Pt awake/alert/oriented x4 in no acute distress Eyes: PERRL, normal EOM.  Sclera clear.  No icterus Neuro: CN II-XII intact w/o focal sensory/motor deficits. Lymph: No head/neck/groin lymphadenopathy Psych:  No delerium/psychosis/paranoia HENT: Normocephalic, Mucus membranes moist.  No thrush Neck: Supple, No tracheal deviation Chest: No chest wall pain w good excursion CV:  Pulses intact.  Regular rhythm Abdomen: Soft.  Mod distended.  Mildly tender at incisions only.  No incarcerated hernias. Ext:  SCDs BLE.  No mjr edema.  No cyanosis Skin: No petechiae / purpurae  Results:   Labs: Results for orders placed during the hospital encounter of 10/25/11 (from the past 48 hour(s))  BASIC METABOLIC PANEL     Status: Abnormal   Collection Time   10/31/11  3:04 AM      Component Value Range Comment   Sodium 136  135 - 145 (mEq/L)    Potassium 3.5  3.5 - 5.1 (mEq/L)    Chloride 105  96 - 112 (mEq/L)    CO2 25  19 - 32 (mEq/L)    Glucose, Bld 147 (*) 70 - 99 (mg/dL)    BUN 8  6 - 23 (mg/dL)    Creatinine, Ser 6.57  0.50 - 1.10 (mg/dL)    Calcium 8.4  8.4 - 10.5 (mg/dL)    GFR calc non Af Amer 79 (*) >90 (mL/min)    GFR calc Af Amer >90  >90 (mL/min)   CBC     Status: Abnormal   Collection Time   10/31/11  3:04 AM      Component Value Range Comment   WBC 10.9 (*) 4.0 - 10.5 (K/uL)    RBC 4.67  3.87 - 5.11 (MIL/uL)    Hemoglobin 13.6  12.0 - 15.0 (g/dL)    HCT 84.6  96.2 - 95.2 (%)    MCV 84.4  78.0 - 100.0 (fL)    MCH 29.1  26.0 - 34.0 (pg)    MCHC 34.5  30.0 - 36.0 (g/dL)    RDW 84.1  32.4 - 40.1 (%)    Platelets 124 (*) 150 - 400 (K/uL)   FERRITIN     Status: Normal   Collection Time   10/31/11  3:04  AM      Component Value Range Comment   Ferritin 159  10 - 291 (ng/mL)   PROTIME-INR     Status: Abnormal   Collection Time   10/31/11  3:04 AM      Component Value Range Comment   Prothrombin Time 19.1 (*) 11.6 - 15.2 (seconds)    INR 1.57 (*) 0.00 - 1.49    COMPREHENSIVE METABOLIC PANEL     Status: Abnormal   Collection Time   11/01/11  4:20 AM      Component Value Range Comment   Sodium 134 (*) 135 - 145 (mEq/L)    Potassium 3.7  3.5 - 5.1 (mEq/L)    Chloride 102  96 - 112 (mEq/L)    CO2 24  19 - 32 (mEq/L)    Glucose, Bld 107 (*) 70 - 99 (mg/dL)    BUN 10  6 - 23 (mg/dL)    Creatinine, Ser 1.19  0.50 - 1.10 (mg/dL)    Calcium 8.4  8.4 - 10.5 (mg/dL)    Total Protein 5.4 (*) 6.0 - 8.3 (g/dL)    Albumin 2.0 (*) 3.5 - 5.2 (g/dL)    AST 20  0 - 37 (U/L)    ALT 10  0 - 35 (U/L)    Alkaline Phosphatase 58  39 - 117 (U/L)    Total Bilirubin 2.3 (*) 0.3 - 1.2 (mg/dL)    GFR calc non Af Amer 77 (*) >90 (mL/min)    GFR calc Af Amer 89 (*) >90 (mL/min)   CBC     Status: Abnormal   Collection Time    11/01/11  4:20 AM      Component Value Range Comment   WBC 9.4  4.0 - 10.5 (K/uL)    RBC 4.68  3.87 - 5.11 (MIL/uL)    Hemoglobin 14.0  12.0 - 15.0 (g/dL)    HCT 14.7  82.9 - 56.2 (%)    MCV 85.7  78.0 - 100.0 (fL)    MCH 29.9  26.0 - 34.0 (pg)    MCHC 34.9  30.0 - 36.0 (g/dL)    RDW 13.0  86.5 - 78.4 (%)    Platelets 137 (*) 150 - 400 (K/uL)     Imaging / Studies: Dg Chest Port 1 View  10/31/2011  *RADIOLOGY REPORT*  History:  Shortness of breath  Comparison:  10/28/2011  PORTABLE CHEST - 1 VIEW  Findings: Right-sided PICC line tip terminates over the cavoatrial junction.  Mild cardiomegaly persists.  Lung volumes are extremely low, with curvilinear bilateral lower lobe atelectasis.  Trace left pleural effusion or atelectasis.  Right glenohumeral joint degenerative change.  Mild diffuse gaseous prominence of loops of bowel project in the upper abdomen.  IMPRESSION: Extremely low lung volumes with bilateral lower lobe atelectasis and possible trace left pleural effusion. If the patient's symptoms continue, consider PA and lateral chest radiographs obtained at full inspiration when the patient is clinically able.  Original Report Authenticated By: Harrel Lemon, M.D.    Medications / Allergies: per chart  Antibiotics: Anti-infectives     Start     Dose/Rate Route Frequency Ordered Stop   10/29/11 1100   ertapenem (INVANZ) 1 g in sodium chloride 0.9 % 50 mL IVPB  Status:  Discontinued        1 g 100 mL/hr over 30 Minutes Intravenous  Once 10/29/11 1047 10/29/11 1517   10/29/11 0600   cefOXitin (MEFOXIN) 1 g in dextrose 5 % 50 mL  IVPB     Comments: Please give on call to OR      1 g 100 mL/hr over 30 Minutes Intravenous  Once 10/28/11 0824     10/25/11 2215   metroNIDAZOLE (FLAGYL) IVPB 500 mg        500 mg 100 mL/hr over 60 Minutes Intravenous  Once 10/25/11 2213 10/26/11 0015   10/25/11 2215   ciprofloxacin (CIPRO) IVPB 400 mg        400 mg 200 mL/hr over 60 Minutes  Intravenous  Once 10/25/11 2213 10/26/11 0359          Problem List:  Principal Problem:  *Cirrhosis of liver Active Problems:  Ascites  Hepatic insufficiency  HYPERLIPIDEMIA-MIXED  Edema  CAD (coronary artery disease)  Ileus  Intussusception of jejunum on CT scan, resolved by ex lap   Assessment  [name]  78 y.o. female  3 Days Post-Op    Procedure(s): EXPLORATORY LAPAROTOMY with no mechanical cause of ileus  Ileus Hepatic insufficiency with cirrhosis  Plan:  -sips -VTE prophylaxis- SCDs, etc -mobilize as tolerated to help recovery PT/OT evals for prob SNF -needs tune up of liver: consider GI eval for cirrhosis -?needs paracentesis   Ardeth Sportsman, M.D., F.A.C.S. Gastrointestinal and Minimally Invasive Surgery [facility] [address] [place], [place] [address] [phone] Main / Paging [phone] Voice Mail   11/01/2011

## 2011-11-01 NOTE — Progress Notes (Signed)
Attempted to get patient to chair for dinner patient refused.

## 2011-11-01 NOTE — Consult Note (Signed)
Chart was reviewed and patient was examined. X-rays were reviewed.    Patient has cirrhosis as evidenced by nodularity of the liver head CT and a laparoscopy, hypoalbuminemia and ascites. Etiology is probably cryptogenic although chronic hepatitis, autoimmune hepatitis and primary biliary cirrhosis should be ruled out. Hemochromatosis is unlikely in the face of a normal ferritin.  Recommendations #1 diagnostic paracentesis; check for protein, LDH, cytology #2 check serologies for hepatitis B and C., and antimitochondrial antibody and antinuclear antibody     Barbette Hair. Arlyce Dice, M.D., [facility]

## 2011-11-01 NOTE — Evaluation (Signed)
Occupational Therapy Evaluation Patient Details Name: [name] MRN: [id number] DOB: 12/03/1928 Today's Date: 11/01/2011    OT Assessment / Plan / Recommendation Clinical Impression  Pt presents to OT with overall decreased ability to peform ADL activity s/p hospitalization.  Pt with decreased strength and endurance and will benefit from OT to increase I with ADL activity. Pt will likely need ST SNF or HHOT depending on  how quickly pt progresses.     OT Assessment  Patient needs continued OT Services    Follow Up Recommendations  Home health OT;Supervision/Assistance  24 hour OR Skilled nursing facility (depends on progress)    Equipment Recommendations    TBD   Frequency Min 2X/week    Precautions / Restrictions   fall  Pertinent Vitals/Pain No pain- just fatigue reported    ADL  Eating/Feeding: Performed Where Assessed - Eating/Feeding: Chair Grooming: Performed;Brushing hair Where Assessed - Grooming: Supported sitting Upper Body Bathing: Simulated;Moderate assistance Where Assessed - Upper Body Bathing: Supported;Sitting, chair Lower Body Bathing: Simulated;Maximal assistance Where Assessed - Lower Body Bathing: Sit to stand from chair Upper Body Dressing: Simulated;Minimal assistance Where Assessed - Upper Body Dressing: Sitting, chair;Supported Lower Body Dressing: Simulated;Maximal assistance Where Assessed - Lower Body Dressing: Sit to stand from chair Toilet Transfer: Simulated;Moderate assistance;Other (comment) (bed to chair transfer- stand turn) Toileting - Clothing Manipulation: Simulated;Moderate assistance Where Assessed - Toileting Clothing Manipulation: Standing Toileting - Hygiene: Simulated;Maximal assistance Where Assessed - Toileting Hygiene: Standing Tub/Shower Transfer: Not assessed Tub/Shower Transfer Method: Not assessed Ambulation Related to ADLs: Pt limited by fatigued. Pt sat quickly after 3 sit to stands, Explained importance of  reaching back for chair and using UE to lower self into chair    OT Goals Acute Rehab OT Goals OT Goal Formulation: With patient Time For Goal Achievement: 11/15/11 Potential to Achieve Goals: Good ADL Goals Pt Will Perform Grooming: with supervision;Standing at sink ADL Goal: Grooming - Progress: Goal set today Pt Will Perform Lower Body Dressing: with supervision;Sit to stand from chair ADL Goal: Lower Body Dressing - Progress: Goal set today Pt Will Perform Toileting - Clothing Manipulation: with supervision ADL Goal: Toileting - Clothing Manipulation - Progress: Goal set today Pt Will Perform Toileting - Hygiene: with supervision ADL Goal: Toileting - Hygiene - Progress: Goal set today        Prior Functioning  Home Living Lives With: Spouse Available Help at Discharge: Family Type of Home: House Home Access: Ramped entrance Home Layout: One level Bathroom Shower/Tub: [employer] manager Accessibility: Yes How Accessible: Accessible via walker Home Adaptive Equipment: Bedside commode/3-in-1;Walker - rolling;Wheelchair - powered Prior Function Level of Independence: Independent with assistive device(s) (amb with rolling walker) Driving: No Vocation: Retired Musician: No difficulties    Cognition  Overall Cognitive Status: Appears within functional limits for tasks assessed/performed Arousal/Alertness: Awake/alert Orientation Level: Appears intact for tasks assessed Behavior During Session: [facility] for tasks performed    Extremity/Trunk Assessment Right Upper Extremity Assessment RUE ROM/Strength/Tone: [facility] for tasks assessed Left Upper Extremity Assessment LUE ROM/Strength/Tone: WFL for tasks assessed   Mobility Bed Mobility Supine to Sit: 3: Mod assist Sitting - Scoot to Edge of Bed: 4: Min guard Transfers Transfers: Sit to Stand Sit to Stand: 4: Min guard;With upper extremity assist Stand to Sit: 4: Min guard;With upper extremity  assist Details for Transfer Assistance: Pt fatigued quickly.At home, pt uses a walker most of the time.         End of Session OT - End  of Session Activity Tolerance: Patient limited by fatigue Patient left: in chair Nurse Communication: Mobility status   Alba Cory 11/01/2011, 1:33 PM

## 2011-11-01 NOTE — Progress Notes (Signed)
Patient ID: [name], female   DOB: 20-Feb-1929, 78 y.o.   MRN: [id number]   PATIENT DETAILS  Name: [name] Age: 78 y.o. Sex: female Date of Birth: 06-11-1929 Admit Date: 10/25/2011 NWG:[id number],YQMVH, MD, MD POA:   CONSULTS: General surgery [place] [facility]  PROCEDURES: Exploratory laparotomy on 10/29/2011  Interim history:  No events overnight.  Subjective: No BM or flatus, decreased urine output reported by nursing staff.  Objective: Vital signs in last 24 hours: Temp:  [98.1 F (36.7 C)-98.2 F (36.8 C)] 98.1 F (36.7 C) (04/22 0500) Pulse Rate:  [103-122] 109  (04/22 0845) Resp:  [17-22] 20  (04/22 0500) BP: (126-147)/(70-80) 133/75 mmHg (04/22 0845) SpO2:  [92 %-98 %] 92 % (04/22 0500) Weight change:  Last BM Date: 10/29/11  Intake/Output from previous day:  Intake/Output Summary (Last 24 hours) at 11/01/11 1041 Last data filed at 11/01/11 0715  Gross per 24 hour  Intake    360 ml  Output    200 ml  Net    160 ml     Physical Exam:  Gen:  Awake, alert in NAD, appears frail Cardiovascular:  S1S2 RRR, with SEM, no LE edema Respiratory: CTA, no increased wob Gastrointestinal: abdomen soft, distended,  Extremities: no c/c/e   Lab Results:  Lab 11/01/11 0420 10/31/11 0304 10/30/11 0500  HGB 14.0 13.6 13.7  HCT 40.1 39.4 39.6  WBC 9.4 10.9* 7.6  PLT 137* 124* 113*     Lab 11/01/11 0420 10/31/11 0304 10/30/11 0500 10/29/11 0505 10/27/11 0655 10/26/11 1503  NA 134* 136 138 137 137 --  K 3.7 3.5 -- -- -- --  CL 102 105 108 106 105 --  CO2 24 25 23 24 24  --  GLUCOSE 107* 147* 158* 91 105* --  BUN 10 8 7 6 7  --  CREATININE 0.79 0.73 0.71 0.72 0.71 --  CALCIUM 8.4 8.4 8.0* 8.2* 8.6 --  MG -- -- -- -- -- 1.7  PHOS -- -- -- -- -- --    Studies/Results: Dg Chest Port 1 View  10/31/2011  *RADIOLOGY REPORT*  History:  Shortness of breath  Comparison:  10/28/2011  PORTABLE CHEST - 1 VIEW  Findings: Right-sided PICC line tip terminates  over the cavoatrial junction.  Mild cardiomegaly persists.  Lung volumes are extremely low, with curvilinear bilateral lower lobe atelectasis.  Trace left pleural effusion or atelectasis.  Right glenohumeral joint degenerative change.  Mild diffuse gaseous prominence of loops of bowel project in the upper abdomen.  IMPRESSION: Extremely low lung volumes with bilateral lower lobe atelectasis and possible trace left pleural effusion. If the patient's symptoms continue, consider PA and lateral chest radiographs obtained at full inspiration when the patient is clinically able.  Original Report Authenticated By: Harrel Lemon, M.D.    Medications: Scheduled Meds:    . bisacodyl  10 mg Rectal Daily  . fluocinonide cream  1 application Topical BID  . furosemide  20 mg Oral Daily  . isosorbide mononitrate  30 mg Oral Daily  . montelukast  10 mg Oral Daily  . pantoprazole  40 mg Oral Q1200  . psyllium  1 packet Oral BID  . saccharomyces boulardii  250 mg Oral BID  . sodium chloride  10 mL Intracatheter Q12H  . spironolactone  50 mg Oral Daily  . white petrolatum      . DISCONTD: cefOXitin  1 g Intravenous Once  . DISCONTD: feeding supplement  1 Container Oral TID BM  .  DISCONTD: pantoprazole (PROTONIX) IV  40 mg Intravenous Q24H   Continuous Infusions:    . DISCONTD: dextrose 5% lactated ringers 100 mL/hr at 10/31/11 0301   PRN Meds:.acetaminophen, albuterol, alum & mag hydroxide-simeth, bisacodyl, LORazepam, morphine injection, ondansetron (ZOFRAN) IV, ondansetron, sodium chloride, zolpidem, DISCONTD: LORazepam, DISCONTD: LORazepam Antibiotics: Anti-infectives     Start     Dose/Rate Route Frequency Ordered Stop   10/29/11 1100   ertapenem (INVANZ) 1 g in sodium chloride 0.9 % 50 mL IVPB  Status:  Discontinued        1 g 100 mL/hr over 30 Minutes Intravenous  Once 10/29/11 1047 10/29/11 1517   10/29/11 0600   cefOXitin (MEFOXIN) 1 g in dextrose 5 % 50 mL IVPB  Status:  Discontinued       Comments: Please give on call to OR      1 g 100 mL/hr over 30 Minutes Intravenous  Once 10/28/11 0824 11/01/11 1026   10/25/11 2215   metroNIDAZOLE (FLAGYL) IVPB 500 mg        500 mg 100 mL/hr over 60 Minutes Intravenous  Once 10/25/11 2213 10/26/11 0015   10/25/11 2215   ciprofloxacin (CIPRO) IVPB 400 mg        400 mg 200 mL/hr over 60 Minutes Intravenous  Once 10/25/11 2213 10/26/11 0359           Assessment/Plan:  Partial SBO. Intraluminal mass found on CT scan. Patient had exploratory laparotomy on 10/29/2011 4 intraluminal mass found on CT scan. Per surgical report no obvious causes for small bowel obstruction identified. The liver looks very cirrhotic and there is ascites. Ascitic fluid sent to the lab.  Hypokalemia. Stable. Replete  CAD. Quiet and Stable during this admission.   Cirrhosis. New diagnosis on CT. LFTs with in normal limits. No h/o alcohol use. Uncertain of significance to her acute problem. Will follow-up with treatment of above issue.   Anxiety. Situational. Ativan started PRN   Hx asthma Will add prn nebs. Monitor. CXR done today without evidence of infiltrate.  Pancytopenia. Will monitor after surgery.  Could be associated with acute problem.  Hgb dropping.  Will guiac stool and monitor closely.   DVT Prophylaxis. lovenox  Transfer to Harlon Ditty Pager: [phone] 11/01/2011, 10:41 AM   LOS: 7 days    11/01/2011, 10:41 AM

## 2011-11-01 NOTE — Consult Note (Addendum)
[facility] Consult: 1:00 PM 11/01/2011   Referring Provider: Arthor Captain    Primary Care Physician:  [name], MD, MD Primary Gastroenterologist:  Dr. Arlyce Dice,  Colonoscopy as inpt 09/2010  Reason for Consultation:  Cirrhosis.    HPI: [name] is a 78 y.o. female.  Had anemia and transfused 4 units blood in March 2012.  EGD showed a bleeding polyp at duodenal  Bulb which was band ligated.  Had multiple fudaly hyperplastic polyps.  Never had GI follow up. Plavix discontinued at that time.  She has CAD, PCI in 1999 with peri-cath CVA.    Admitted one week ago with SBO and CT scan with apparent intussusception and  possible intraluminal mass at proximal jejunum.  Incidental  Shrunken and nodular liver noted.   S/P ex lap on 10/29/11.  Dr Michaell Cowing did not see a mass or SBO but did see cirrhosis and ascites.  He took down adhesions at the anterior abdominal wall.  He aspirated ascites for albumin but lab did not receive this (I called).  Studies for cytology not official yet but showing no malignant cell, just reactive mesothelial cells. GB was normal.    She reports one week of progressive abdominal pain but no lack of BMs, nausea.   She had never received dx of cirrhosis.  No prior GI imaging studies before now.   The last previous LFTs in Epic were from 12/2008 and AST was 43, so minimally elevated, but o/w LFTs normal.   LFTs are normal. Coags are elevated with PT 19.1, INR 1.5 on 10/31/11.    No hx etoh abuse, minor social intake ceased 40 years ago.  No hx jaundice, hepatitis or abnormal LFTs.   Does bleed freely and bruise easily. No increase in abd girth until the week.  Weight loss of 5 # gradually over several months.  No anorexia.  No itching.  No dark urine .  No nose bleeds.    Generally frail as she has had spontaneous falls without syncope and requires walker, now elctric wheelchair for mobility.    Past Medical History  Diagnosis Date    . Coronary artery disease   . CVA (cerebral vascular accident)   . GERD (gastroesophageal reflux disease)   . Hypertension   . Femoral neck fracture 12/2008    right  . Asthma   . Hyperlipidemia   . Breast cancer     Past Surgical History  Procedure Date  . Hemiarthroplasty hip   . Cardiac catheterization   . Bilateral mastectomy   . Abdominal hysterectomy     Prior to Admission medications   Medication Sig Start Date End Date Taking? Authorizing Provider  fluocinonide cream (LIDEX) 0.05 % Apply 1 application topically 2 (two) times daily. To affected area 08/18/11  Yes Historical Provider, MD  montelukast (SINGULAIR) 10 MG tablet Take 10 mg by mouth at bedtime.    Yes Historical Provider, MD  nitroGLYCERIN (NITROSTAT) 0.4 MG SL tablet Place 0.4 mg under the tongue every 5 (five) minutes as needed. Chest pain 09/21/11  Yes Antonieta Iba, MD  omeprazole (PRILOSEC) 20 MG capsule Take 20 mg by mouth daily.    Yes Historical Provider, MD  potassium chloride (KLOR-CON) 10 MEQ CR tablet Take 10 mEq by mouth 2 (two) times daily.     Yes Historical Provider, MD  torsemide (DEMADEX) 20 MG tablet Take 20 mg by mouth 2 (two) times daily as needed. For swelling 09/21/11 09/20/12 Yes Antonieta Iba, MD  Scheduled Meds:    . bisacodyl  10 mg Rectal Daily  . fluocinonide cream  1 application Topical BID  . furosemide  20 mg Oral Daily  . isosorbide mononitrate  30 mg Oral Daily  . metoprolol tartrate  12.5 mg Oral BID  . montelukast  10 mg Oral Daily  . pantoprazole  40 mg Oral Q1200  . psyllium  1 packet Oral BID  . saccharomyces boulardii  250 mg Oral BID  . sodium chloride  10 mL Intracatheter Q12H  . spironolactone  50 mg Oral Daily  . white petrolatum       Infusions:   PRN Meds: acetaminophen, albuterol, alum & mag hydroxide-simeth, bisacodyl, LORazepam, morphine injection, ondansetron (ZOFRAN) IV, ondansetron, sodium chloride, zolpidem   Allergies as of 10/25/2011 -  Review Complete 10/25/2011  Allergen Reaction Noted  . Codeine  11/06/2009    Family History  Problem Relation Age of Onset  . Heart attack Son     x 2    History   Social History  . Marital Status: Married    Spouse Name: N/A    Number of Children: N/A  . Years of Education: N/A   Occupational History  . Not on file.   Social History Main Topics  . Smoking status: Never Smoker   . Smokeless tobacco: Never Used  . Alcohol Use: No  . Drug Use: No  . Sexually Active:    Other Topics Concern  . Not on file   Social History Narrative  . No narrative on file    REVIEW OF SYSTEMS: See Hpi.  14 systems reviewed. relevants above.  PHYSICAL EXAM: Vital signs in last 24 hours: Temp:  [98.1 F (36.7 C)-98.2 F (36.8 C)] 98.1 F (36.7 C) (04/22 0500) Pulse Rate:  [104-122] 111  (04/22 1126) Resp:  [20-22] 20  (04/22 0500) BP: (126-147)/(70-86) 139/86 mmHg (04/22 1126) SpO2:  [92 %-96 %] 92 % (04/22 0500)  General: looks chronically ill, thin. Head:  Some temporal  Wasting.  No facial assymetry  Eyes:  No icterus or conj pallor Ears:  Hearing ais in right ear.  Still HOH.  Nose:  No discharge or congestion Mouth:  Full set false teeth.  Moist , clear mm.  Tongue lists to left Neck:  No mass or JVD Lungs:  Fine rales B.  No cough Heart: RRR.  No MRG Breasts:  No breast tissue palpable B, but nipples intact and do not see surgical scars on either breast.   Abdomen:  Soft, protuberant, staples at long vertical scar is intact.  No leaking ascites.  Rectal: not done   Musc/Skeltl: arthritic deformity of hands, fingers, wrists Extremities:  No pedal edema  Neurologic:  Resting tremor.  Moves all 4s.  Unable to give hx but is oriented to place, self and not acutely confused Skin:  No rash or sores Heme:  Purpura on both arms Tattoos:  none Nodes:  None at neck or groin.   Psych:  Cooperative, a bit anxious.   Intake/Output from previous day: 04/21 0701 - 04/22  0700 In: 800 [P.O.:600; I.V.:200] Out: 150 [Urine:150] Intake/Output this shift: Total I/O In: -  Out: 150 [Urine:150]  LAB RESULTS:  Basename 11/01/11 0420 10/31/11 0304 10/30/11 0500  WBC 9.4 10.9* 7.6  HGB 14.0 13.6 13.7  HCT 40.1 39.4 39.6  PLT 137* 124* 113*   Ferritin   159    10/31/2011  BMET Lab Results  Component Value Date  NA 134* 11/01/2011   NA 136 10/31/2011   NA 138 10/30/2011   K 3.7 11/01/2011   K 3.5 10/31/2011   K 3.6 10/30/2011   CL 102 11/01/2011   CL 105 10/31/2011   CL 108 10/30/2011   CO2 24 11/01/2011   CO2 25 10/31/2011   CO2 23 10/30/2011   GLUCOSE 107* 11/01/2011   GLUCOSE 147* 10/31/2011   GLUCOSE 158* 10/30/2011   BUN 10 11/01/2011   BUN 8 10/31/2011   BUN 7 10/30/2011   CREATININE 0.79 11/01/2011   CREATININE 0.73 10/31/2011   CREATININE 0.71 10/30/2011   CALCIUM 8.4 11/01/2011   CALCIUM 8.4 10/31/2011   CALCIUM 8.0* 10/30/2011   LFT  Basename 11/01/11 0420 10/30/11 0500  PROT 5.4* 5.3*  ALBUMIN 2.0* 2.1*  AST 20 22  ALT 10 10  ALKPHOS 58 53  BILITOT 2.3* 1.4*  BILIDIR -- --  IBILI -- --   PT/INR Lab Results  Component Value Date   INR 1.57* 10/31/2011   INR 1.37 09/16/2010   INR 1.5 01/02/2009   Stool FOB:    Negative   10/28/2011  Ascites studies for albumin from 10/31/11  Hepatitis Panel No results found for this basename: HEPBSAG,HCVAB,HEPAIGM,HEPBIGM in the last 72 hours   RADIOLOGY STUDIES: Dg Chest Port 1 View  10/31/2011  *RADIOLOGY REPORT*  History:  Shortness of breath  Comparison:  10/28/2011  PORTABLE CHEST - 1 VIEW  Findings: Right-sided PICC line tip terminates over the cavoatrial junction.  Mild cardiomegaly persists.  Lung volumes are extremely low, with curvilinear bilateral lower lobe atelectasis.  Trace left pleural effusion or atelectasis.  Right glenohumeral joint degenerative change.  Mild diffuse gaseous prominence of loops of bowel project in the upper abdomen.  IMPRESSION: Extremely low lung volumes with bilateral  lower lobe atelectasis and possible trace left pleural effusion. If the patient's symptoms continue, consider PA and lateral chest radiographs obtained at full inspiration when the patient is clinically able.  Original Report Authenticated By: Harrel Lemon, M.D.    ENDOSCOPIC STUDIES: 09/19/2010   For heme positive anemia Friable, intermittently bleeding polyp at dudenal bulb:  Band ligated Multiple benign looking fundal polyps.  Pathology: hyperplastic.   Stricture at GE jx.  Non obstructing.   Refused colonooscopy in past.  IMPRESSION: 1.  New diagnosis of cirrhosis in frail-appearing, elderly female.  No significant etoh hx, no prior hx hepatitis that I can find. Ferritin not elevated which rules out hemochromatosis.  Suspect this will turn out to be cryptogenic.    2.  CAD.  Abnormal stress test 2011.  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  Pro BNP is 68.4, so does not have significant CHF so likely no cardiac etiology to the cirrhosis. 3.  Coagulopathy 4.  Hx bleeding duodenal polyp and associated transfusion requiring anemia 09/2010.  No evidence of portal htn, varices etc then.   5.  S/p ex lap.  Failed to demonstrate SBO or intraluminal mass.   PLAN: Check viral hepatitis serologies, ANA, AMA.  Ferritin is normal, will not recheck. Await the official reading of ascitic cytologies.     LOS: 7 days   Jennye Moccasin  11/01/2011, 1:00 PM Pager: [phone]

## 2011-11-01 NOTE — Progress Notes (Signed)
Attempted to get patient out of bed to chair she refused again.

## 2011-11-01 NOTE — Progress Notes (Signed)
INITIAL ADULT NUTRITION ASSESSMENT Date: 11/01/2011   Time: 4:01 PM  Reason for Assessment: NPO/Clear Liquids   ASSESSMENT: Female 78 y.o.  Dx: Cirrhosis of liver, small bowel mass  Hx:  Past Medical History  Diagnosis Date  . Coronary artery disease   . CVA (cerebral vascular accident)   . GERD (gastroesophageal reflux disease)   . Hypertension   . Femoral neck fracture 12/2008    right  . Asthma   . Hyperlipidemia   . Breast cancer     Related Meds:     . bisacodyl  10 mg Rectal Daily  . fluocinonide cream  1 application Topical BID  . furosemide  20 mg Oral Daily  . isosorbide mononitrate  30 mg Oral Daily  . metoprolol tartrate  12.5 mg Oral BID  . montelukast  10 mg Oral Daily  . pantoprazole  40 mg Oral Q1200  . psyllium  1 packet Oral BID  . saccharomyces boulardii  250 mg Oral BID  . sodium chloride  10 mL Intracatheter Q12H  . spironolactone  50 mg Oral Daily  . white petrolatum      . DISCONTD: cefOXitin  1 g Intravenous Once  . DISCONTD: feeding supplement  1 Container Oral TID BM  . DISCONTD: pantoprazole (PROTONIX) IV  40 mg Intravenous Q24H    Ht: 5' (152.4 cm)  Wt: 130 lb 4.7 oz (59.1 kg)  Ideal Wt: 45.4 kg % Ideal Wt: 130%  Usual Wt: 138 lb % Usual Wt: 94%  Body mass index is 25.45 kg/(m^2).  Food/Nutrition Related Hx: no triggers per admission nutrition screen  Labs:  CMP     Component Value Date/Time   NA 134* 11/01/2011 0420   K 3.7 11/01/2011 0420   CL 102 11/01/2011 0420   CO2 24 11/01/2011 0420   GLUCOSE 107* 11/01/2011 0420   BUN 10 11/01/2011 0420   CREATININE 0.79 11/01/2011 0420   CALCIUM 8.4 11/01/2011 0420   PROT 5.4* 11/01/2011 0420   ALBUMIN 2.0* 11/01/2011 0420   AST 20 11/01/2011 0420   ALT 10 11/01/2011 0420   ALKPHOS 58 11/01/2011 0420   BILITOT 2.3* 11/01/2011 0420   GFRNONAA 77* 11/01/2011 0420   GFRAA 89* 11/01/2011 0420     Intake/Output Summary (Last 24 hours) at 11/01/11 1602 Last data filed at 11/01/11 1100  Gross per 24 hour  Intake      0 ml  Output    150 ml  Net   -150 ml    CBG (last 3)   Basename 10/30/11 0219  GLUCAP 130*    Diet Order: Clear Liquids -- ADAT per MD  Supplements/Tube Feeding: N/A  IVF: N/A  Estimated Nutritional Needs:   Kcal: 1500-1700 Protein: 70-80 gm Fluid: 1.5-1.7 L  RD briefly spoke with pt and pt's husbands; pt reports having a good appetite PTA; s/p exploratory laparotomy, lysis of adhesions, peritoneal washings 4/19; noted Resource Breeze supplement in pt's room, not ordered in order management; pt states it makes her feel sick; she reports a 6-8 lb weight loss; does not know time frame; pt at nutrition risk given post-op, prolonged NPO/Clear Liquid status  NUTRITION DIAGNOSIS: -Inadequate protein energy intake (NI-5.3).  Status: Ongoing  RELATED TO: altered GI function  AS EVIDENCE BY: inability to advance past Clear Liquids  MONITORING/EVALUATION(Goals): Goal: meet >90% of estimated nutrition needs to promote post-op healing Monitor: Diet advancement, PO intake, weight, labs, I/O's  EDUCATION NEEDS: -No education needs identified at this time  INTERVENTION:  Advance diet as medically appropriate  RD to follow for nutrition care plan, add interventions/make recommendations accordingly  Dietitian #: [phone]  DOCUMENTATION CODES Per approved criteria  -Not Applicable    Alger Memos 11/01/2011, 4:01 PM

## 2011-11-02 ENCOUNTER — Inpatient Hospital Stay (HOSPITAL_COMMUNITY): Payer: Medicare Other

## 2011-11-02 LAB — BODY FLUID CELL COUNT WITH DIFFERENTIAL
Lymphs, Fluid: 13 %
Neutrophil Count, Fluid: 25 % (ref 0–25)
Total Nucleated Cell Count, Fluid: 327 cu mm (ref 0–1000)

## 2011-11-02 LAB — BASIC METABOLIC PANEL
CO2: 24 mEq/L (ref 19–32)
Chloride: 104 mEq/L (ref 96–112)
Creatinine, Ser: 0.87 mg/dL (ref 0.50–1.10)
GFR calc Af Amer: 71 mL/min — ABNORMAL LOW (ref >90)
Potassium: 4 mEq/L (ref 3.5–5.1)

## 2011-11-02 NOTE — Progress Notes (Signed)
Patient: [name] Date of Encounter: 11/02/2011, 7:45 AM Admit date: 10/25/2011     Subjective  Appears somewhat uncomfortable but tells me she has no complaints and "they told me I could go home today." ? Belly still distended. No CP or SOB.   Objective   Telemetry: stable, NSR occasional PVCs Physical Exam: Filed Vitals:   11/01/11 2300  BP: 137/68  Pulse: 97  Temp: 98.4  Resp: 20   General: Frail appearing elderly WF in no acute distress. Head: Normocephalic, atraumatic, sclera non-icteric, no xanthomas, nares are without discharge.  Neck: No obvious lymphadenopathy. JVD not elevated. Lungs: Decreased BS in bases, coarse otherwise. She is open-mouth breathing but not laboring. Heart: RRR slightly increased rate S1 S2 without murmurs, rubs, or gallops.  Abdomen: Soft, round and distended with normoactive bowel sounds. No rebound/guarding. No obvious abdominal masses. Msk:  Strength is normal for age, generalized muscle atrophy noted. Extremities: No clubbing or cyanosis. No edema.  Distal pedal pulses are 2+ and equal bilaterally. Scattered ecchymosis UE's noted. Neuro: Alert and oriented X almost 3 (was close to date). Hard of hearing. Moves all extremities spontaneously. Psych:  Responds to questions appropriately with a normal affect.    Intake/Output Summary (Last 24 hours) at 11/02/11 0745 Last data filed at 11/01/11 1100  Gross per 24 hour  Intake      0 ml  Output    100 ml  Net   -100 ml    Inpatient Medications:    . bisacodyl  10 mg Rectal Daily  . fluocinonide cream  1 application Topical BID  . furosemide  20 mg Oral Daily  . isosorbide mononitrate  30 mg Oral Daily  . metoprolol tartrate  12.5 mg Oral BID  . montelukast  10 mg Oral Daily  . pantoprazole  40 mg Oral Q1200  . psyllium  1 packet Oral BID  . saccharomyces boulardii  250 mg Oral BID  . sodium chloride  10 mL Intracatheter Q12H  . spironolactone  50 mg Oral Daily  . DISCONTD:  cefOXitin  1 g Intravenous Once  . DISCONTD: feeding supplement  1 Container Oral TID BM  . DISCONTD: pantoprazole (PROTONIX) IV  40 mg Intravenous Q24H    Labs:  Basename 11/02/11 0450 11/01/11 0420  NA 135 134*  K 4.0 3.7  CL 104 102  CO2 24 24  GLUCOSE 87 107*  BUN 14 10  CREATININE 0.87 0.79  CALCIUM 8.4 8.4  MG -- --  PHOS -- --    Basename 11/01/11 0420  AST 20  ALT 10  ALKPHOS 58  BILITOT 2.3*  PROT 5.4*  ALBUMIN 2.0*     Basename 11/01/11 0420 10/31/11 0304  WBC 9.4 10.9*  NEUTROABS -- --  HGB 14.0 13.6  HCT 40.1 39.4  MCV 85.7 84.4  PLT 137* 124*    Basename 10/31/11 0304  VITAMINB12 --  FOLATE --  FERRITIN 159  TIBC --  IRON --  RETICCTPCT --    Radiology/Studies:  1. Ct Abdomen Pelvis W Contrast 10/26/2011  *RADIOLOGY REPORT*  Clinical Data: Abdominal distention and pain  CT ABDOMEN AND PELVIS WITH CONTRAST  Technique:  Multidetector CT imaging of the abdomen and pelvis was performed following the standard protocol during bolus administration of intravenous contrast.  Contrast: 80mL OMNIPAQUE IOHEXOL 300 MG/ML  SOLN  Comparison: Radiograph 10/25/2011  Findings: Curvilinear scarring or atelectasis at the lung bases.  Streak artifact from right hip prosthesis is noted.  Moderate volume of low density ascites.  Dependent hyperdensity within the gallbladder may indicate sludge or previously administered contrast, potentially at an outside institution.  The liver is small in size with a mildly nodular contour.  Spleen is inhomogeneous with suggestion of sub centimeter hypodense lesions, for example image 16, incompletely characterized.  Adrenal glands, pancreas, and kidneys are normal.  Moderate sized hiatal hernia noted.  Small right fluid containing Bochdalek hernia.  No evidence of small bowel malrotation.  Gradual progression to mildly dilated small bowel loop caliber is noted. On images 29 and 30, there is apparent enteroenteric intussusception or possibly  intraluminal mass at the proximal jejunum.  Contrast passes distal to this level, although multiple small bowel loops remain dilated within the left upper quadrant, with distal passage of contrast into decompressed ileum.  Colon is decompressed and unremarkable.  Scattered colonic diverticuli noted without evidence for diverticulitis.  Bladder is normal.  Right ovary is normal.  Left ovary and uterus presumed surgically absent.  No acute osseous finding.  IMPRESSION: Proximal partial small bowel obstruction to the level of small bowel (jejunum) intussusception and possibly intraluminal mass.  Moderate ascites.  Nodular contour and small liver size, which may be seen with cirrhosis in the appropriate context.  Findings called to the nurse practitioner Maren Reamer by Dr. Chilton Si on 10/26/2011 at 8:55 p.m.  Original Report Authenticated By: Harrel Lemon, M.D.   2. Chest Port 1 View 10/31/2011  *RADIOLOGY REPORT*  History:  Shortness of breath  Comparison:  10/28/2011  PORTABLE CHEST - 1 VIEW  Findings: Right-sided PICC line tip terminates over the cavoatrial junction.  Mild cardiomegaly persists.  Lung volumes are extremely low, with curvilinear bilateral lower lobe atelectasis.  Trace left pleural effusion or atelectasis.  Right glenohumeral joint degenerative change.  Mild diffuse gaseous prominence of loops of bowel project in the upper abdomen.  IMPRESSION: Extremely low lung volumes with bilateral lower lobe atelectasis and possible trace left pleural effusion. If the patient's symptoms continue, consider PA and lateral chest radiographs obtained at full inspiration when the patient is clinically able.  Original Report Authenticated By: Harrel Lemon, M.D.     Assessment and Plan  1. POD#4 of exploratory laparotomy - initially done because of possible mass seen on CT but op note reports no mass or SBO found, but liver appeared cirrhotic which is new dx for pt - workup in progress per IM/GI.  2.  Cirrhosis with coagulopathy - as above.  3. H/o CAD s/p PCI 1999 with pericath CVA, abnl stress test 2011 but patient refused cath/ASA/statins - not on Plavix due to hx of GIB. Her CAD has been quiescent this admission. Abdominal bloating felt due to cirrhosis at this time. Continue nitrates, BB. No new recs.  4. HTN - stable.  Signed, Ronie Spies PA-C Agree with above assessment. She is hard of hearing.  Denies any chest discomfort. Exam reveals mild expiratory wheezing but she is lying flat in no distress. Will get a good PA and lateral chest xray this am.

## 2011-11-02 NOTE — Progress Notes (Signed)
Occupational Therapy Treatment Patient Details Name: Yezenia Fredrick MRN: [id number] DOB: [birth year] Today's Date: 11/02/2011     Precautions / Restrictions Precautions Precautions: Fall   Pertinent Vitals/Pain No pain    ADL  Grooming: Performed;Brushing hair;Wash/dry face Where Assessed - Grooming: Unsupported sitting Toilet Transfer: Performed;Minimal assistance Toilet Transfer Method: Surveyor, minerals: Bedside commode Toileting - Clothing Manipulation: Performed;Minimal assistance Where Assessed - Glass blower/designer Manipulation: Standing Toileting - Hygiene: Performed;Minimal assistance Where Assessed - Toileting Hygiene: Standing ADL Comments: Pt fatigued quickly, but did better than yesterday. Pt insistent she is going home today.  Not sure pt strong enough to go home with her husband.  Shared concern with pt and husband.  Pt continues to be very focused on going home..    OT Goals ADL Goals ADL Goal: Grooming - Progress: Progressing toward goals ADL Goal: Toileting - Clothing Manipulation - Progress: Progressing toward goals ADL Goal: Toileting - Hygiene - Progress: Progressing toward goals           Cognition  Arousal/Alertness: Awake/alert Behavior During Session: Anxious Cognition - Other Comments: decreased safety awareness with DC planning and actual assist she needs at this time    Mobility Bed Mobility Supine to Sit: 3: Mod assist Sitting - Scoot to Edge of Bed: 4: Min guard Transfers Transfers: Sit to Stand Sit to Stand: 4: Min guard;With upper extremity assist Stand to Sit: 4: Min guard;With upper extremity assist Details for Transfer Assistance: Pt fatigued quickly.At home, pt uses a walker most of the time.         End of Session  Pt left in chair with call bell and husband present   Mayleen Borrero, [place] Kung 11/02/2011, 11:46 AM

## 2011-11-02 NOTE — Progress Notes (Signed)
Physical Therapy Treatment Patient Details Name: [name] MRN: [id number] DOB: [birth year] Today's Date: 11/02/2011 Time: 4098-1191 PT Time Calculation (min): 28 min  PT Assessment / Plan / Recommendation Comments on Treatment Session  Pt agreeable to PT today, but with flat affect, wanting to go home. Pt performs mobility with up to Min A for safety, but has deficits with gait and balance. Pt will benefit from SNF at DC for optimal safety at home. Tx today focused on static balance and gait.     Follow Up Recommendations  Skilled nursing facility    Equipment Recommendations  Defer to next venue    Frequency Min 3X/week   Plan Discharge plan needs to be updated    Precautions / Restrictions Precautions Precautions: Fall Restrictions Weight Bearing Restrictions: No   Pertinent Vitals/Pain No complaints    Mobility  Bed Mobility Bed Mobility: Sit to Supine;Scooting to HOB Supine to Sit: 3: Mod assist Sitting - Scoot to Edge of Bed: 4: Min guard Sit to Supine: 5: Supervision;HOB flat;With rail Scooting to [facility]: 3: Mod assist Details for Bed Mobility Assistance: Cues for hooklying technique scooting to [facility] with assist advancing at hips Transfers Transfers: Sit to Stand;Stand to Sit Sit to Stand: 4: Min guard;With upper extremity assist;From bed;From chair/3-in-1;With armrests Stand to Sit: 4: Min guard;With upper extremity assist;To bed;To chair/3-in-1;With armrests Details for Transfer Assistance: Attempted sit<>stand without UEs, but pt unable. Min-guard for steadying and safety.  Ambulation/Gait Ambulation/Gait Assistance: 4: Min assist Ambulation Distance (Feet): 200 Feet Assistive device: Rolling walker Ambulation/Gait Assistance Details: Min A for steadying as pt collided with objects in busy hall x2. Pt given verbal cues to increase gait speed as much as safely able, but pt unable to make a significant change in pace. Cues also for position in RW with turns.    Gait Pattern: Step-through pattern;Decreased stride length;Trunk flexed Gait velocity: 0.67 ft/sec (when asked to go as fast as safely can) Stairs: No Wheelchair Mobility Wheelchair Mobility: No    Exercises Other Exercises Other Exercises: Repeated sit<>stand x5 with UE assist needed for bil LE functional strengthening   PT Goals Acute Rehab PT Goals Potential to Achieve Goals: Good PT Goal: Sit to Supine/Side - Progress: Met PT Goal: Sit to Stand - Progress: Progressing toward goal PT Goal: Stand to Sit - Progress: Progressing toward goal PT Goal: Ambulate - Progress: Progressing toward goal  Visit Information  Last PT Received On: 11/02/11    Subjective Data  Subjective: Pt eager to go home.    Cognition  Overall Cognitive Status: Appears within functional limits for tasks assessed/performed Arousal/Alertness: Awake/alert Orientation Level: Appears intact for tasks assessed Behavior During Session: [facility] for tasks performed Cognition - Other Comments: Decreased awareness of deficits    Balance  Balance Balance Assessed: Yes Static Standing Balance Static Standing - Balance Support: No upper extremity supported Static Standing - Level of Assistance: 4: Min assist Dynamic Standing Balance Dynamic Standing - Balance Support: No upper extremity supported Dynamic Standing - Level of Assistance: 3: Mod assist Dynamic Standing - Balance Activities: Lateral lean/weight shifting;Forward lean/weight shifting;Reaching for objects;Reaching across midline Dynamic Standing - Comments: Also performed marching in place x10 bil  End of Session PT - End of Session Equipment Utilized During Treatment: Gait belt Activity Tolerance: Patient tolerated treatment well Patient left: in bed;with call bell/phone within reach;with bed alarm set Nurse Communication: Mobility status    Virl Cagey, PT [phone]  11/02/2011, 1:20 PM

## 2011-11-02 NOTE — Progress Notes (Signed)
Utilization review complete 

## 2011-11-02 NOTE — Progress Notes (Signed)
Clinical social worker met with pt at bedside to discuss current living environment and pt dc plans. Pt states she lives at home with her spouse who provides all care and helps around the house. Pt refused snf as recommended and home health. Pt agreed to talk further with RN case manager regarding home. CSW informed RN case manager regarding home health needs.   .No further Clinical Social Work needs, signing off.   Catha Gosselin, Theresia Majors  [phone] .11/02/2011 1719pm

## 2011-11-02 NOTE — Progress Notes (Signed)
At some point she should have EGD to evaluate for varices.  This could be done as an inpt or outpatient.

## 2011-11-02 NOTE — Progress Notes (Addendum)
Patient ID: [name], female   DOB: [birth year], 78 y.o.   MRN: [id number]   PATIENT DETAILS  Name: [name] Age: 78 y.o. Sex: female Date of Birth: 1929/06/21 Admit Date: 10/25/2011 NWG:[id number],YQMVH, MD, MD POA:   CONSULTS: General surgery [facility]  PROCEDURES: Exploratory laparotomy on 10/29/2011  Interim history:  No events overnight.  Subjective: Patient wants to go home, she is walking around she reported small bowel movement.  Objective: Vital signs in last 24 hours: Temp:  [98 F (36.7 C)-98.4 F (36.9 C)] 98.4 F (36.9 C) (04/22 2100) Pulse Rate:  [94-102] 98  (04/23 1024) Resp:  [20] 20  (04/22 2100) BP: (116-141)/(54-82) 116/56 mmHg (04/23 1024) SpO2:  [93 %-95 %] 94 % (04/22 2100) Weight change:  Last BM Date: 11/02/11  Intake/Output from previous day: No intake or output data in the 24 hours ending 11/02/11 1148   Physical Exam:  Gen:  Awake, alert in NAD, appears frail Cardiovascular:  S1S2 RRR, with SEM, no LE edema Respiratory: CTA, no increased wob Gastrointestinal: abdomen soft, distended,  Extremities: no c/c/e   Lab Results:  Lab 11/01/11 0420 10/31/11 0304 10/30/11 0500  HGB 14.0 13.6 13.7  HCT 40.1 39.4 39.6  WBC 9.4 10.9* 7.6  PLT 137* 124* 113*     Lab 11/02/11 0450 11/01/11 0420 10/31/11 0304 10/30/11 0500 10/29/11 0505 10/26/11 1503  NA 135 134* 136 138 137 --  K 4.0 3.7 -- -- -- --  CL 104 102 105 108 106 --  CO2 24 24 25 23 24  --  GLUCOSE 87 107* 147* 158* 91 --  BUN 14 10 8 7 6  --  CREATININE 0.87 0.79 0.73 0.71 0.72 --  CALCIUM 8.4 8.4 8.4 8.0* 8.2* --  MG -- -- -- -- -- 1.7  PHOS -- -- -- -- -- --    Studies/Results: Dg Chest Port 1 View  10/31/2011  *RADIOLOGY REPORT*  History:  Shortness of breath  Comparison:  10/28/2011  PORTABLE CHEST - 1 VIEW  Findings: Right-sided PICC line tip terminates over the cavoatrial junction.  Mild cardiomegaly persists.  Lung volumes are extremely low, with  curvilinear bilateral lower lobe atelectasis.  Trace left pleural effusion or atelectasis.  Right glenohumeral joint degenerative change.  Mild diffuse gaseous prominence of loops of bowel project in the upper abdomen.  IMPRESSION: Extremely low lung volumes with bilateral lower lobe atelectasis and possible trace left pleural effusion. If the patient's symptoms continue, consider PA and lateral chest radiographs obtained at full inspiration when the patient is clinically able.  Original Report Authenticated By: Harrel Lemon, M.D.    Medications: Scheduled Meds:    . bisacodyl  10 mg Rectal Daily  . fluocinonide cream  1 application Topical BID  . furosemide  20 mg Oral Daily  . isosorbide mononitrate  30 mg Oral Daily  . metoprolol tartrate  12.5 mg Oral BID  . montelukast  10 mg Oral Daily  . pantoprazole  40 mg Oral Q1200  . psyllium  1 packet Oral BID  . saccharomyces boulardii  250 mg Oral BID  . sodium chloride  10 mL Intracatheter Q12H  . spironolactone  50 mg Oral Daily   Continuous Infusions:   PRN Meds:.acetaminophen, albuterol, alum & mag hydroxide-simeth, bisacodyl, LORazepam, morphine injection, ondansetron (ZOFRAN) IV, ondansetron, sodium chloride, zolpidem Antibiotics: Anti-infectives     Start     Dose/Rate Route Frequency Ordered Stop   10/29/11 1100   ertapenem (INVANZ) 1  g in sodium chloride 0.9 % 50 mL IVPB  Status:  Discontinued        1 g 100 mL/hr over 30 Minutes Intravenous  Once 10/29/11 1047 10/29/11 1517   10/29/11 0600   cefOXitin (MEFOXIN) 1 g in dextrose 5 % 50 mL IVPB  Status:  Discontinued     Comments: Please give on call to OR      1 g 100 mL/hr over 30 Minutes Intravenous  Once 10/28/11 0824 11/01/11 1026   10/25/11 2215   metroNIDAZOLE (FLAGYL) IVPB 500 mg        500 mg 100 mL/hr over 60 Minutes Intravenous  Once 10/25/11 2213 10/26/11 0015   10/25/11 2215   ciprofloxacin (CIPRO) IVPB 400 mg        400 mg 200 mL/hr over 60 Minutes  Intravenous  Once 10/25/11 2213 10/26/11 0359         Interim summary: This is feels his 78 year old female with no prior abdominal surgery, she has history of coronary artery disease prior CVA and hypertension. Patient came into the emergency department because of increased abdominal pain, distention and abdominal cramps. Patient did have mild nausea and vomiting. Upon initial evaluation in the emergency department, patient was found to have potassium of 2.7, patient radiological findings and symptoms were consistent with small bowel obstruction. After admission patient was evaluated by CT scan of abdomen pelvis which showed the partial small bowel obstruction again, it showed also intussusception with questionable intraluminal mass in the jejunum. General surgery was consulted. Patient was observed for about 3 days. At that time patient continued to have small jelly-like bowel movements. Patient was taken to the emergency department exploratory laparotomy was done and showed no evidence of mass or bowel obstruction. Patient did have ascites and significant nodularity of the liver consistent with cirrhosis. Currently patient is recovering from her surgery, gastroenterology evaluating her for her cirrhosis. Patient will be appropriate for discharge in the morning if she tolerates diet. She needs followup with gastroenterology as outpatient.  Assessment/Plan: Partial SBO  -Intraluminal mass found on CT scan. Patient had exploratory laparotomy on 10/29/2011  -Per surgical report no obvious causes for small bowel obstruction identified, no masses.  -The liver looks very cirrhotic and there is ascites. Ascitic fluid sent to the lab for cytology. -Patient needed to go home, discussed with general surgery, full liquids today and try some time in the morning.  Hypokalemia  -Stable. Repleted   CAD  -Quiet and Stable during this admission.  -She is tachycardic, I will obtain 12-lead EKG and start patient  on low dose of beta blockers.   Cirrhosis.  -New diagnosis on CT. slightly elevated INR.  -Unclear etiology, denies alcohol use.  -I added Lasix 20 and spironolactone 50, for her tense ascites.  -Patient for paracentesis today, GI is following.  Anxiety  -Situational. Ativan started PRN   Anemia and thrombocytopenia  -Patient anemia is likely postoperative, status post transfusion of 2 units of packed RBCs.  -Mild thrombocytopenia likely secondary to her chronic liver disease.   Disposition -Patient wants to go home, not appropriate from today. -Discussed  with her and her husband, according to surgery recommendation full liquids today and try regular diet in the morning if she tolerates that she will be appropriate for discharge.   DVT Prophylaxis. lovenox  Clint Lipps Pager: [phone] 11/02/2011, 11:48 AM   LOS: 8 days    11/02/2011, 11:48 AM

## 2011-11-02 NOTE — Procedures (Signed)
Procedure : diagnostic and therapeutic paracentesis Specimen : 2.5 L bloody serous fluid Complications : none immediate  Fluid sent to the lab for analysis as requested.  Patient tolerated well with no immediate complications.  See full report in radiology report.

## 2011-11-02 NOTE — Progress Notes (Signed)
   CARE MANAGEMENT NOTE 11/02/2011  Patient:  [name], [name]   Account Number:  [id number]  Date Initiated:  10/26/2011  Documentation initiated by:  Letha Cape  Subjective/Objective Assessment:   dx sbo  admit- lives with spouse.     Action/Plan:   pt eval- no pt f/u  11/02/11- PT/OT eval post op- ?HH-PT/OT   Anticipated DC Date:  10/27/2011   Anticipated DC Plan:  HOME/SELF CARE      DC Planning Services  CM consult      Choice offered to / List presented to:             Status of service:  In process, will continue to follow Medicare Important Message given?   (If response is "NO", the following Medicare IM given date [place] will be blank) Date Medicare IM given:   Date Additional Medicare IM given:    Discharge Disposition:    Per UR Regulation:    If discussed at Long Length of Stay Meetings, dates discussed:    Comments:  11/02/11- 1030- Donn Pierini RN, BSN [phone] Pt s/p exp lap- PT/OT working with pt- may need HH- pending progress. CM to continue to follow for d/c planning needs.  10/29/11- 1045- Donn Pierini RN, BSN [phone] Pt for OR today for exp. lap with small bowel resection, will f/u post operatively for potential d/c needs.  10/26/11 16:42 Letha Cape RN, BSN [phone] patient lives with spouse, per physical therapy pt has not pt needs.  NCM will continue to follow for dc needs. Patient has medication coverage and transportation.

## 2011-11-02 NOTE — Progress Notes (Signed)
Patient ID: [name], female   DOB: Nov 30, 1928, 78 y.o.   MRN: [id number] 4 Days Post-Op  Subjective: Pt is upset.  She keeps telling all the providers that "they told me I could go home today", however no one has told her that.  She states that she is going home today.  I informed her from a surgical standpoint and a medical standpoint, she is not ready to go home today.  She is tolerating clears now, no nausea or vomiting.  +BM  Objective: Vital signs in last 24 hours: Temp:  [98 F (36.7 C)-98.4 F (36.9 C)] 98.4 F (36.9 C) (04/22 2100) Pulse Rate:  [94-111] 97  (04/22 2300) Resp:  [20] 20  (04/22 2100) BP: (135-141)/(54-86) 137/68 mmHg (04/22 2300) SpO2:  [93 %-95 %] 94 % (04/22 2100) Last BM Date: 11/02/11  Intake/Output from previous day: 04/22 0701 - 04/23 0700 In: -  Out: 150 [Urine:150] Intake/Output this shift:    PE: Abd: soft, still with some distention, some BS, appropriately tender, incision c/d/i with staples.  Lab Results:   Basename 11/01/11 0420 10/31/11 0304  WBC 9.4 10.9*  HGB 14.0 13.6  HCT 40.1 39.4  PLT 137* 124*   BMET  Basename 11/02/11 0450 11/01/11 0420  NA 135 134*  K 4.0 3.7  CL 104 102  CO2 24 24  GLUCOSE 87 107*  BUN 14 10  CREATININE 0.87 0.79  CALCIUM 8.4 8.4   PT/INR  Basename 10/31/11 0304  LABPROT 19.1*  INR 1.57*   CMP     Component Value Date/Time   NA 135 11/02/2011 0450   K 4.0 11/02/2011 0450   CL 104 11/02/2011 0450   CO2 24 11/02/2011 0450   GLUCOSE 87 11/02/2011 0450   BUN 14 11/02/2011 0450   CREATININE 0.87 11/02/2011 0450   CALCIUM 8.4 11/02/2011 0450   PROT 5.4* 11/01/2011 0420   ALBUMIN 2.0* 11/01/2011 0420   AST 20 11/01/2011 0420   ALT 10 11/01/2011 0420   ALKPHOS 58 11/01/2011 0420   BILITOT 2.3* 11/01/2011 0420   GFRNONAA 61* 11/02/2011 0450   GFRAA 71* 11/02/2011 0450   Lipase  No results found for this basename: lipase       Studies/Results: Dg Chest Port 1 View  10/31/2011  *RADIOLOGY  REPORT*  History:  Shortness of breath  Comparison:  10/28/2011  PORTABLE CHEST - 1 VIEW  Findings: Right-sided PICC line tip terminates over the cavoatrial junction.  Mild cardiomegaly persists.  Lung volumes are extremely low, with curvilinear bilateral lower lobe atelectasis.  Trace left pleural effusion or atelectasis.  Right glenohumeral joint degenerative change.  Mild diffuse gaseous prominence of loops of bowel project in the upper abdomen.  IMPRESSION: Extremely low lung volumes with bilateral lower lobe atelectasis and possible trace left pleural effusion. If the patient's symptoms continue, consider PA and lateral chest radiographs obtained at full inspiration when the patient is clinically able.  Original Report Authenticated By: Harrel Lemon, M.D.    Anti-infectives: Anti-infectives     Start     Dose/Rate Route Frequency Ordered Stop   10/29/11 1100   ertapenem (INVANZ) 1 g in sodium chloride 0.9 % 50 mL IVPB  Status:  Discontinued        1 g 100 mL/hr over 30 Minutes Intravenous  Once 10/29/11 1047 10/29/11 1517   10/29/11 0600   cefOXitin (MEFOXIN) 1 g in dextrose 5 % 50 mL IVPB  Status:  Discontinued  Comments: Please give on call to OR      1 g 100 mL/hr over 30 Minutes Intravenous  Once 10/28/11 0824 11/01/11 1026   10/25/11 2215   metroNIDAZOLE (FLAGYL) IVPB 500 mg        500 mg 100 mL/hr over 60 Minutes Intravenous  Once 10/25/11 2213 10/26/11 0015   10/25/11 2215   ciprofloxacin (CIPRO) IVPB 400 mg        400 mg 200 mL/hr over 60 Minutes Intravenous  Once 10/25/11 2213 10/26/11 0359           Assessment/Plan  1. S/p ex lap that was negative for small bowel mass or cause for obstruction 2. New dx of cirrhosis  Plan: 1. Will advance to a full liquid diet.  If patient tolerates this, then could likely have a regular diet tomorrow.  I d/w Dr. Arthor Captain.   2. The patient is eager to go home, but still needs at least another day from a surgical and appears a  medical standpoint.  Will continue to follow.   LOS: 8 days    Ricki Vanhandel E 11/02/2011

## 2011-11-02 NOTE — Progress Notes (Signed)
     [facility] Daily Rounding Note 11/02/2011, 8:30 AM  SUBJECTIVE:       "I'm going home today"  ????.  Denies SOB, nausea, says she had 2 soft BMs yesterday.  Belly does not hurt or feel tight.  Asked for help in dialing her husband but could not remember her home phone #.  OBJECTIVE:        General: Seems confused.    Looks frail and chronically unwell.    Vital signs in last 24 hours:    Temp:  [98 F (36.7 C)-98.4 F (36.9 C)] 98.4 F (36.9 C) (04/22 2100) Pulse Rate:  [94-111] 97  (04/22 2300) Resp:  [20] 20  (04/22 2100) BP: (133-141)/(54-86) 137/68 mmHg (04/22 2300) SpO2:  [93 %-95 %] 94 % (04/22 2100) Last BM Date: 11/02/11  Heart: RRR Chest: greatly diminished BS globally, kyphotic spine.  No adventitious sounds, no cough.  Abdomen: seems less soft, tighter.  BS present but hypoactive.  NT.    Extremities: no pedal edema Neuro/Psych:  Confused but oriented to place and self and is appropriate. , a bit tremulous.  Not anxious or agitated   Intake/Output from previous day: 04/22 0701 - 04/23 0700 In: -  Out: 150 [Urine:150]  Intake/Output this shift:    Lab Results:  Basename 11/01/11 0420 10/31/11 0304  WBC 9.4 10.9*  HGB 14.0 13.6  HCT 40.1 39.4  PLT 137* 124*   BMET  Basename 11/02/11 0450 11/01/11 0420 10/31/11 0304  NA 135 134* 136  K 4.0 3.7 3.5  CL 104 102 105  CO2 24 24 25   GLUCOSE 87 107* 147*  BUN 14 10 8   CREATININE 0.87 0.79 0.73  CALCIUM 8.4 8.4 8.4   LFT  Basename 11/01/11 0420  PROT 5.4*  ALBUMIN 2.0*  AST 20  ALT 10  ALKPHOS 58  BILITOT 2.3*  BILIDIR --  IBILI --   PT/INR  Basename 10/31/11 0304  LABPROT 19.1*  INR 1.57*   Hepatitis Panel No results found for this basename: HEPBSAG,HCVAB,HEPAIGM,HEPBIGM in the last 72 hours   ASSESMENT:  1. New diagnosis of cirrhosis in frail-appearing, elderly female. No significant etoh hx, no prior hx hepatitis that I can find. Ferritin not elevated which rules out  hemochromatosis. Suspect this will turn out to be cryptogenic. AMA, ANA, viral hepatitis serologies are pending.  2. CAD. Abnormal stress test 2011. 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 Pro BNP is 68.4, so does not have significant CHF so likely no cardiac etiology to the cirrhosis.  3. Coagulopathy by labs.  No overt bleeding problems.   4. Hx bleeding duodenal polyp and associated transfusion requiring anemia 09/2010. No evidence of portal htn, varices etc then. FOB negative on 10/28/11 5. S/p 10/29/11 ex lap. Failed to demonstrate SBO or intraluminal mass.     PLAN: 1.  I ordered u/s paracentesis with cell count/diff, LDH, Protein.  Cytology is "cooking" from 4/22 surgical aspirate.  Hopefully can get this completed today.  Does not need to be NPO for this. 2.  In reviewing notes, do not see any that mention her leaving hospital today.  3.  Daily weights. 4.  Consider addition of low dose spironolactone and possibly Lasix, but need to be careful as her pressures tend to be low side of normal already and she is petite and frail.     LOS: 8 days   Jennye Moccasin  11/02/2011, 8:30 AM Pager: [phone]

## 2011-11-02 NOTE — Progress Notes (Addendum)
Gradually adv diet.  OK to D/C home when tolerating PO better in 1-2 days, BUT poor mobility & liver dysfx slowing her recovery Bowel regimen as tolerated Agree w GI involvement for hepatology management

## 2011-11-02 NOTE — Consult Note (Signed)
Reviewed and agree with management. Ogle Hoeffner D. Romanda Turrubiates, M.D., FACG  

## 2011-11-03 DIAGNOSIS — K56600 Partial intestinal obstruction, unspecified as to cause: Secondary | ICD-10-CM | POA: Diagnosis present

## 2011-11-03 LAB — ANA: Anti Nuclear Antibody(ANA): NEGATIVE

## 2011-11-03 LAB — HEPATITIS PANEL, ACUTE
HCV Ab: NEGATIVE
Hep A IgM: NEGATIVE
Hepatitis B Surface Ag: NEGATIVE

## 2011-11-03 LAB — PATHOLOGIST SMEAR REVIEW: Tech Review: REACTIVE

## 2011-11-03 MED ORDER — METOPROLOL TARTRATE 12.5 MG HALF TABLET: 12.5000 mg | ORAL_TABLET | Freq: Two times a day (BID) | ORAL | Status: DC

## 2011-11-03 MED ORDER — SPIRONOLACTONE 50 MG PO TABS: 50.0000 mg | ORAL_TABLET | Freq: Every day | ORAL | Status: DC

## 2011-11-03 MED ORDER — ISOSORBIDE MONONITRATE ER 30 MG PO TB24: 30.0000 mg | ORAL_TABLET | Freq: Every day | ORAL | Status: DC

## 2011-11-03 MED ORDER — FUROSEMIDE 20 MG PO TABS: 20.0000 mg | ORAL_TABLET | Freq: Every day | ORAL | Status: DC

## 2011-11-03 NOTE — Progress Notes (Signed)
[facility] Daily Rounding Note 11/03/2011, 8:29 AM  SUBJECTIVE:       Wants to go home.  Tolerating fulls.  C/o not sleeping well and feeling anxious because she is in hospital.  No pain.  No n/v. BM yesterday.    OBJECTIVE:        General:  Frail, chronically -ill looking    Vital signs in last 24 hours:    Temp:  [97.4 F (36.3 C)-98.7 F (37.1 C)] 97.8 F (36.6 C) (04/24 0500) Pulse Rate:  [84-98] 88  (04/24 0500) Resp:  [18-20] 18  (04/24 0500) BP: (96-137)/(47-70) 137/68 mmHg (04/24 0500) SpO2:  [91 %-95 %] 91 % (04/24 0500) Weight:  140 lb 11.2 oz was 130 # on 10/29/11 Last BM Date: 11/02/11  Heart: RRR Chest: Some ronchi at bases.  No cough or SOB Abdomen: soft but protuberant.  Scar staples intact  Extremities: no pedal edema.  Multiple purpura on arms. Neuro/Psych:  Pleasant.  Fully oriented.  Moves all 4s.  Intake/Output from previous day: 04/23 0701 - 04/24 0700 In: 340 [P.O.:120] Out: 975 [Urine:975]  Intake/Output this shift:    Lab Results:  Basename 11/01/11 0420  WBC 9.4  HGB 14.0  HCT 40.1  PLT 137*   BMET  Basename 11/02/11 0450 11/01/11 0420  NA 135 134*  K 4.0 3.7  CL 104 102  CO2 24 24  GLUCOSE 87 107*  BUN 14 10  CREATININE 0.87 0.79  CALCIUM 8.4 8.4   LFT  Basename 11/01/11 0420  PROT 5.4*  ALBUMIN 2.0*  AST 20  ALT 10  ALKPHOS 58  BILITOT 2.3*  BILIDIR --  IBILI --    Ascites: WBCs   327 Protein  0.7 LDH 76 Total protein 0.7  ANA, AMA, Acute hepatitis serologies are still pending  Cytopathology on ascites: PERITONEAL FLUID: ATYPICAL CELLS PRESENT SEE COMMENT THE ATYPICAL CELLS ARE FAVORED TO REPRESENT REACTIVE MESOTHELIAL CELLS PRESENT. A LIMITED IMMUNOSTAIN PANEL DEMONSTRATES NON-SPECIFIC FINDINGS. THE CASE WAS REVIEWED WITH DR Luisa Hart, WHO CONCURS.  Studies/Results: Dg Chest 2 View  11/02/2011  *RADIOLOGY REPORT*  Clinical Data: Wheezing.  Left lower chest pain.  CHEST - 2 VIEW  Comparison: 10/31/2011   Findings: There is a right arm PICC line with tip in the cavoatrial junction.  Small bilateral pleural effusions are again noted and appear unchanged from previous exam.  Atelectasis is noted within the left midlung and left base.  No new findings identified.  IMPRESSION:  1.  Low lung volumes and left base atelectasis. 2.  Stable bilateral pleural effusions.  Original Report Authenticated By: Rosealee Albee, M.D.   US Paracentesis  11/02/2011  *RADIOLOGY REPORT*  Clinical Data: Abdominal ascites.  ULTRASOUND GUIDED PARACENTESIS  Comparison:  None  An ultrasound guided paracentesis was thoroughly discussed with the patient and questions answered.  The benefits, risks, alternatives and complications were also discussed.  The patient understands and wishes to proceed with the procedure.  Written consent was obtained.  Ultrasound was performed to localize and mark an adequate pocket of fluid in the right lower quadrant of the abdomen.  The area was then prepped and draped in the normal sterile fashion.  1% Lidocaine was used for local anesthesia.  Under ultrasound guidance a 19 gauge Yueh catheter was introduced.  Paracentesis was performed.  The catheter was removed and a dressing applied.  Complications:  None immediate  Findings:  A total of approximately 2.5 liters of bloody serous fluid  was removed.  A fluid sample was sent for laboratory analysis.  IMPRESSION: Successful ultrasound guided paracentesis yielding 2.5 liters of ascites.  Read by: Anselm Pancoast, P.A.-C  Original Report Authenticated By: Richarda Overlie, M.D.    ASSESMENT: 1. New diagnosis of cirrhosis with ascites. No significant etoh hx, no prior hx hepatitis. Ferritin not elevated, ruling out hemochromatosis. Ascitic cytology with reactive mesothelial cells. Suspect cirrhosis will turn out to be cryptogenic.  Note weight gain of 5 # in 5 days and this current weight is post paracentesis of 2.5 liters.  AMA, ANA, viral hepatitis serologies  are pending.  2. CAD. Abnormal stress test 2011. 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 Pro BNP is 68.4, so does not have significant CHF so likely no cardiac etiology to the cirrhosis.  3. Coagulopathy by labs. No overt bleeding problems.  4. Hx bleeding duodenal polyp and associated transfusion requiring anemia 09/2010. No evidence of portal htn, varices etc then. FOB negative on 10/28/11  5. S/p 10/29/11 ex lap. Failed to demonstrate SBO or intraluminal mass.    PLAN: 1.  Pt discharging today.  Has ROV with dr Arlyce Dice on 11/17/11.   Keep on Lasix 20 mg, Aldactone 50 mg daily.  Was on Demedex PTA,  Which she should d/c given these 2 , new diuretics.  BMET next week.  Daily weights.  Husband should record these.    LOS: 9 days   Jennye Moccasin  11/03/2011, 8:29 AM Pager: [phone]

## 2011-11-03 NOTE — Progress Notes (Signed)
Cardiology Progress Note Patient Name: [name] Date of Encounter: 11/03/2011, 9:21 AM     Subjective  No overnight events. Patient w/o c/o chest pain, sob, or abd pain. Ready to go home.   Objective   Telemetry: Was not on telemetry when I saw the patient.  Medications: . bisacodyl  10 mg Rectal Daily  . fluocinonide cream  1 application Topical BID  . furosemide  20 mg Oral Daily  . isosorbide mononitrate  30 mg Oral Daily  . metoprolol tartrate  12.5 mg Oral BID  . montelukast  10 mg Oral Daily  . pantoprazole  40 mg Oral Q1200  . psyllium  1 packet Oral BID  . saccharomyces boulardii  250 mg Oral BID  . sodium chloride  10 mL Intracatheter Q12H  . spironolactone  50 mg Oral Daily   Physical Exam: Temp:  [97.4 F (36.3 C)-98.7 F (37.1 C)] 97.8 F (36.6 C) (04/24 0500) Pulse Rate:  [84-98] 88  (04/24 0500) Resp:  [18-20] 18  (04/24 0500) BP: (96-137)/(47-70) 137/68 mmHg (04/24 0500) SpO2:  [91 %-95 %] 91 % (04/24 0500) Weight:  [140 lb 11.2 oz (63.821 kg)] 140 lb 11.2 oz (63.821 kg) (04/24 0500)  General: Frail appearing elderly WF in no acute distress.  Head: Normocephalic, atraumatic, sclera non-icteric, no xanthomas, nares are without discharge.  Neck: No obvious lymphadenopathy. JVD not elevated.  Lungs: Decreased BS in bases, scattered exp wheezes. Unlabored breathing.  Heart: RRR S1 S2 without murmurs, rubs, or gallops.  Abdomen: Soft, round and distended with normoactive bowel sounds. No rebound/guarding. No obvious abdominal masses.  Msk: Strength is normal for age, generalized muscle atrophy noted.  Extremities: No clubbing or cyanosis. No edema. Distal pedal pulses are 2+ and equal bilaterally. Scattered ecchymosis UE's noted.  Neuro: Alert and oriented X 3. Hard of hearing. Moves all extremities spontaneously.  Psych: Responds to questions appropriately with a normal affect.    Intake/Output Summary (Last 24 hours) at 11/03/11 0921 Last  data filed at 11/03/11 0500  Gross per 24 hour  Intake    340 ml  Output    975 ml  Net   -635 ml    Labs:  [facility] 11/02/11 0450 11/01/11 0420  NA 135 134*  K 4.0 3.7  CL 104 102  CO2 24 24  GLUCOSE 87 107*  BUN 14 10  CREATININE 0.87 0.79  CALCIUM 8.4 8.4   Basename 11/01/11 0420  AST 20  ALT 10  ALKPHOS 58  BILITOT 2.3*  PROT 5.4*  ALBUMIN 2.0*   Basename 11/01/11 0420  WBC 9.4  HGB 14.0  HCT 40.1  MCV 85.7  PLT 137*   Radiology/Studies:   11/02/2011 - CXR  Findings: There is a right arm PICC line with tip in the cavoatrial junction.  Small bilateral pleural effusions are again noted and appear unchanged from previous exam.  Atelectasis is noted within the left midlung and left base.  No new findings identified.  IMPRESSION:  1.  Low lung volumes and left base atelectasis. 2.  Stable bilateral pleural effusions.    Assessment and Plan   1. POD#5 of exploratory laparotomy - initially done because of possible mass seen on CT but op note reports no mass or SBO found, but liver appeared cirrhotic which is new dx for pt. Underwent diagnostic and therapeutic paracentesis yesterday, fluid shows polymorphic nuclear cells less than 500. Was started on Lasix and spironolactone.  -  workup per IM/GI.   2. Cirrhosis with coagulopathy - as above.   3. H/o CAD s/p PCI 1999 with pericath CVA, abnl stress test 2011 but patient refused cath/ASA/statins - not on Plavix due to hx of GIB. BB started yesterday for tachycardia. Her CAD has been quiescent this admission. Abdominal bloating felt due to cirrhosis at this time. Continue nitrates, BB. No new recs.   4. HTN - stable.  Disposition: Home today per primary team.  Signed, Zikeria Keough PA-C

## 2011-11-03 NOTE — Progress Notes (Signed)
Patient's O2 sats on room air 88%.  Patient offered oxygen therapy at home and refused.  Will continue to monitor until discharge. Nolon Nations

## 2011-11-03 NOTE — Progress Notes (Signed)
DC orders received.  Patient stable with no S/S of distress.  Discharge and medication instructions reviewed with patient and husband.  Patient DC home with husband. [name]

## 2011-11-03 NOTE — Progress Notes (Signed)
Patient ID: [name], female   DOB: 1929/07/09, 78 y.o.   MRN: [id number] 5 Days Post-Op  Subjective: Pt apparently is telling us that she has had a BM, however, the nursing staff states she has yet to have a BM.  She is eating less than 25% of her meals.  She didn't eat any breakfast today.  She says she has no nausea.  Objective: Vital signs in last 24 hours: Temp:  [97.4 F (36.3 C)-98.7 F (37.1 C)] 97.8 F (36.6 C) (04/24 0500) Pulse Rate:  [84-93] 88  (04/24 0500) Resp:  [18-20] 18  (04/24 0500) BP: (96-137)/(47-70) 137/68 mmHg (04/24 0500) SpO2:  [91 %-95 %] 91 % (04/24 0500) Weight:  [140 lb 11.2 oz (63.821 kg)] 140 lb 11.2 oz (63.821 kg) (04/24 0500) Last BM Date: 11/02/11  Intake/Output from previous day: 04/23 0701 - 04/24 0700 In: 340 [P.O.:120] Out: 975 [Urine:975] Intake/Output this shift:    PE: Abd: soft, less distended after paracentesis, +BS, mildly tender, incision c/d/i with staples  Lab Results:   Basename 11/01/11 0420  WBC 9.4  HGB 14.0  HCT 40.1  PLT 137*   BMET  Basename 11/02/11 0450 11/01/11 0420  NA 135 134*  K 4.0 3.7  CL 104 102  CO2 24 24  GLUCOSE 87 107*  BUN 14 10  CREATININE 0.87 0.79  CALCIUM 8.4 8.4   PT/INR No results found for this basename: LABPROT:2,INR:2 in the last 72 hours CMP     Component Value Date/Time   NA 135 11/02/2011 0450   K 4.0 11/02/2011 0450   CL 104 11/02/2011 0450   CO2 24 11/02/2011 0450   GLUCOSE 87 11/02/2011 0450   BUN 14 11/02/2011 0450   CREATININE 0.87 11/02/2011 0450   CALCIUM 8.4 11/02/2011 0450   PROT 5.4* 11/01/2011 0420   ALBUMIN 2.0* 11/01/2011 0420   AST 20 11/01/2011 0420   ALT 10 11/01/2011 0420   ALKPHOS 58 11/01/2011 0420   BILITOT 2.3* 11/01/2011 0420   GFRNONAA 61* 11/02/2011 0450   GFRAA 71* 11/02/2011 0450   Lipase  No results found for this basename: lipase       Studies/Results: Dg Chest 2 View  11/02/2011  *RADIOLOGY REPORT*  Clinical Data: Wheezing.  Left lower  chest pain.  CHEST - 2 VIEW  Comparison: 10/31/2011  Findings: There is a right arm PICC line with tip in the cavoatrial junction.  Small bilateral pleural effusions are again noted and appear unchanged from previous exam.  Atelectasis is noted within the left midlung and left base.  No new findings identified.  IMPRESSION:  1.  Low lung volumes and left base atelectasis. 2.  Stable bilateral pleural effusions.  Original Report Authenticated By: Rosealee Albee, M.D.   US Paracentesis  11/02/2011  *RADIOLOGY REPORT*  Clinical Data: Abdominal ascites.  ULTRASOUND GUIDED PARACENTESIS  Comparison:  None  An ultrasound guided paracentesis was thoroughly discussed with the patient and questions answered.  The benefits, risks, alternatives and complications were also discussed.  The patient understands and wishes to proceed with the procedure.  Written consent was obtained.  Ultrasound was performed to localize and mark an adequate pocket of fluid in the right lower quadrant of the abdomen.  The area was then prepped and draped in the normal sterile fashion.  1% Lidocaine was used for local anesthesia.  Under ultrasound guidance a 19 gauge Yueh catheter was introduced.  Paracentesis was performed.  The catheter was removed and a  dressing applied.  Complications:  None immediate  Findings:  A total of approximately 2.5 liters of bloody serous fluid was removed.  A fluid sample was sent for laboratory analysis.  IMPRESSION: Successful ultrasound guided paracentesis yielding 2.5 liters of ascites.  Read by: Anselm Pancoast, P.A.-C  Original Report Authenticated By: Richarda Overlie, M.D.    Anti-infectives: Anti-infectives     Start     Dose/Rate Route Frequency Ordered Stop   10/29/11 1100   ertapenem (INVANZ) 1 g in sodium chloride 0.9 % 50 mL IVPB  Status:  Discontinued        1 g 100 mL/hr over 30 Minutes Intravenous  Once 10/29/11 1047 10/29/11 1517   10/29/11 0600   cefOXitin (MEFOXIN) 1 g in dextrose 5 % 50  mL IVPB  Status:  Discontinued     Comments: Please give on call to OR      1 g 100 mL/hr over 30 Minutes Intravenous  Once 10/28/11 0824 11/01/11 1026   10/25/11 2215   metroNIDAZOLE (FLAGYL) IVPB 500 mg        500 mg 100 mL/hr over 60 Minutes Intravenous  Once 10/25/11 2213 10/26/11 0015   10/25/11 2215   ciprofloxacin (CIPRO) IVPB 400 mg        400 mg 200 mL/hr over 60 Minutes Intravenous  Once 10/25/11 2213 10/26/11 0359           Assessment/Plan  1. S/p ex lap 2. Post op ileus, resolving 3. New dx of cirrhosis 4. CAD  Plan: 1. Advance to a regular diet.  Patient really wants creamed potatoes.  It would be nice for the patient to be able to eat a little more and prove to [place] she can tolerate a diet and her bowels are working prior to discharge. 2. I have d/w Dr. Robb Matar and we both feel as if the patient is not stable yet for discharge home today.  we will continue to follow.   LOS: 9 days    Mahala Rommel E 11/03/2011

## 2011-11-03 NOTE — Progress Notes (Addendum)
Subjective: Patient currently has no complaints, has been tolerated her diet well, patient relates she did have a bowel movement this morning, as talking to the nurse the nurse as she has not had any bowel movement today. She wants to go home today.  Objective: Filed Vitals:   11/02/11 1443 11/02/11 2100 11/02/11 2254 11/03/11 0500  BP: 107/47 111/63  137/68  Pulse:  91 93 88  Temp:  98.7 F (37.1 C)  97.8 F (36.6 C)  TempSrc:  Oral  Oral  Resp:  18  18  Height:      Weight:    63.821 kg (140 lb 11.2 oz)  SpO2:  92%  91%   Weight change:   Intake/Output Summary (Last 24 hours) at 11/03/11 0905 Last data filed at 11/03/11 0500  Gross per 24 hour  Intake    340 ml  Output    975 ml  Net   -635 ml    General: Alert, awake, oriented x3, in no acute distress.  HEENT: No bruits, no goiter.  Heart: Regular rate and rhythm, without murmurs, rubs, gallops.  Lungs: Crackles left side, bilateral air movement.  Abdomen: Soft, nontender, nondistended, positive bowel sounds. Scar looks clean Neuro: Grossly intact, nonfocal.   Lab Results:  Basename 11/02/11 0450 11/01/11 0420  NA 135 134*  K 4.0 3.7  CL 104 102  CO2 24 24  GLUCOSE 87 107*  BUN 14 10  CREATININE 0.87 0.79  CALCIUM 8.4 8.4  MG -- --  PHOS -- --    Basename 11/01/11 0420  AST 20  ALT 10  ALKPHOS 58  BILITOT 2.3*  PROT 5.4*  ALBUMIN 2.0*   No results found for this basename: LIPASE:2,AMYLASE:2 in the last 72 hours  Basename 11/01/11 0420  WBC 9.4  NEUTROABS --  HGB 14.0  HCT 40.1  MCV 85.7  PLT 137*   No results found for this basename: CKTOTAL:3,CKMB:3,CKMBINDEX:3,TROPONINI:3 in the last 72 hours No components found with this basename: POCBNP:3 No results found for this basename: DDIMER:2 in the last 72 hours No results found for this basename: HGBA1C:2 in the last 72 hours No results found for this basename: CHOL:2,HDL:2,LDLCALC:2,TRIG:2,CHOLHDL:2,LDLDIRECT:2 in the last 72 hours No results  found for this basename: TSH,T4TOTAL,FREET3,T3FREE,THYROIDAB in the last 72 hours No results found for this basename: VITAMINB12:2,FOLATE:2,FERRITIN:2,TIBC:2,IRON:2,RETICCTPCT:2 in the last 72 hours  Micro Results: Recent Results (from the past 240 hour(s))  SURGICAL PCR SCREEN     Status: Normal   Collection Time   10/29/11 12:12 AM      Component Value Range Status Comment   MRSA, PCR NEGATIVE  NEGATIVE  Final    Staphylococcus aureus NEGATIVE  NEGATIVE  Final   MRSA PCR SCREENING     Status: Normal   Collection Time   10/29/11  3:30 PM      Component Value Range Status Comment   MRSA by PCR NEGATIVE  NEGATIVE  Final     Studies/Results: Dg Chest 2 View  11/02/2011  *RADIOLOGY REPORT*  Clinical Data: Wheezing.  Left lower chest pain.  CHEST - 2 VIEW  Comparison: 10/31/2011  Findings: There is a right arm PICC line with tip in the cavoatrial junction.  Small bilateral pleural effusions are again noted and appear unchanged from previous exam.  Atelectasis is noted within the left midlung and left base.  No new findings identified.  IMPRESSION:  1.  Low lung volumes and left base atelectasis. 2.  Stable bilateral pleural effusions.  Original  Report Authenticated By: Rosealee Albee, M.D.   US Paracentesis  11/02/2011  *RADIOLOGY REPORT*  Clinical Data: Abdominal ascites.  ULTRASOUND GUIDED PARACENTESIS  Comparison:  None  An ultrasound guided paracentesis was thoroughly discussed with the patient and questions answered.  The benefits, risks, alternatives and complications were also discussed.  The patient understands and wishes to proceed with the procedure.  Written consent was obtained.  Ultrasound was performed to localize and mark an adequate pocket of fluid in the right lower quadrant of the abdomen.  The area was then prepped and draped in the normal sterile fashion.  1% Lidocaine was used for local anesthesia.  Under ultrasound guidance a 19 gauge Yueh catheter was introduced.   Paracentesis was performed.  The catheter was removed and a dressing applied.  Complications:  None immediate  Findings:  A total of approximately 2.5 liters of bloody serous fluid was removed.  A fluid sample was sent for laboratory analysis.  IMPRESSION: Successful ultrasound guided paracentesis yielding 2.5 liters of ascites.  Read by: Anselm Pancoast, P.A.-C  Original Report Authenticated By: Richarda Overlie, M.D.    Medications: I have reviewed the patient's current medications.  Assessment and plan: Principal Problem:  *Cirrhosis of liver/Ascites: -New diagnosis on CT. slightly elevated INR.  -Unclear etiology, denies alcohol use.  -Lasix 20 and spironolactone 50, for her tense ascites. Her blood pressures currently stable, her creatinine is stable and her potassium is within normal limits. -Patient for paracentesis shows polymorphic nuclear cells less than 500  Partial small bowel obstruction -Intraluminal mass found on CT scan. Patient had exploratory laparotomy on 10/29/2011 Can be DC when she has a bowel movement. Patient has not eaten food today.  Hypokalemia  -Stable. Repleted   Anemia and thrombocytopenia  -Patient anemia is likely postoperative, status post transfusion of 2 units of packed RBCs.  -Mild thrombocytopenia likely secondary to her chronic liver disease.    CAD (coronary artery disease): -Quiet and Stable during this admission.  -She is tachycardic, I will obtain 12-lead EKG and start patient on low dose of beta blockers     Disposition: Okay to be DC'd today.    LOS: 9 days   Marinda Elk M.D. Pager: [phone] [facility] 11/03/2011, 9:05 AM

## 2011-11-03 NOTE — Progress Notes (Signed)
    I have seen and examined the patient. I agree with the above note. No active cardiac issues at this time. Will sign off. Patient is likely being discharged today.   Lorine Bears MD, [facility] 11/03/2011 10:19 AM

## 2011-11-03 NOTE — Discharge Summary (Signed)
Admit date: 10/25/2011 Discharge date: 11/03/2011  Primary Care Physician:  [name], MD, MD   Discharge Diagnoses:   Active Hospital Problems  Diagnoses Date Noted   . Cirrhosis of liver 11/01/2011   . Hepatic insufficiency 11/01/2011   . CAD (coronary artery disease) 11/12/2010   . HYPERLIPIDEMIA-MIXED 11/06/2009     Resolved Hospital Problems  Diagnoses Date Noted Date Resolved  . Partial small bowel obstruction 11/03/2011 11/03/2011  . Ileus 11/01/2011 11/03/2011  . Ascites 10/27/2011 11/03/2011  . Edema 11/04/2009 11/03/2011     DISCHARGE MEDICATION: Medication List  As of 11/03/2011  9:27 AM   STOP taking these medications         potassium chloride 10 MEQ CR tablet      torsemide 20 MG tablet         TAKE these medications         fluocinonide cream 0.05 %   Commonly known as: LIDEX   Apply 1 application topically 2 (two) times daily. To affected area      furosemide 20 MG tablet   Commonly known as: LASIX   Take 1 tablet (20 mg total) by mouth daily.      isosorbide mononitrate 30 MG 24 hr tablet   Commonly known as: IMDUR   Take 1 tablet (30 mg total) by mouth daily.      metoprolol tartrate 12.5 mg Tabs   Commonly known as: LOPRESSOR   Take 0.5 tablets (12.5 mg total) by mouth 2 (two) times daily.      montelukast 10 MG tablet   Commonly known as: SINGULAIR   Take 10 mg by mouth at bedtime.      nitroGLYCERIN 0.4 MG SL tablet   Commonly known as: NITROSTAT   Place 0.4 mg under the tongue every 5 (five) minutes as needed. Chest pain      omeprazole 20 MG capsule   Commonly known as: PRILOSEC   Take 20 mg by mouth daily.      spironolactone 50 MG tablet   Commonly known as: ALDACTONE   Take 1 tablet (50 mg total) by mouth daily.              Consults: Treatment Team:  Md Montez Morita, MD [facility], MD Louis Meckel, MD   SIGNIFICANT DIAGNOSTIC STUDIES:  Dg Chest 2 View  11/02/2011  *RADIOLOGY REPORT*  Clinical Data:  Wheezing.  Left lower chest pain.  CHEST - 2 VIEW  Comparison: 10/31/2011  Findings: There is a right arm PICC line with tip in the cavoatrial junction.  Small bilateral pleural effusions are again noted and appear unchanged from previous exam.  Atelectasis is noted within the left midlung and left base.  No new findings identified.  IMPRESSION:  1.  Low lung volumes and left base atelectasis. 2.  Stable bilateral pleural effusions.  Original Report Authenticated By: Rosealee Albee, M.D.   Ct Abdomen Pelvis W Contrast  10/26/2011  *RADIOLOGY REPORT*  Clinical Data: Abdominal distention and pain  CT ABDOMEN AND PELVIS WITH CONTRAST  Technique:  Multidetector CT imaging of the abdomen and pelvis was performed following the standard protocol during bolus administration of intravenous contrast.  Contrast: 80mL OMNIPAQUE IOHEXOL 300 MG/ML  SOLN  Comparison: Radiograph 10/25/2011  Findings: Curvilinear scarring or atelectasis at the lung bases.  Streak artifact from right hip prosthesis is noted.  Moderate volume of low density ascites.  Dependent hyperdensity within the gallbladder may indicate sludge or previously administered contrast, potentially at  an outside institution.  The liver is small in size with a mildly nodular contour.  Spleen is inhomogeneous with suggestion of sub centimeter hypodense lesions, for example image 16, incompletely characterized.  Adrenal glands, pancreas, and kidneys are normal.  Moderate sized hiatal hernia noted.  Small right fluid containing Bochdalek hernia.  No evidence of small bowel malrotation.  Gradual progression to mildly dilated small bowel loop caliber is noted. On images 29 and 30, there is apparent enteroenteric intussusception or possibly intraluminal mass at the proximal jejunum.  Contrast passes distal to this level, although multiple small bowel loops remain dilated within the left upper quadrant, with distal passage of contrast into decompressed ileum.  Colon is  decompressed and unremarkable.  Scattered colonic diverticuli noted without evidence for diverticulitis.  Bladder is normal.  Right ovary is normal.  Left ovary and uterus presumed surgically absent.  No acute osseous finding.  IMPRESSION: Proximal partial small bowel obstruction to the level of small bowel (jejunum) intussusception and possibly intraluminal mass.  Moderate ascites.  Nodular contour and small liver size, which may be seen with cirrhosis in the appropriate context.  Findings called to the nurse practitioner Maren Reamer by Dr. Chilton Si on 10/26/2011 at 8:55 p.m.  Original Report Authenticated By: Harrel Lemon, M.D.   US Paracentesis  11/02/2011  *RADIOLOGY REPORT*  Clinical Data: Abdominal ascites.  ULTRASOUND GUIDED PARACENTESIS  Comparison:  None  An ultrasound guided paracentesis was thoroughly discussed with the patient and questions answered.  The benefits, risks, alternatives and complications were also discussed.  The patient understands and wishes to proceed with the procedure.  Written consent was obtained.  Ultrasound was performed to localize and mark an adequate pocket of fluid in the right lower quadrant of the abdomen.  The area was then prepped and draped in the normal sterile fashion.  1% Lidocaine was used for local anesthesia.  Under ultrasound guidance a 19 gauge Yueh catheter was introduced.  Paracentesis was performed.  The catheter was removed and a dressing applied.  Complications:  None immediate  Findings:  A total of approximately 2.5 liters of bloody serous fluid was removed.  A fluid sample was sent for laboratory analysis.  IMPRESSION: Successful ultrasound guided paracentesis yielding 2.5 liters of ascites.  Read by: Anselm Pancoast, P.A.-C  Original Report Authenticated By: Richarda Overlie, M.D.   Dg Chest Port 1 View  10/31/2011  *RADIOLOGY REPORT*  History:  Shortness of breath  Comparison:  10/28/2011  PORTABLE CHEST - 1 VIEW  Findings: Right-sided PICC line tip  terminates over the cavoatrial junction.  Mild cardiomegaly persists.  Lung volumes are extremely low, with curvilinear bilateral lower lobe atelectasis.  Trace left pleural effusion or atelectasis.  Right glenohumeral joint degenerative change.  Mild diffuse gaseous prominence of loops of bowel project in the upper abdomen.  IMPRESSION: Extremely low lung volumes with bilateral lower lobe atelectasis and possible trace left pleural effusion. If the patient's symptoms continue, consider PA and lateral chest radiographs obtained at full inspiration when the patient is clinically able.  Original Report Authenticated By: Harrel Lemon, M.D.   Dg Chest Port 1 View  10/28/2011  *RADIOLOGY REPORT*  Clinical Data: PICC line placement  PORTABLE CHEST - 1 VIEW  Comparison: September 16, 2010  Findings: The right upper extremity PICC line tip is at the cavoatrial junction.  No pneumothorax.  Cardiomegaly is unchanged. The mediastinum and pulmonary vasculature are within normal limits. Both lungs are clear.  IMPRESSION: PICC line  tip is at the cavoatrial junction.  No pneumothorax.  Original Report Authenticated By: Brandon Melnick, M.D.   Dg Abd 2 Views  10/27/2011  *RADIOLOGY REPORT*  Clinical Data: Abdominal distention, small bowel obstruction.  ABDOMEN - 2 VIEW  Comparison: 10/25/2011  Findings: Oral contrast material noted within decompressed colon. Mild prominence of the left abdominal small bowel loop again noted, likely not significantly changed.  No free air.  No organomegaly.  IMPRESSION: Continued prominence of left abdominal small bowel loop, not significantly changed.  Original Report Authenticated By: Cyndie Chime, M.D.   Dg Abd 2 Views  10/25/2011  *RADIOLOGY REPORT*  Clinical Data: Abdominal pain and bloating.  ABDOMEN - 2 VIEW  Comparison: None.  Findings: There are mildly dilated loops of small bowel in the left abdomen with a relative paucity of gas elsewhere.  Some air fluid levels are seen in  association. Right hip arthroplasty.  IMPRESSION: Dilated small bowel loops in the left abdomen with a relative paucity of gas elsewhere.  Findings are nonspecific.  Difficult to exclude partial or early small bowel obstruction.  Original Report Authenticated By: Reyes Ivan, M.D.   Acute Abdominal Series  10/26/2011  *RADIOLOGY REPORT*  Clinical Data: Follow up bowel obstruction  ACUTE ABDOMEN SERIES (ABDOMEN 2 VIEW & CHEST 1 VIEW)  Comparison: 10/25/2011  Findings: The heart size and mediastinal contours are within normal limits. Coarsened interstitial markings are identified compatible with COPD.  Both lungs are clear.  The visualized skeletal structures are unremarkable.  Since the previous exam there is been interval improvement in dilated small bowel loops.  Right lower quadrant small bowel measures up to 2.3 cm.  IMPRESSION:  1.  Interval improvement in small bowel obstruction pattern. 2.  COPD.  Original Report Authenticated By: Rosealee Albee, M.D.    Recent Results (from the past 240 hour(s))  SURGICAL PCR SCREEN     Status: Normal   Collection Time   10/29/11 12:12 AM      Component Value Range Status Comment   MRSA, PCR NEGATIVE  NEGATIVE  Final    Staphylococcus aureus NEGATIVE  NEGATIVE  Final   MRSA PCR SCREENING     Status: Normal   Collection Time   10/29/11  3:30 PM      Component Value Range Status Comment   MRSA by PCR NEGATIVE  NEGATIVE  Final     BRIEF ADMITTING H & P: 78 y.o. female with no prior abdominal surgery, history of coronary disease, prior CVA, hypertension, hyperlipidemia, presents with increased abdominal girth , abdominal cramps and pain diffusely, with mild nausea but no vomiting, and sent to the emergency room from her primary care physician's office. She did have a bowel movement earlier today. She denied any black stool bloody stool. She has no fever or chills. Evaluation included a plain x-ray which showed dilated bowel consistent with possible  early obstruction. Her laboratory study showed no elevation of white count and normal hemoglobin, potassium is low at 2.7 mEq per liter. She has normal renal function tests. Hospitalist was asked to admit her for possible partial small bowel obstruction.    Active Hospital Problems  Diagnoses Date Noted   . Cirrhosis of liver: Newly diagnosed by CT scan slightly elevated INR GI was consulted paracentesis was done that not significant. There is no significant a lot past medical history of alcohol abuse no history of hepatitis her ANA is other serologies are pending she will followup with GI as  an outpatient.  11/01/2011   . Hepatic insufficiency/Ascites: Status post paracentesis that showed no cause for her hepatic insufficiency, her cytology is pending and will followup with GI as an outpatient. She was started here in the hospital on spironolactone and Lasix. She was continue low-sodium diet and she should restrict her fluids to 2 L a day. Her creatinine and blood pressure have remains stable. She'll need a followup with her primary care doctor in 1 week to followup on her potassium and her creatinine.  11/01/2011   . CAD (coronary artery disease): Cardiology was consulted she was started indoor, and beta blocker.she's currently not on Plavix because of her history of GI bleed. No further recommendations by cardiology.  11/12/2010   . HYPERLIPIDEMIA-MIXED: Stable followup with her primary care Dr.  11/06/2009     Resolved Hospital Problems  Diagnoses Date Noted Date Resolved  . Partial small bowel obstruction: Surgery was consulted because the patient small bowel traction was not improving they recommended exploratory laparotomy. Also CT scan showed an intraluminal mass which was not seen exploratory laparotomy. The patient had bowel movement and was tolerating her diet she went home. So she was discharged home in stable condition to  11/03/2011 11/03/2011  . Edema: Has resolved she will go home  on Lasix and spironolactone will get a basic metabolic panel by her primary care doctor in 1 week.  11/04/2009 11/03/2011     Disposition and Follow-up:  Discharge Orders    Future Appointments: Provider: Department: Dept Phone: Center:   11/17/2011 9:30 AM Louis Meckel, MD [facility] [phone] [facility]     Future Orders Please Complete By Expires   Diet - low sodium heart healthy      Increase activity slowly        Follow-up Information    Follow up with TOTH III,PAUL S, MD in 2 weeks.   Contact information:   [employer] [address] [place] [address] [phone]       Follow up with Melvia Heaps, MD on 11/17/2011. (9:30 AM.  to follow up liver problems)    Contact information:   [address] [facility] [address] [place] [address] [place] [address] [phone]           DISCHARGE EXAM:  See progress note  Blood pressure 137/68, pulse 88, temperature 97.8 F (36.6 C), temperature source Oral, resp. rate 18, height 5' (1.524 m), weight 63.821 kg (140 lb 11.2 oz), SpO2 91.00%.   Basename 11/02/11 0450 11/01/11 0420  NA 135 134*  K 4.0 3.7  CL 104 102  CO2 24 24  GLUCOSE 87 107*  BUN 14 10  CREATININE 0.87 0.79  CALCIUM 8.4 8.4  MG -- --  PHOS -- --    Basename 11/01/11 0420  AST 20  ALT 10  ALKPHOS 58  BILITOT 2.3*  PROT 5.4*  ALBUMIN 2.0*   No results found for this basename: LIPASE:2,AMYLASE:2 in the last 72 hours  Basename 11/01/11 0420  WBC 9.4  NEUTROABS --  HGB 14.0  HCT 40.1  MCV 85.7  PLT 137*    Signed: Marinda Elk M.D. 11/03/2011, 9:27 AM

## 2011-11-03 NOTE — Progress Notes (Signed)
Consider cal counts Mobilize more Skeptical that pt can go straight to home w [facility] & prob needs SNF, but all we can do is recommend to the patient.Marland KitchenMarland Kitchen

## 2011-11-03 NOTE — Progress Notes (Signed)
   CARE MANAGEMENT NOTE 11/03/2011  Patient:  [name], [name]   Account Number:  [id number]  Date Initiated:  10/26/2011  Documentation initiated by:  Letha Cape  Subjective/Objective Assessment:   dx sbo  admit- lives with spouse.     Action/Plan:   pt eval- no pt f/u  11/02/11- PT/OT eval post op- ?HH-PT/OT   Anticipated DC Date:  10/27/2011   Anticipated DC Plan:  HOME/SELF CARE      DC Planning Services  CM consult      [facility] Choice  HOME HEALTH   Choice offered to / List presented to:  C-3 Spouse        HH arranged  HH-1 RN  HH-2 PT  HH-4 NURSE'S AIDE      HH agency  [facility]   Status of service:  Completed, signed off  Discharge Disposition:  HOME W HOME HEALTH SERVICES  If discussed at Long Length of Stay Meetings, dates discussed:   11/03/2011    Comments:  11/03/11 Verdis Prime RN MSN CCM Met with pt and spouse @ bedside.  Provided list of home health agencies as pt will need RN, PT, and aide.  Spouse states [facility] has provided services previously and requests referral.  RA O2 sat 88%, discussed home oxygen with pt and spouse.  Pt states she had home oxygen before and did not use it, refuses it now.  11/02/11- 1030- Donn Pierini Charity fundraiser, BSN [phone] Pt s/p exp lap- PT/OT working with pt- may need HH- pending progress. CM to continue to follow for d/c planning needs.  10/29/11- 1045- Donn Pierini RN, BSN [phone] Pt for OR today for exp. lap with small bowel resection, will f/u post operatively for potential d/c needs.  10/26/11 16:42 Letha Cape RN, BSN [phone] patient lives with spouse, per physical therapy pt has not pt needs.  NCM will continue to follow for dc needs. Patient has medication coverage and transportation.

## 2011-11-04 NOTE — Progress Notes (Signed)
She should have f/u EGD to evaluate for varices.

## 2011-11-09 ENCOUNTER — Encounter (INDEPENDENT_AMBULATORY_CARE_PROVIDER_SITE_OTHER): Payer: Self-pay | Admitting: General Surgery

## 2011-11-09 ENCOUNTER — Ambulatory Visit (INDEPENDENT_AMBULATORY_CARE_PROVIDER_SITE_OTHER): Payer: Medicare Other | Admitting: General Surgery

## 2011-11-09 VITALS — BP 146/84 | HR 78 | Resp 18 | Ht 62.0 in | Wt 140.0 lb

## 2011-11-09 DIAGNOSIS — K7460 Unspecified cirrhosis of liver: Secondary | ICD-10-CM

## 2011-11-09 NOTE — Progress Notes (Signed)
Subjective:     Patient ID: [name], female   DOB: March 15, 1929, 78 y.o.   MRN: [id number]  HPI The patient is a 78 year old white female who was recently hospitalized. She was thought to have a small bowel tumor causing an obstruction. At the time of her laparotomy she was found to have cirrhosis of the liver but no tumor was found. She has improved. She is now at home. Her appetite is good and her bowels are working normally.  Review of Systems     Objective:   Physical Exam On exam her abdomen is soft and nontender. Her incision is healing nicely. Her staples were removed today and she tolerated this well   Assessment:     Cirrhosis of the liver    Plan:     At this point she will need to continue to follow up with her medical doctors and her gastroenterologist to try to figure out why she has cirrhosis of the liver. From a surgical standpoint she is healing well. We will plan to see her back in one month to check her progress.

## 2011-11-17 ENCOUNTER — Ambulatory Visit (INDEPENDENT_AMBULATORY_CARE_PROVIDER_SITE_OTHER): Payer: Medicare Other | Admitting: Gastroenterology

## 2011-11-17 ENCOUNTER — Encounter: Payer: Self-pay | Admitting: Gastroenterology

## 2011-11-17 VITALS — BP 122/70 | HR 87 | Ht 62.0 in | Wt 141.8 lb

## 2011-11-17 DIAGNOSIS — K7460 Unspecified cirrhosis of liver: Secondary | ICD-10-CM

## 2011-11-17 MED ORDER — MONTELUKAST SODIUM 10 MG PO TABS: 10.0000 mg | ORAL_TABLET | Freq: Every day | ORAL | Status: DC

## 2011-11-17 MED ORDER — SPIRONOLACTONE 100 MG PO TABS: 100.0000 mg | ORAL_TABLET | Freq: Two times a day (BID) | ORAL | Status: DC

## 2011-11-17 NOTE — Assessment & Plan Note (Addendum)
Pt has cryptogenic cirrhosis with hypoalbuminemia and ascites of unknown etiology.  Endoscopy one year ago demonstrated a duodenal polyp which was removed by band ligation. No varices were seen. Exam is notable for ascites.  Recommendations #1 Aldactone 100 mg twice a day; continue Lasix #2 upper endoscopy to rule out varices #3 vaccinations for hepatitis A and B. #4 followup BMET in one month

## 2011-11-17 NOTE — Patient Instructions (Addendum)
You are having a Hepatitis A&B Vaccination today Your next injection is due in 7 days from today   11/24/2011 AT 9AM Injection 3 will be due in 21-30 days from your first injection Injection 4 will be due in one year to complete your vaccinations  Your procedure is scheduled at [facility] on 12/22/2011 at 12:30pm Separate instructions have been given  You will need labs draw in again in 4 weeks

## 2011-11-17 NOTE — Progress Notes (Signed)
History of Present Illness: [name] is an 78 year old white female with newly diagnosed cryptogenic cirrhosis. This was noted by CT scan when she was recently hospitalized for small bowel obstruction. She underwent surgery for a possible small bowel mass and intussusception. The small bowel was normal. A nodular liver was seen at surgery. Ascites was tapped. She complains of weakness and fullness in her abdomen. There is no history of alcohol abuse or hepatitis. Serologies for hepatitis A and B., ANA, AMA and ferritin levels were normal.    Past Medical History  Diagnosis Date  . Coronary artery disease   . CVA (cerebral vascular accident)   . GERD (gastroesophageal reflux disease)   . Hypertension   . Femoral neck fracture 12/2008    right  . Asthma   . Hyperlipidemia   . Breast cancer   . Cirrhosis of liver    Past Surgical History  Procedure Date  . Hemiarthroplasty hip   . Cardiac catheterization   . Bilateral mastectomy   . Abdominal hysterectomy   . Laparotomy 10/29/2011    Procedure: EXPLORATORY LAPAROTOMY;  Surgeon: Robyne Askew, MD;  Location: [facility] OR;  Service: General;  Laterality: N/A;  EXPLORATORY LAPAROTOMY, LYSIS OF ADHESIONS, PERITONEAL WASHINGS.   family history includes Heart attack in her son. Current Outpatient Prescriptions  Medication Sig Dispense Refill  . fluocinonide cream (LIDEX) 0.05 % Apply 1 application topically 2 (two) times daily. To affected area      . furosemide (LASIX) 20 MG tablet Take 1 tablet (20 mg total) by mouth daily.  30 tablet  0  . montelukast (SINGULAIR) 10 MG tablet Take 10 mg by mouth at bedtime.       . nitroGLYCERIN (NITROSTAT) 0.4 MG SL tablet Place 0.4 mg under the tongue every 5 (five) minutes as needed. Chest pain      . omeprazole (PRILOSEC) 20 MG capsule Take 20 mg by mouth daily.       Marland Kitchen DISCONTD: ferrous sulfate 325 (65 FE) MG tablet Take 325 mg by mouth. Take one tablet twice a day.        Allergies as of 11/17/2011 -  Review Complete 11/17/2011  Allergen Reaction Noted  . Codeine  11/06/2009    reports that she has never smoked. She has never used smokeless tobacco. She reports that she does not drink alcohol or use illicit drugs.     Review of Systems: Pertinent positive and negative review of systems were noted in the above HPI section. All other review of systems were otherwise negative.  Vital signs were reviewed in today's medical record Physical Exam: General: Chronically ill-appearing Head: Normocephalic and atraumatic Eyes:  sclerae anicteric, EOMI Ears: Normal auditory acuity Mouth: No deformity or lesions Neck: Supple, no masses or thyromegaly Lungs: Clear throughout to auscultation Heart: Regular rate and rhythm; no murmurs, rubs or bruits Abdomen: Soft, non tender; abdomen is grossly distended with obvious ascites.  Rectal:deferred Musculoskeletal: Symmetrical with no gross deformities  Skin: No lesions on visible extremities Pulses:  Normal pulses noted Extremities: No clubbing, cyanosis, or deformities noted. There is 1+ ankle edema Neurological: Alert oriented x 4, grossly nonfocal Cervical Nodes:  No significant cervical adenopathy Inguinal Nodes: No significant inguinal adenopathy Psychological:  Alert and cooperative. Normal mood and affect

## 2011-11-18 ENCOUNTER — Telehealth: Payer: Self-pay | Admitting: Gastroenterology

## 2011-11-18 NOTE — Telephone Encounter (Signed)
Left message to call back  

## 2011-11-18 NOTE — Telephone Encounter (Signed)
[facility] nurse states that the pt was a little short of breath today. Asking if Dr. Arlyce Dice is looking at doing a paracentesis on the pt. Pt has asthma and just filled her inhaler rx and is to take the aldactone and lasix. Pt had a paracentesis while she was hospitalized but only had a liter and a half drawn off. Pt was instructed to go the hospital if she had increased SOB by the [facility] rn.  [facility] rn also wants to know if she could give the twinrix injection for the pt. [facility] rn aware that Dr. Arlyce Dice is out of the office until Tuesday. Dr. Arlyce Dice please advise.

## 2011-11-23 ENCOUNTER — Telehealth: Payer: Self-pay | Admitting: Gastroenterology

## 2011-11-23 NOTE — Telephone Encounter (Signed)
Spoke with [name] and she states they only want to see their family physician from now on and they requested to cancel all of their appointments here and the procedure at the hospital. Dr. Arlyce Dice aware and appts cancelled.

## 2011-11-23 NOTE — Telephone Encounter (Signed)
Find out what her weight is. What is twinrix injection?  If it is the vaccine, OK.

## 2011-11-23 NOTE — Telephone Encounter (Signed)
Spoke with home health nurse and she states the pt does not weigh. The SOB has continued and she has an appt with her PCP Dr. [name] in [place].

## 2011-11-23 NOTE — Telephone Encounter (Signed)
ok 

## 2011-12-08 ENCOUNTER — Encounter (INDEPENDENT_AMBULATORY_CARE_PROVIDER_SITE_OTHER): Payer: Self-pay | Admitting: General Surgery

## 2011-12-08 ENCOUNTER — Ambulatory Visit (INDEPENDENT_AMBULATORY_CARE_PROVIDER_SITE_OTHER): Payer: Medicare Other | Admitting: General Surgery

## 2011-12-08 VITALS — BP 104/48 | HR 80 | Temp 98.0°F | Resp 14 | Ht 62.0 in | Wt 107.0 lb

## 2011-12-08 DIAGNOSIS — K7460 Unspecified cirrhosis of liver: Secondary | ICD-10-CM

## 2011-12-08 NOTE — Progress Notes (Signed)
Subjective:     Patient ID: [name], female   DOB: 12-31-1928, 78 y.o.   MRN: [id number]  HPI The patient is an 78 year old white female who is about 6 weeks out from an exploratory laparotomy for what was thought to be a small bowel intussusception. At the time of her surgery no tumor or intussusception were found. She did have cirrhosis of the liver. This is now being managed by her medical doctor. She denies any abdominal pain. Her appetite is slowly improving.  Review of Systems     Objective:   Physical Exam On exam her abdomen is soft and nontender. Her midline incision has healed nicely.    Assessment:     6 weeks status post exploratory laparotomy    Plan:     At this point I believe she can return to her normal activities without any restrictions. We will plan to see her back on a p.r.n. basis. Management of her cirrhosis will be taken care of by her medical doctor

## 2011-12-16 ENCOUNTER — Telehealth: Payer: Self-pay | Admitting: *Deleted

## 2011-12-16 NOTE — Telephone Encounter (Signed)
Message copied by Marlowe Kays on Thu Dec 16, 2011  2:40 PM ------      Message from: Marlowe Kays      Created: Wed Nov 17, 2011  2:26 PM      Regarding: labs       Cal pt for labs 1 month orders in

## 2011-12-22 ENCOUNTER — Ambulatory Visit (HOSPITAL_COMMUNITY): Admission: RE | Admit: 2011-12-22 | Payer: Medicare Other | Source: Ambulatory Visit | Admitting: Gastroenterology

## 2011-12-22 ENCOUNTER — Encounter (HOSPITAL_COMMUNITY): Admission: RE | Payer: Self-pay | Source: Ambulatory Visit

## 2011-12-22 SURGERY — EGD (ESOPHAGOGASTRODUODENOSCOPY)
Anesthesia: Moderate Sedation

## 2012-01-06 NOTE — Telephone Encounter (Signed)
Sent patient a letter to remind her of labs today

## 2012-03-21 ENCOUNTER — Ambulatory Visit (INDEPENDENT_AMBULATORY_CARE_PROVIDER_SITE_OTHER): Payer: Medicare Other | Admitting: Cardiovascular Disease

## 2012-03-21 ENCOUNTER — Encounter: Payer: Self-pay | Admitting: Cardiovascular Disease

## 2012-03-21 VITALS — BP 118/60 | HR 88 | Ht 63.0 in | Wt 112.2 lb

## 2012-03-21 DIAGNOSIS — I2510 Atherosclerotic heart disease of native coronary artery without angina pectoris: Secondary | ICD-10-CM

## 2012-03-21 DIAGNOSIS — K561 Intussusception: Secondary | ICD-10-CM

## 2012-03-21 DIAGNOSIS — K7460 Unspecified cirrhosis of liver: Secondary | ICD-10-CM

## 2012-03-21 DIAGNOSIS — E785 Hyperlipidemia, unspecified: Secondary | ICD-10-CM

## 2012-03-21 NOTE — Assessment & Plan Note (Signed)
Currently with no symptoms of angina. No further workup at this time. Continue current medication regimen. 

## 2012-03-21 NOTE — Assessment & Plan Note (Signed)
She is not interested in a cholesterol medication

## 2012-03-21 NOTE — Patient Instructions (Addendum)
You are doing well. No medication changes Consider decreasing the lasix to 40 mg daily if no significant swelling  Please call us if you have new issues that need to be addressed before your next appt.  Your physician wants you to follow-up in: 6 months.  You will receive a reminder letter in the mail two months in advance. If you don't receive a letter, please call our office to schedule the follow-up appointment.

## 2012-03-21 NOTE — Assessment & Plan Note (Signed)
She appears to have recovered well from her abdominal surgery. No complaints. Occasional change in her bowels.

## 2012-03-21 NOTE — Assessment & Plan Note (Signed)
She is on Lasix 80 mg daily with improved abdominal and lower from the swelling. no longer on Tylenol.

## 2012-03-21 NOTE — Progress Notes (Signed)
Patient ID: [name], female    DOB: 01-Jun-1929, 78 y.o.   MRN: [id number]  HPI Comments: 78 yo with history of CAD s/p PCI in 1999 with peri-cath CVA,   Previous stress test  which showed ischemia in the inferior and inferolateral wall. She refused cardiac catheterization and has been treated medically. h/o significant GI bleed, admitted to [facility] with significant malaise, initially treated as chest pain, found to have profound anemia and was transfused at least 4 units of packed red blood cells. She had an EGD. Her Plavix has been held. She does not take aspirin as she reports having a rash.  she does not want to take a statin despite history of coronary artery disease and stroke. She has had a long hospital course for small bowel intussusception. Diagnosis of cryptogenic cirrhosis. Through this long hospital course, she had 35 pound weight loss following discharge with suspected fluid overload that did respond to high-dose Lasix 80 mg daily. Her abdominal swelling and leg edema has improved on Lasix 80 mg daily.  Her strength is better, she is walking with a walker and appetite has improved. She denies any significant shortness of breath or chest pain. Rare edema, currently no swelling.  She is not taking ASA as she says that she "cannot take it" because even a baby aspirin makes her "run up the wall."     EKG shows normal sinus rhythm with rate 88 beats per minute with no significant ST or T wave changes, poor R wave progression to the anterior precordial leads, unable to rule out old anterior infarct    Outpatient Encounter Prescriptions as of 03/21/2012  Medication Sig Dispense Refill  . fluocinonide cream (LIDEX) 0.05 % Apply 1 application topically 2 (two) times daily. To affected area      . furosemide (LASIX) 80 MG tablet Take 80 mg by mouth daily.       . nitroGLYCERIN (NITROSTAT) 0.4 MG SL tablet Place 0.4 mg under the tongue every 5 (five) minutes as needed. Chest pain       . omeprazole (PRILOSEC) 20 MG capsule Take 20 mg by mouth daily.       Marland Kitchen spironolactone (ALDACTONE) 100 MG tablet Take 100 mg by mouth daily.      Marland Kitchen DISCONTD: spironolactone (ALDACTONE) 100 MG tablet Take 1 tablet (100 mg total) by mouth 2 (two) times daily.  60 tablet  2     Review of Systems  HENT: Negative.   Eyes: Negative.   Respiratory: Negative.   Cardiovascular: Negative.   Gastrointestinal: Negative.   Musculoskeletal: Positive for gait problem.  Skin: Negative.   Neurological: Positive for weakness.  Hematological: Negative.   Psychiatric/Behavioral: Negative.   All other systems reviewed and are negative.   BP 118/60  Pulse 88  Ht 5\' 3"  (1.6 m)  Wt 112 lb 4 oz (50.916 kg)  BMI 19.88 kg/m2  Physical Exam  Nursing note and vitals reviewed. Constitutional: She is oriented to person, place, and time. She appears well-developed and well-nourished.  HENT:  Head: Normocephalic.  Nose: Nose normal.  Mouth/Throat: Oropharynx is clear and moist.  Eyes: Conjunctivae are normal. Pupils are equal, round, and reactive to light.  Neck: Normal range of motion. Neck supple. No JVD present. Carotid bruit is present.  Cardiovascular: Normal rate, regular rhythm, S1 normal, S2 normal and intact distal pulses.  Exam reveals no gallop and no friction rub.   Murmur heard.  Systolic murmur is present with  a grade of 2/6  Pulmonary/Chest: Effort normal and breath sounds normal. No respiratory distress. She has no wheezes. She has no rales. She exhibits no tenderness.  Abdominal: Soft. Bowel sounds are normal. She exhibits no distension. There is no tenderness.  Musculoskeletal: Normal range of motion. She exhibits no tenderness.  Lymphadenopathy:    She has no cervical adenopathy.  Neurological: She is alert and oriented to person, place, and time. Coordination normal.  Skin: Skin is warm and dry. No rash noted. No erythema.  Psychiatric: She has a normal mood and affect. Her  behavior is normal. Judgment and thought content normal.         Assessment and Plan

## 2012-10-11 ENCOUNTER — Encounter: Payer: Self-pay | Admitting: Cardiovascular Disease

## 2012-10-11 ENCOUNTER — Ambulatory Visit (INDEPENDENT_AMBULATORY_CARE_PROVIDER_SITE_OTHER): Payer: Medicare Other | Admitting: Cardiovascular Disease

## 2012-10-11 VITALS — BP 130/68 | HR 89 | Ht 63.0 in | Wt 121.5 lb

## 2012-10-11 DIAGNOSIS — R079 Chest pain, unspecified: Secondary | ICD-10-CM

## 2012-10-11 DIAGNOSIS — E785 Hyperlipidemia, unspecified: Secondary | ICD-10-CM

## 2012-10-11 DIAGNOSIS — R609 Edema, unspecified: Secondary | ICD-10-CM

## 2012-10-11 DIAGNOSIS — I2510 Atherosclerotic heart disease of native coronary artery without angina pectoris: Secondary | ICD-10-CM

## 2012-10-11 NOTE — Assessment & Plan Note (Signed)
Currently with no symptoms of angina. No further workup at this time. Continue current medication regimen. 

## 2012-10-11 NOTE — Assessment & Plan Note (Signed)
Edema has resolved. Continue Lasix

## 2012-10-11 NOTE — Assessment & Plan Note (Signed)
Atypical chest pain on the right side of her chest. Likely musculoskeletal. She will continue Aleve periodically.

## 2012-10-11 NOTE — Progress Notes (Signed)
Patient ID: [name], female    DOB: 1929-05-02, 79 y.o.   MRN: [id number]  HPI Comments: 79 yo with history of CAD s/p PCI in 1999 with peri-cath CVA,  previous stress test  which showed ischemia in the inferior and inferolateral wall. She refused cardiac catheterization and has been treated medically. h/o significant GI bleed, admitted to [facility] with significant malaise, initially treated as chest pain, found to have profound anemia and was transfused at least 4 units of packed red blood cells. She had an EGD. Her Plavix has been held. She does not take aspirin as she reports having a rash.  she does not want to take a statin despite history of coronary artery disease and stroke. previous long hospital course for small bowel intussusception. Diagnosis of cryptogenic cirrhosis. Her abdominal swelling and leg edema has improved on Lasix 80 mg daily.   she is walking with a walker and appetite has improved.her weight is up 7 pounds from prior clinic visit. No recent falls. She does not go outside very much.  She denies any significant shortness of breath or chest pain. Rare edema, currently no swelling. She was previously having "stupor"episodes per the son. He seemed to resolve when she stopped spironolactone. She does report having rare episodes of right-sided chest pain radiating to the shoulder and right breast. Seems to be positional, worse when she lies down. She takes Aleve periodically  She is not taking ASA as she says that she "cannot take it" because even a baby aspirin makes her "run up the wall."     EKG shows normal sinus rhythm with rate 89 beats per minute with no significant ST or T wave changes, poor R wave progression to the anterior precordial leads, unable to rule out old anterior infarct    Outpatient Encounter Prescriptions as of 10/11/2012  Medication Sig Dispense Refill  . diazepam (VALIUM) 5 MG tablet Take 5 mg by mouth as needed.       . furosemide (LASIX)  80 MG tablet Take 80 mg by mouth daily.       . montelukast (SINGULAIR) 10 MG tablet Take 10 mg by mouth as needed.       . nitroGLYCERIN (NITROSTAT) 0.4 MG SL tablet Place 0.4 mg under the tongue every 5 (five) minutes as needed. Chest pain      . omeprazole (PRILOSEC) 20 MG capsule Take 20 mg by mouth daily.       . potassium chloride (K-DUR) 10 MEQ tablet Take 10 mEq by mouth 2 (two) times daily.       . VENTOLIN HFA 108 (90 BASE) MCG/ACT inhaler 2 puffs as needed.       . [DISCONTINUED] fluocinonide cream (LIDEX) 0.05 % Apply 1 application topically 2 (two) times daily. To affected area      . [DISCONTINUED] spironolactone (ALDACTONE) 100 MG tablet Take 100 mg by mouth daily.       No facility-administered encounter medications on file as of 10/11/2012.     Review of Systems  HENT: Negative.   Eyes: Negative.   Respiratory: Negative.   Cardiovascular: Negative.   Gastrointestinal: Negative.   Musculoskeletal: Positive for gait problem.  Skin: Negative.   Psychiatric/Behavioral: Negative.   All other systems reviewed and are negative.   BP 130/68  Pulse 89  Ht 5\' 3"  (1.6 m)  Wt 121 lb 8 oz (55.112 kg)  BMI 21.53 kg/m2  Physical Exam  Nursing note and vitals reviewed. Constitutional:  She is oriented to person, place, and time. She appears well-developed and well-nourished.  HENT:  Head: Normocephalic.  Nose: Nose normal.  Mouth/Throat: Oropharynx is clear and moist.  Eyes: Conjunctivae are normal. Pupils are equal, round, and reactive to light.  Neck: Normal range of motion. Neck supple. No JVD present. Carotid bruit is present.  Cardiovascular: Normal rate, regular rhythm, S1 normal, S2 normal and intact distal pulses.  Exam reveals no gallop and no friction rub.   Murmur heard.  Systolic murmur is present with a grade of 2/6  Pulmonary/Chest: Effort normal and breath sounds normal. No respiratory distress. She has no wheezes. She has no rales. She exhibits no tenderness.   Abdominal: Soft. Bowel sounds are normal. She exhibits no distension. There is no tenderness.  Musculoskeletal: Normal range of motion. She exhibits no edema and no tenderness.  Lymphadenopathy:    She has no cervical adenopathy.  Neurological: She is alert and oriented to person, place, and time. Coordination normal.  Skin: Skin is warm and dry. No rash noted. No erythema.  Psychiatric: She has a normal mood and affect. Her behavior is normal. Judgment and thought content normal.    Assessment and Plan

## 2012-10-11 NOTE — Assessment & Plan Note (Signed)
She does not want a statin

## 2012-10-11 NOTE — Patient Instructions (Addendum)
You are doing well. No medication changes were made.  Continue lasix 40 mg twice a day See if Dr. Clarene Duke will check your kidney function/potassium  Please call us if you have new issues that need to be addressed before your next appt.  Your physician wants you to follow-up in: 6 months.  You will receive a reminder letter in the mail two months in advance. If you don't receive a letter, please call our office to schedule the follow-up appointment.

## 2012-10-23 ENCOUNTER — Telehealth: Payer: Self-pay

## 2012-10-23 NOTE — Telephone Encounter (Signed)
I called to request most recent labs on pt from Dr. Clarene Duke They will be seeing pt/getting labs 10/26/12 We will get results at that time

## 2013-01-13 ENCOUNTER — Inpatient Hospital Stay (HOSPITAL_COMMUNITY)
Admission: EM | Admit: 2013-01-13 | Discharge: 2013-01-17 | DRG: 480 | Disposition: A | Discharge status: Skilled Nursing Facility | Payer: Medicare Other | Attending: Internal Medicine | Admitting: Internal Medicine

## 2013-01-13 ENCOUNTER — Inpatient Hospital Stay (HOSPITAL_COMMUNITY): Payer: Medicare Other

## 2013-01-13 ENCOUNTER — Emergency Department (HOSPITAL_COMMUNITY): Payer: Medicare Other

## 2013-01-13 ENCOUNTER — Encounter (HOSPITAL_COMMUNITY): Payer: Self-pay

## 2013-01-13 DIAGNOSIS — S72009A Fracture of unspecified part of neck of unspecified femur, initial encounter for closed fracture: Secondary | ICD-10-CM

## 2013-01-13 DIAGNOSIS — I248 Other forms of acute ischemic heart disease: Secondary | ICD-10-CM | POA: Diagnosis present

## 2013-01-13 DIAGNOSIS — Z853 Personal history of malignant neoplasm of breast: Secondary | ICD-10-CM

## 2013-01-13 DIAGNOSIS — I2510 Atherosclerotic heart disease of native coronary artery without angina pectoris: Secondary | ICD-10-CM | POA: Diagnosis present

## 2013-01-13 DIAGNOSIS — S72002A Fracture of unspecified part of neck of left femur, initial encounter for closed fracture: Secondary | ICD-10-CM

## 2013-01-13 DIAGNOSIS — I2489 Other forms of acute ischemic heart disease: Secondary | ICD-10-CM | POA: Diagnosis present

## 2013-01-13 DIAGNOSIS — W19XXXA Unspecified fall, initial encounter: Secondary | ICD-10-CM | POA: Diagnosis present

## 2013-01-13 DIAGNOSIS — Z9010 Acquired absence of unspecified breast and nipple: Secondary | ICD-10-CM

## 2013-01-13 DIAGNOSIS — N182 Chronic kidney disease, stage 2 (mild): Secondary | ICD-10-CM | POA: Diagnosis present

## 2013-01-13 DIAGNOSIS — K7460 Unspecified cirrhosis of liver: Secondary | ICD-10-CM | POA: Diagnosis present

## 2013-01-13 DIAGNOSIS — Z8249 Family history of ischemic heart disease and other diseases of the circulatory system: Secondary | ICD-10-CM

## 2013-01-13 DIAGNOSIS — I129 Hypertensive chronic kidney disease with stage 1 through stage 4 chronic kidney disease, or unspecified chronic kidney disease: Secondary | ICD-10-CM | POA: Diagnosis present

## 2013-01-13 DIAGNOSIS — N289 Disorder of kidney and ureter, unspecified: Secondary | ICD-10-CM | POA: Diagnosis present

## 2013-01-13 DIAGNOSIS — J449 Chronic obstructive pulmonary disease, unspecified: Secondary | ICD-10-CM | POA: Diagnosis present

## 2013-01-13 DIAGNOSIS — Z8673 Personal history of transient ischemic attack (TIA), and cerebral infarction without residual deficits: Secondary | ICD-10-CM

## 2013-01-13 DIAGNOSIS — Z9861 Coronary angioplasty status: Secondary | ICD-10-CM

## 2013-01-13 DIAGNOSIS — R1312 Dysphagia, oropharyngeal phase: Secondary | ICD-10-CM | POA: Diagnosis present

## 2013-01-13 DIAGNOSIS — Z79899 Other long term (current) drug therapy: Secondary | ICD-10-CM

## 2013-01-13 DIAGNOSIS — M810 Age-related osteoporosis without current pathological fracture: Secondary | ICD-10-CM | POA: Diagnosis present

## 2013-01-13 DIAGNOSIS — R64 Cachexia: Secondary | ICD-10-CM | POA: Diagnosis present

## 2013-01-13 DIAGNOSIS — S72033A Displaced midcervical fracture of unspecified femur, initial encounter for closed fracture: Principal | ICD-10-CM | POA: Diagnosis present

## 2013-01-13 DIAGNOSIS — Z66 Do not resuscitate: Secondary | ICD-10-CM | POA: Diagnosis present

## 2013-01-13 DIAGNOSIS — K219 Gastro-esophageal reflux disease without esophagitis: Secondary | ICD-10-CM | POA: Diagnosis present

## 2013-01-13 DIAGNOSIS — R062 Wheezing: Secondary | ICD-10-CM

## 2013-01-13 DIAGNOSIS — Z96649 Presence of unspecified artificial hip joint: Secondary | ICD-10-CM

## 2013-01-13 DIAGNOSIS — D509 Iron deficiency anemia, unspecified: Secondary | ICD-10-CM | POA: Diagnosis present

## 2013-01-13 DIAGNOSIS — S72142S Displaced intertrochanteric fracture of left femur, sequela: Secondary | ICD-10-CM

## 2013-01-13 DIAGNOSIS — E46 Unspecified protein-calorie malnutrition: Secondary | ICD-10-CM | POA: Diagnosis present

## 2013-01-13 DIAGNOSIS — S72002S Fracture of unspecified part of neck of left femur, sequela: Secondary | ICD-10-CM

## 2013-01-13 DIAGNOSIS — D649 Anemia, unspecified: Secondary | ICD-10-CM

## 2013-01-13 DIAGNOSIS — I214 Non-ST elevation (NSTEMI) myocardial infarction: Secondary | ICD-10-CM | POA: Clinically undetermined

## 2013-01-13 DIAGNOSIS — J4489 Other specified chronic obstructive pulmonary disease: Secondary | ICD-10-CM | POA: Diagnosis present

## 2013-01-13 DIAGNOSIS — IMO0002 Reserved for concepts with insufficient information to code with codable children: Secondary | ICD-10-CM

## 2013-01-13 DIAGNOSIS — E785 Hyperlipidemia, unspecified: Secondary | ICD-10-CM | POA: Diagnosis present

## 2013-01-13 DIAGNOSIS — E43 Unspecified severe protein-calorie malnutrition: Secondary | ICD-10-CM | POA: Diagnosis present

## 2013-01-13 DIAGNOSIS — T17800A Unspecified foreign body in other parts of respiratory tract causing asphyxiation, initial encounter: Secondary | ICD-10-CM

## 2013-01-13 DIAGNOSIS — Y92009 Unspecified place in unspecified non-institutional (private) residence as the place of occurrence of the external cause: Secondary | ICD-10-CM

## 2013-01-13 HISTORY — DX: Other cirrhosis of liver: K74.69

## 2013-01-13 HISTORY — DX: Gastrointestinal hemorrhage, unspecified: K92.2

## 2013-01-13 LAB — BASIC METABOLIC PANEL
BUN: 21 mg/dL (ref 6–23)
CO2: 31 mEq/L (ref 19–32)
Glucose, Bld: 109 mg/dL — ABNORMAL HIGH (ref 70–99)
Potassium: 3.9 mEq/L (ref 3.5–5.1)
Sodium: 135 mEq/L (ref 135–145)

## 2013-01-13 LAB — CBC
HCT: 21.4 % — ABNORMAL LOW (ref 36.0–46.0)
HCT: 23.3 % — ABNORMAL LOW (ref 36.0–46.0)
Hemoglobin: 7 g/dL — ABNORMAL LOW (ref 12.0–15.0)
Hemoglobin: 7.6 g/dL — ABNORMAL LOW (ref 12.0–15.0)
MCH: 21.5 pg — ABNORMAL LOW (ref 26.0–34.0)
MCHC: 32.6 g/dL (ref 30.0–36.0)
RBC: 3.25 MIL/uL — ABNORMAL LOW (ref 3.87–5.11)
RBC: 3.52 MIL/uL — ABNORMAL LOW (ref 3.87–5.11)
WBC: 6.5 10*3/uL (ref 4.0–10.5)

## 2013-01-13 LAB — TECHNOLOGIST SMEAR REVIEW

## 2013-01-13 LAB — URINALYSIS, ROUTINE W REFLEX MICROSCOPIC
Ketones, ur: NEGATIVE mg/dL
Nitrite: NEGATIVE
Protein, ur: NEGATIVE mg/dL
Urobilinogen, UA: 0.2 mg/dL (ref 0.0–1.0)

## 2013-01-13 LAB — HEPATIC FUNCTION PANEL
Albumin: 2.9 g/dL — ABNORMAL LOW (ref 3.5–5.2)
Alkaline Phosphatase: 64 U/L (ref 39–117)
Indirect Bilirubin: 0.4 mg/dL (ref 0.3–0.9)
Total Protein: 6.1 g/dL (ref 6.0–8.3)

## 2013-01-13 LAB — LACTATE DEHYDROGENASE: LDH: 176 U/L (ref 94–250)

## 2013-01-13 LAB — PREPARE RBC (CROSSMATCH)

## 2013-01-13 MED ORDER — FUROSEMIDE 10 MG/ML IJ SOLN
20.0000 mg | Freq: Once | INTRAMUSCULAR | Status: AC
  Administered 2013-01-13: 20 mg via INTRAVENOUS
  Filled 2013-01-13: qty 2

## 2013-01-13 MED ORDER — PANTOPRAZOLE SODIUM 40 MG IV SOLR
40.0000 mg | Freq: Two times a day (BID) | INTRAVENOUS | Status: DC
  Administered 2013-01-13 – 2013-01-16 (×5): 40 mg via INTRAVENOUS
  Filled 2013-01-13 (×9): qty 40

## 2013-01-13 MED ORDER — HEPARIN SODIUM (PORCINE) 5000 UNIT/ML IJ SOLN
5000.0000 [IU] | Freq: Three times a day (TID) | INTRAMUSCULAR | Status: DC
  Filled 2013-01-13 (×2): qty 1

## 2013-01-13 MED ORDER — PANTOPRAZOLE SODIUM 40 MG PO TBEC: 40.0000 mg | DELAYED_RELEASE_TABLET | Freq: Every day | ORAL | Status: DC

## 2013-01-13 MED ORDER — MORPHINE SULFATE 4 MG/ML IJ SOLN
4.0000 mg | Freq: Once | INTRAMUSCULAR | Status: AC
  Administered 2013-01-13: 4 mg via INTRAVENOUS
  Filled 2013-01-13: qty 1

## 2013-01-13 MED ORDER — MORPHINE SULFATE 2 MG/ML IJ SOLN
2.0000 mg | INTRAMUSCULAR | Status: DC | PRN
  Administered 2013-01-13: 2 mg via INTRAVENOUS
  Administered 2013-01-14: 1 mg via INTRAVENOUS
  Filled 2013-01-13 (×2): qty 1

## 2013-01-13 MED ORDER — CEFAZOLIN SODIUM-DEXTROSE 2-3 GM-% IV SOLR
2.0000 g | INTRAVENOUS | Status: DC
  Filled 2013-01-13: qty 50

## 2013-01-13 MED ORDER — DOCUSATE SODIUM 100 MG PO CAPS
100.0000 mg | ORAL_CAPSULE | Freq: Two times a day (BID) | ORAL | Status: DC
  Administered 2013-01-14: 100 mg via ORAL
  Filled 2013-01-13 (×4): qty 1

## 2013-01-13 MED ORDER — SODIUM CHLORIDE 0.9 % IJ SOLN
3.0000 mL | INTRAMUSCULAR | Status: DC | PRN
  Administered 2013-01-13: 3 mL via INTRAVENOUS

## 2013-01-13 MED ORDER — SODIUM CHLORIDE 0.9 % IJ SOLN
3.0000 mL | Freq: Two times a day (BID) | INTRAMUSCULAR | Status: DC
  Administered 2013-01-15 – 2013-01-16 (×2): 3 mL via INTRAVENOUS

## 2013-01-13 MED ORDER — SODIUM CHLORIDE 0.9 % IV SOLN: 250.0000 mL | INTRAVENOUS | Status: DC | PRN

## 2013-01-13 MED ORDER — ALBUTEROL SULFATE (5 MG/ML) 0.5% IN NEBU
2.5000 mg | INHALATION_SOLUTION | RESPIRATORY_TRACT | Status: DC | PRN
  Administered 2013-01-14 (×2): 2.5 mg via RESPIRATORY_TRACT
  Filled 2013-01-13 (×3): qty 0.5

## 2013-01-13 MED ORDER — ALBUTEROL SULFATE (5 MG/ML) 0.5% IN NEBU
5.0000 mg | INHALATION_SOLUTION | Freq: Once | RESPIRATORY_TRACT | Status: AC
  Administered 2013-01-13: 5 mg via RESPIRATORY_TRACT
  Filled 2013-01-13: qty 1

## 2013-01-13 NOTE — ED Provider Notes (Signed)
History    CSN: [id number] Arrival date & time 01/13/13  1221  First MD Initiated Contact with Patient 01/13/13 1221     Chief Complaint  Patient presents with  . Fall   (Consider location/radiation/quality/duration/timing/severity/associated sxs/prior Treatment) Patient is a 79 y.o. female presenting with fall. The history is provided by the patient and the EMS personnel. No language interpreter was used.  Fall This is a new problem. The current episode started today. The problem occurs constantly. The problem has been unchanged. Associated symptoms include arthralgias and headaches. Pertinent negatives include no abdominal pain, fever, nausea, neck pain, numbness, urinary symptoms, visual change, vomiting or weakness. The symptoms are aggravated by walking and standing. She has tried nothing for the symptoms.    [name] is a 79 y.o. female  with a hx of CAD, CVA, asthma/COPD on oxygen at home presents to the Emergency Department complaining of acute, persistent, left hip pain after mechanical fall at home at approximately 6 AM. Patient husband reports that she got up off the couch where she sleeps and stumbled landing on her left hip.  He reports that she is not walking like normal and has been walking less since her fall. EMS reports no noted rotation or shortening of the left leg however patient has no pain with movement or with palpation to the area. EMS gave within normal 50 mcg IV which makes the pain better and movement and palpation makes the pain worse. Patient able to answer questions in yes/no fashion but is nonverbal beyond that. Patient reports associated headache denies chest pain, abdominal pain, increased shortness of breath, nausea, vomiting, right leg or hip pain.  Patient unable to answer whether or not she had a loss of consciousness.  Patient with history of asthma/COPD and has not had her epidural this morning.  Leval 5 caveat 2/2 to pts nonverbal status.    PCP:  [name], MD ([place], )   Past Medical History  Diagnosis Date  . Coronary artery disease   . CVA (cerebral vascular accident)   . GERD (gastroesophageal reflux disease)   . Hypertension   . Femoral neck fracture 12/2008    right  . Asthma   . Hyperlipidemia   . Breast cancer   . Cirrhosis of liver    Past Surgical History  Procedure Laterality Date  . Hemiarthroplasty hip    . Cardiac catheterization    . Bilateral mastectomy    . Abdominal hysterectomy    . Laparotomy  10/29/2011    Procedure: EXPLORATORY LAPAROTOMY;  Surgeon: Robyne Askew, MD;  Location: [facility] OR;  Service: General;  Laterality: N/A;  EXPLORATORY LAPAROTOMY, LYSIS OF ADHESIONS, PERITONEAL WASHINGS.   Family History  Problem Relation Age of Onset  . Heart attack Son     x 2   History  Substance Use Topics  . Smoking status: Never Smoker   . Smokeless tobacco: Never Used  . Alcohol Use: No   OB History   Grav Para Term Preterm Abortions TAB SAB Ect Mult Living                 Review of Systems  Unable to perform ROS: Patient nonverbal  Constitutional: Negative for fever.  HENT: Negative for neck pain and neck stiffness.   Gastrointestinal: Negative for nausea, vomiting and abdominal pain.  Musculoskeletal: Positive for arthralgias and gait problem. Negative for back pain.  Skin: Negative for color change and wound.  Neurological: Positive for headaches.  Negative for weakness and numbness.  Psychiatric/Behavioral: The patient is not nervous/anxious.     Allergies  Codeine  Home Medications   Current Outpatient Rx  Name  Route  Sig  Dispense  Refill  . diazepam (VALIUM) 5 MG tablet   Oral   Take 5 mg by mouth as needed.          . furosemide (LASIX) 40 MG tablet   Oral   Take 40 mg by mouth 4 (four) times daily. For 5 days         . furosemide (LASIX) 80 MG tablet   Oral   Take 80 mg by mouth daily.          . montelukast (SINGULAIR) 10 MG tablet   Oral   Take 10 mg by  mouth as needed.          . naproxen sodium (ANAPROX) 220 MG tablet   Oral   Take 440 mg by mouth 2 (two) times daily with a meal.         . nitroGLYCERIN (NITROSTAT) 0.4 MG SL tablet   Sublingual   Place 0.4 mg under the tongue every 5 (five) minutes as needed. Chest pain         . omeprazole (PRILOSEC) 20 MG capsule   Oral   Take 20 mg by mouth daily.          . potassium chloride (K-DUR) 10 MEQ tablet   Oral   Take 10 mEq by mouth 2 (two) times daily.          . VENTOLIN HFA 108 (90 BASE) MCG/ACT inhaler      2 puffs as needed.           BP 110/50  Pulse 84  Temp(Src) 97.8 F (36.6 C) (Oral)  Resp 16  SpO2 99% Physical Exam  Nursing note and vitals reviewed. Constitutional: She appears well-developed and well-nourished. No distress.  HENT:  Head: Normocephalic and atraumatic.  Right Ear: Tympanic membrane, external ear and ear canal normal.  Left Ear: Tympanic membrane, external ear and ear canal normal.  Nose: Nose normal.  Mouth/Throat: Uvula is midline and oropharynx is clear and moist. No edematous. No oropharyngeal exudate, posterior oropharyngeal edema, posterior oropharyngeal erythema or tonsillar abscesses.  Eyes: Conjunctivae and EOM are normal. Pupils are equal, round, and reactive to light. No scleral icterus.  Neck: Normal range of motion and full passive range of motion without pain. Neck supple. No spinous process tenderness and no muscular tenderness present. No rigidity. Normal range of motion present.  Cardiovascular: Normal rate, regular rhythm and intact distal pulses.   Pulses:      Radial pulses are 2+ on the right side, and 2+ on the left side.       Dorsalis pedis pulses are 2+ on the right side, and 2+ on the left side.  Capillary refill < 3 sec 2+ pitting edema extending from the toes to the ankle, but not above the ankle bilaterally  Pulmonary/Chest: Effort normal and breath sounds normal. No accessory muscle usage. Not  tachypneic. No respiratory distress. She has no decreased breath sounds. She has no wheezes. She has no rhonchi. She has no rales. She exhibits no tenderness and no bony tenderness.  Abdominal: Soft. Normal appearance and bowel sounds are normal. She exhibits no mass. There is no tenderness. There is no rigidity, no rebound and no guarding.  Musculoskeletal: She exhibits no edema.       Left  hip: She exhibits decreased range of motion, decreased strength, tenderness and bony tenderness. She exhibits no swelling, no crepitus, no deformity and no laceration.       Legs: Neurological: She is alert. GCS eye subscore is 4. GCS verbal subscore is 2. GCS motor subscore is 6.  Pt answers yes/no questions appropriately with grunts but is nonverbal beyond that (baseline per husband to EMS) Patient follows commands Sensation intact bilaterally Right sided facial droop noted (also baseline per husband to EMS)  Skin: Skin is warm and dry. She is not diaphoretic.  Psychiatric: She has a normal mood and affect.    ED Course  Procedures (including critical care time) Labs Reviewed  CBC - Abnormal; Notable for the following:    RBC 3.25 (*)    Hemoglobin 7.0 (*)    HCT 21.4 (*)    MCV 65.8 (*)    MCH 21.5 (*)    RDW 18.3 (*)    All other components within normal limits  BASIC METABOLIC PANEL - Abnormal; Notable for the following:    Glucose, Bld 109 (*)    Calcium 8.3 (*)    GFR calc non Af Amer 46 (*)    GFR calc Af Amer 53 (*)    All other components within normal limits  URINALYSIS, ROUTINE W REFLEX MICROSCOPIC - Abnormal; Notable for the following:    Leukocytes, UA MODERATE (*)    All other components within normal limits  URINE MICROSCOPIC-ADD ON - Abnormal; Notable for the following:    Squamous Epithelial / LPF FEW (*)    All other components within normal limits   Dg Hip Complete Left  01/13/2013   *RADIOLOGY REPORT*  Clinical Data: Fall.  Left hip pain.  LEFT HIP - COMPLETE 2+ VIEW   Comparison: 10/26/2011  Findings: Acute subcapital left femoral neck fracture noted. Regional bony pelvis intact.  Right hip hemiarthroplasty noted. Bony demineralization is present.  IMPRESSION: 1.  Acute subcapital left femoral neck fracture.   Original Report Authenticated By: Gaylyn Rong, M.D.   Ct Head Wo Contrast  01/13/2013   *RADIOLOGY REPORT*  Clinical Data:  Fall.  Headaches  CT HEAD WITHOUT CONTRAST CT CERVICAL SPINE WITHOUT CONTRAST  Technique:  Multidetector CT imaging of the head and cervical spine was performed following the standard protocol without intravenous contrast.  Multiplanar CT image reconstructions of the cervical spine were also generated.  Comparison:  12/31/2008  CT HEAD  Findings: There is no focal liver abnormalities identified.  Low attenuation foci within the bilateral basal ganglia compatible with chronic lacunar infarcts.  There is prominence of the sulci and ventricles consistent with brain atrophy. There is no evidence for acute brain infarct, hemorrhage or mass.  The paranasal sinuses are clear.  The mastoid air cells are clear. The skull appears intact.  IMPRESSION:  1.  No acute intracranial abnormalities. 2.  Small vessel ischemic change and brain atrophy.  CT CERVICAL SPINE  Findings: Normal alignment of the cervical spine.  The vertebral body heights are well preserved.  Multilevel disc space narrowing and ventral endplate spurring is noted consistent with degenerative disc disease.  There are low attenuation nodules identified within the left lobe of thyroid gland.  Calcified atherosclerotic disease is noted involving the carotid arteries.  IMPRESSION:  1.  No acute findings. 2.  Cervical spondylosis noted.   Original Report Authenticated By: Signa Kell, M.D.   Ct Cervical Spine Wo Contrast  01/13/2013   *RADIOLOGY REPORT*  Clinical Data:  Fall.  Headaches  CT HEAD WITHOUT CONTRAST CT CERVICAL SPINE WITHOUT CONTRAST  Technique:  Multidetector CT imaging of the  head and cervical spine was performed following the standard protocol without intravenous contrast.  Multiplanar CT image reconstructions of the cervical spine were also generated.  Comparison:  12/31/2008  CT HEAD  Findings: There is no focal liver abnormalities identified.  Low attenuation foci within the bilateral basal ganglia compatible with chronic lacunar infarcts.  There is prominence of the sulci and ventricles consistent with brain atrophy. There is no evidence for acute brain infarct, hemorrhage or mass.  The paranasal sinuses are clear.  The mastoid air cells are clear. The skull appears intact.  IMPRESSION:  1.  No acute intracranial abnormalities. 2.  Small vessel ischemic change and brain atrophy.  CT CERVICAL SPINE  Findings: Normal alignment of the cervical spine.  The vertebral body heights are well preserved.  Multilevel disc space narrowing and ventral endplate spurring is noted consistent with degenerative disc disease.  There are low attenuation nodules identified within the left lobe of thyroid gland.  Calcified atherosclerotic disease is noted involving the carotid arteries.  IMPRESSION:  1.  No acute findings. 2.  Cervical spondylosis noted.   Original Report Authenticated By: Signa Kell, M.D.   1. Hip fracture, left, closed, initial encounter   2. Anemia   3. Wheezing     MDM  Jasma Aniela Caniglia presents after fall with reported altered gait and increased pain complaints.  Pt with Hx of CVA and decreased verbal communication 2/2 to this.  Concern for L hip fracture as this was the point of impact and pt c/o pain here.  Will CT head/neck and x-ray L hip.  Basic labs with anticipation of admission vs surgical repair of possible hip fracture.  Increased pain control.    1:41 PM CT head/neck unremarkable without acute abnormality.  I personally reviewed the imaging tests through PACS system.  I reviewed available ER/hospitalization records through the EMR.  Pt saw [facility] for her  right hip fracture on 12/29/08.  Family has no preference for orthopedic; will consult on call ortho ([facility]) and proceed with medicine admission.    3:43 PM Pt now with wheezing and mild dyspnea; no hypoxia.  Husband states she did not get her morning albuterol treatment.  Will give neb now.  Pt with significant anemia of 7.0 not baseline for the patient. She does not take anticoagulants.  Fecal Occult results pending.  Pt pain controlled and resting comfortably.  Discussed with internal medicine who will admit.  Dr Shelle Iron recommended consult to [facility] as the pt was seen there for the right hip repair in 2010; pending call to [facility] for discussion of hip fx.    4:12 PM Dr Oletta Lamas discussed the patient with Dr Madelon Lips [facility] who has agreed to consult on the patient. He requests that the internal medicine team alert him when the pt is medically stable for surgery.  Pt admitted to medicine.    Dr. Quita Skye was consulted, evaluated this patient with me and agrees with the plan.     Dierdre Forth, PA-C 01/13/13 1616  Kiam Bransfield, PA-C 01/13/13 [phone]

## 2013-01-13 NOTE — H&P (Signed)
Date: 01/13/2013               Patient Name:  [name] MRN: [id number]  DOB: 02-Feb-1929 Age / Sex: 79 y.o., female   PCP: [name], MD         Medical Service: Internal Medicine Teaching Service         Attending Physician: Dr. Inez Catalina, MD    First Contact: Dr. Mikey Bussing Pager: [phone]  Second Contact: Dr. Everardo Beals Pager: [phone]       After Hours (After 5p/  First Contact Pager: [phone]  weekends / holidays): Second Contact Pager: [phone]   Chief Complaint: fall/left hip pain  History of Present Illness: [name] is a 79 year old female with PMH of CAD s/p stent placement in 1999 last echo in record was 2011 was 60%EF, peri cath CVA, Asthma, HTN, HLD, cirrhosis, right hip replacement, GI bleed, CKD,  who presented to [facility] by EMS after fall this morning.  She is accompanied by her husband who provides the majority of the history.  The husband reports that for the past 6 days she has been "groggy" and acting "fuzzy," in addition she has had trouble sleeping and slept little over the past few nights. He reports she saw here PCP on Wednesday who told her to increase her lasix dose from 80mg  daily to 160mg  daily for 5 days. And to increase here home O2 use from 2L to 3L.  On the night PTA he reports that she usually sleeps on the couch but that he gave her a milk shake and some ibuprofen and that she feel asleep in a recliner chair.  He decided to sleep next to her on the couch.  In the morning he reports he woke up to her out of the recliner trying to push the leg rest in from the front, he asked her what she was doing and at that time she fell on her left side.  He reports that she did not hit her head, or have any LOC.  He was concerned about her and called EMS.  When EMS arrived and tried to lift her to the stretcher she cried out and he realized she must have hurt her left hip.  In the ED hip Xrays showed an acute subcapital left femoral neck fracture.  No acute process  on Head or C-Spine CT.  In addition she was noted to be anemic with Hgb of 7, and decreased Cr from last record in April 2013.  Husband denies seeing any blood in her recent stools, but that dark stools are common for her.  She is not currently taking iron supplement.  She does have a follow up appointment scheduled with her PCP for Tuesday 7/8.    Meds: Medication Sig  . diazepam (VALIUM) 5 MG tablet Take 5 mg by mouth as needed.   . furosemide (LASIX) 40 MG tablet Take 40 mg by mouth 4 (four) times daily. For 5 days  . furosemide (LASIX) 80 MG tablet Take 80 mg by mouth daily.   . montelukast (SINGULAIR) 10 MG tablet Take 10 mg by mouth as needed.   . naproxen sodium (ANAPROX) 220 MG tablet Take 440 mg by mouth 2 (two) times daily with a meal.  . nitroGLYCERIN (NITROSTAT) 0.4 MG SL tablet Place 0.4 mg under the tongue every 5 (five) minutes as needed. Chest pain  . omeprazole (PRILOSEC) 20 MG capsule Take 20 mg by mouth daily.   Marland Kitchen  potassium chloride (K-DUR) 10 MEQ tablet Take 10 mEq by mouth 2 (two) times daily.   . VENTOLIN HFA 108 (90 BASE) MCG/ACT inhaler 2 puffs as needed.   Lasix QID started on 01/11/13 for planned 5d  Allergies: Allergies as of 01/13/2013 - Review Complete 01/13/2013  Allergen Reaction Noted  . Codeine  11/06/2009   Past Medical History  Diagnosis Date  . Coronary artery disease     s/p PCI in 1999 with peri-cath CVA, previous stress test  which showed ischemia in the inferior and inferolateral wall--> refused cardiac catheterization and has been treated medically though does not want to take statin or ASA  . CVA (cerebral vascular accident)   . GERD (gastroesophageal reflux disease)   . Hypertension   . Femoral neck fracture 12/2008    right  . Asthma   . Hyperlipidemia   . Breast cancer   . Cryptogenic cirrhosis     Dr. Arlyce Dice  . GI bleed Mar 2012    transfused 4 units PRBCs EGD showed bleed at duodenal bulb.  Plavix was D/C at this time.    Past  Surgical History  Procedure Laterality Date  . Hemiarthroplasty hip    . Cardiac catheterization    . Bilateral mastectomy    . Abdominal hysterectomy    . Laparotomy  10/29/2011    Procedure: EXPLORATORY LAPAROTOMY;  Surgeon: Robyne Askew, MD;  Location: [facility] OR;  Service: General;  Laterality: N/A;  EXPLORATORY LAPAROTOMY, LYSIS OF ADHESIONS, PERITONEAL WASHINGS.   Family History  Problem Relation Age of Onset  . Heart attack Son     x 2   History   Social History  . Marital Status: Married    Spouse Name: N/A    Number of Children: N/A  . Years of Education: N/A   Occupational History  . Not on file.   Social History Main Topics  . Smoking status: Never Smoker   . Smokeless tobacco: Never Used  . Alcohol Use: No  . Drug Use: No  . Sexually Active:    Other Topics Concern  . Not on file   Social History Narrative  . No narrative on file    Review of Systems: provided by husband Review of Systems  Constitutional: Negative for fever, chills and malaise/fatigue.  Respiratory: Positive for shortness of breath and wheezing. Negative for cough.   Cardiovascular: Negative for chest pain and leg swelling.  Gastrointestinal: Positive for constipation and melena. Negative for abdominal pain, diarrhea and blood in stool.  Musculoskeletal: Positive for joint pain and falls.  Neurological: Positive for weakness. Negative for dizziness, speech change, seizures and headaches.  Endo/Heme/Allergies: Bruises/bleeds easily.  Psychiatric/Behavioral: The patient has insomnia. The patient is not nervous/anxious.      Physical Exam: Blood pressure 126/55, pulse 89, temperature 97.8 F (36.6 C), temperature source Oral, resp. rate 18, SpO2 100.00%. Vitals Reviewed General: resting in bed, slow to speak, short answers HEENT: EOMI, no scleral icterus Cardiac: RRR, no rubs, murmurs or gallops Pulm: diffuse wheezing b/l Abd: soft, LLQ tenderness, nondistended, BS normoactive Ext:  warm and well perfused, 2+ pulses b/l, no pedal edema Neuro: alert and oriented to person, place, but not year (normal per husband), oriented to birth date.  No focal neurologic deficits noted   Lab results: Basic Metabolic Panel:  Recent Labs  40/98/11 1224  NA 135  K 3.9  CL 96  CO2 31  GLUCOSE 109*  BUN 21  CREATININE 1.10  CALCIUM  8.3*   CBC:  Recent Labs  01/13/13 1224  WBC 6.8  HGB 7.0*  HCT 21.4*  MCV 65.8*  PLT 165   Urinalysis:  Recent Labs  01/13/13 1409  COLORURINE YELLOW  LABSPEC 1.013  PHURINE 5.5  GLUCOSEU NEGATIVE  HGBUR NEGATIVE  BILIRUBINUR NEGATIVE  KETONESUR NEGATIVE  PROTEINUR NEGATIVE  UROBILINOGEN 0.2  NITRITE NEGATIVE  LEUKOCYTESUR MODERATE*    Imaging results:  Dg Hip Complete Left  01/13/2013   *RADIOLOGY REPORT*  Clinical Data: Fall.  Left hip pain.  LEFT HIP - COMPLETE 2+ VIEW  Comparison: 10/26/2011  Findings: Acute subcapital left femoral neck fracture noted. Regional bony pelvis intact.  Right hip hemiarthroplasty noted. Bony demineralization is present.  IMPRESSION: 1.  Acute subcapital left femoral neck fracture.   Original Report Authenticated By: Gaylyn Rong, M.D.   Ct Head Wo Contrast  01/13/2013   *RADIOLOGY REPORT*  Clinical Data:  Fall.  Headaches  CT HEAD WITHOUT CONTRAST CT CERVICAL SPINE WITHOUT CONTRAST  Technique:  Multidetector CT imaging of the head and cervical spine was performed following the standard protocol without intravenous contrast.  Multiplanar CT image reconstructions of the cervical spine were also generated.  Comparison:  12/31/2008  CT HEAD  Findings: There is no focal liver abnormalities identified.  Low attenuation foci within the bilateral basal ganglia compatible with chronic lacunar infarcts.  There is prominence of the sulci and ventricles consistent with brain atrophy. There is no evidence for acute brain infarct, hemorrhage or mass.  The paranasal sinuses are clear.  The mastoid air cells are  clear. The skull appears intact.  IMPRESSION:  1.  No acute intracranial abnormalities. 2.  Small vessel ischemic change and brain atrophy.  CT CERVICAL SPINE  Findings: Normal alignment of the cervical spine.  The vertebral body heights are well preserved.  Multilevel disc space narrowing and ventral endplate spurring is noted consistent with degenerative disc disease.  There are low attenuation nodules identified within the left lobe of thyroid gland.  Calcified atherosclerotic disease is noted involving the carotid arteries.  IMPRESSION:  1.  No acute findings. 2.  Cervical spondylosis noted.   Original Report Authenticated By: Signa Kell, M.D.   Ct Cervical Spine Wo Contrast  01/13/2013   *RADIOLOGY REPORT*  Clinical Data:  Fall.  Headaches  CT HEAD WITHOUT CONTRAST CT CERVICAL SPINE WITHOUT CONTRAST  Technique:  Multidetector CT imaging of the head and cervical spine was performed following the standard protocol without intravenous contrast.  Multiplanar CT image reconstructions of the cervical spine were also generated.  Comparison:  12/31/2008  CT HEAD  Findings: There is no focal liver abnormalities identified.  Low attenuation foci within the bilateral basal ganglia compatible with chronic lacunar infarcts.  There is prominence of the sulci and ventricles consistent with brain atrophy. There is no evidence for acute brain infarct, hemorrhage or mass.  The paranasal sinuses are clear.  The mastoid air cells are clear. The skull appears intact.  IMPRESSION:  1.  No acute intracranial abnormalities. 2.  Small vessel ischemic change and brain atrophy.  CT CERVICAL SPINE  Findings: Normal alignment of the cervical spine.  The vertebral body heights are well preserved.  Multilevel disc space narrowing and ventral endplate spurring is noted consistent with degenerative disc disease.  There are low attenuation nodules identified within the left lobe of thyroid gland.  Calcified atherosclerotic disease is noted  involving the carotid arteries.  IMPRESSION:  1.  No acute findings. 2.  Cervical  spondylosis noted.   Original Report Authenticated By: Signa Kell, M.D.    Assessment & Plan by Problem:    Fracture of femoral neck, left -Hip Xray show fracture of left femoral neck.  Right hip surgery done by Eulah Pont and Alisa Graff.  Spoke with Dr. Madelon Lips, Patient will be evaluated for surgery once medically stabilized. -Cardiac Risk Index in Noncardiac Surgey gives patient a score of 8 which correlates to a 7% risk, however we do not currently have patient full medical history and since patient has untreated heart disease this risk estimate likely underestimate true cardiac risk. -EKG ordered -Morphine 2mg  Q4PRN   CAD (coronary artery disease) -Patient with known coronary artery disease, 1 stent placed in 1999, but refused a second stent at a later time after the first catheretization was complicated by a CVA.  Patient has had chronic HLD since her youth and has declined therapy after failing to find a lipid lowering agent that was tolerated and also lowered her cholesterol significantly.  She has refused aspirin therapy. Her last echo on file was in 2011 when she had an EF of 60% with findings suggestive of inferior and inferolateral infarction with some perinfarction ischemia.  At this time we do not have her full records to determine her cardiac risk for surgery. -Continue home medications, monitor blood pressure. -Consult cardiology for surgical clearance -Monitor I&Os   Microcytic anemia -Patient has history of GI bleed from a sessile polyp in the duodenal bulb in 2012 where she was transfused 4 units of blood. -At this time we do not have full medical records and cannot determine if this an acute on chronic bleeding.  She does report a history of melena, current Hgb of 7.0, but Hemoccult was unable to be preformed due to lack of stool in rectal vault. -Will transfuse 2 units of PRBC to improve anemia  and prepare patient for surgery. -Recheck CBC after transfusion.   Cirrhosis of liver -Patient's records indicate she was diagnosed with cryptogenic cirrhosis in April of 2013 after being hospitalized for partial small bowel obstruction.   Asthma - Patient with history of Asthma, on home O2.  Never smoker.  Wheezing on exam. -Albuterol nebs Q4 -O2 by nasal cannula to maintain O2 sat >92%. -CXR   Chronic Kidney Disease Stage 2 -Patient has history of chronic kidney disease, renal function has decreased from sporadic readings which indicate CKD stage 2 with GFR ~80 to current bump in creatinine of 1.1 and GFR of 46.  This could be a progression of kidney disease to stage 3 or an acute on chronic kidney injury.  Patient has recently increased her dose of lasix, will hold back on lasix dose and monitor fluid status as well as trend creatinine.   Hypertension - Continue to monitor  Code Status: DNR/DNI  Dispo: Disposition is deferred at this time, awaiting improvement of current medical problems. Anticipated discharge in approximately 2-3 day(s).   The patient does have a current PCP [name], MD) and does not need an [facility] hospital follow-up appointment after discharge.  The patient does not have transportation limitations that hinder transportation to clinic appointments.  Signed: Carlynn Purl, DO 01/13/2013, 5:24 PM

## 2013-01-13 NOTE — Consult Note (Signed)
Reason for Consult:left displaced subcap hip fracture Referring Physician: [facility] ED  [name] is an 79 y.o. female.  History difficult to obtain pt seen alone in room no family present obtained through chart review and confirmation from pt with short answers.  HPI: 79yo female hx of CAD, CVA, asthma/COPD on home O2 (3L) sustained fall while standing from recliner this AM, unable to get up without assistance and was walking differently and less, EMS called by husband brought to [facility] ED where xrays revealed left supcapital femoral neck fracture displaced. She was admitted to medicine service per hip fracture protocol for medical and cardiac clearance for possible surgery.   Upon admission also found to be anemic   Past Medical History  Diagnosis Date  . Coronary artery disease     s/p PCI in 1999 with peri-cath CVA, previous stress test  which showed ischemia in the inferior and inferolateral wall--> refused cardiac catheterization and has been treated medically though does not want to take statin or ASA  . CVA (cerebral vascular accident)   . GERD (gastroesophageal reflux disease)   . Hypertension   . Femoral neck fracture 12/2008    right  . Asthma   . Hyperlipidemia   . Breast cancer   . Cryptogenic cirrhosis     Dr. Arlyce Dice  . GI bleed Mar 2012    transfused 4 units PRBCs EGD showed bleed at duodenal bulb.  Plavix was D/C at this time.     Past Surgical History  Procedure Laterality Date  . Hemiarthroplasty hip    . Cardiac catheterization    . Bilateral mastectomy    . Abdominal hysterectomy    . Laparotomy  10/29/2011    Procedure: EXPLORATORY LAPAROTOMY;  Surgeon: Robyne Askew, MD;  Location: [facility] OR;  Service: General;  Laterality: N/A;  EXPLORATORY LAPAROTOMY, LYSIS OF ADHESIONS, PERITONEAL WASHINGS.    Family History  Problem Relation Age of Onset  . Heart attack Son     x 2    Social History:  reports that she has never smoked. She has never used smokeless tobacco.  She reports that she does not drink alcohol or use illicit drugs.  Allergies:  Allergies  Allergen Reactions  . Codeine     REACTION: Any derevative ----  Becomes unresponsive    Medications: I have reviewed the patient's current medications.  Results for orders placed during the hospital encounter of 01/13/13 (from the past 48 hour(s))  CBC     Status: Abnormal   Collection Time    01/13/13 12:24 PM      Result Value Range   WBC 6.8  4.0 - 10.5 K/uL   RBC 3.25 (*) 3.87 - 5.11 MIL/uL   Hemoglobin 7.0 (*) 12.0 - 15.0 g/dL   Comment: REPEATED TO VERIFY   HCT 21.4 (*) 36.0 - 46.0 %   MCV 65.8 (*) 78.0 - 100.0 fL   MCH 21.5 (*) 26.0 - 34.0 pg   MCHC 32.7  30.0 - 36.0 g/dL   RDW 45.4 (*) 09.8 - 11.9 %   Platelets 165  150 - 400 K/uL  BASIC METABOLIC PANEL     Status: Abnormal   Collection Time    01/13/13 12:24 PM      Result Value Range   Sodium 135  135 - 145 mEq/L   Potassium 3.9  3.5 - 5.1 mEq/L   Chloride 96  96 - 112 mEq/L   CO2 31  19 - 32  mEq/L   Glucose, Bld 109 (*) 70 - 99 mg/dL   BUN 21  6 - 23 mg/dL   Creatinine, Ser 1.61  0.50 - 1.10 mg/dL   Calcium 8.3 (*) 8.4 - 10.5 mg/dL   GFR calc non Af Amer 46 (*) >90 mL/min   GFR calc Af Amer 53 (*) >90 mL/min   Comment:            The eGFR has been calculated     using the CKD EPI equation.     This calculation has not been     validated in all clinical     situations.     eGFR's persistently     <90 mL/min signify     possible Chronic Kidney Disease.  URINALYSIS, ROUTINE W REFLEX MICROSCOPIC     Status: Abnormal   Collection Time    01/13/13  2:09 PM      Result Value Range   Color, Urine YELLOW  YELLOW   APPearance CLEAR  CLEAR   Specific Gravity, Urine 1.013  1.005 - 1.030   pH 5.5  5.0 - 8.0   Glucose, UA NEGATIVE  NEGATIVE mg/dL   Hgb urine dipstick NEGATIVE  NEGATIVE   Bilirubin Urine NEGATIVE  NEGATIVE   Ketones, ur NEGATIVE  NEGATIVE mg/dL   Protein, ur NEGATIVE  NEGATIVE mg/dL   Urobilinogen, UA  0.2  0.0 - 1.0 mg/dL   Nitrite NEGATIVE  NEGATIVE   Leukocytes, UA MODERATE (*) NEGATIVE  URINE MICROSCOPIC-ADD ON     Status: Abnormal   Collection Time    01/13/13  2:09 PM      Result Value Range   Squamous Epithelial / LPF FEW (*) RARE   WBC, UA 3-6  <3 WBC/hpf  TYPE AND SCREEN     Status: None   Collection Time    01/13/13  5:04 PM      Result Value Range   ABO/RH(D) A POS     Antibody Screen NEG     Sample Expiration 01/16/2013     Unit Number [id number]     Blood Component Type RED CELLS,LR     Unit division 00     Status of Unit ALLOCATED     Transfusion Status OK TO TRANSFUSE     Crossmatch Result Compatible     Unit Number [id number]     Blood Component Type RED CELLS,LR     Unit division 00     Status of Unit ALLOCATED     Transfusion Status OK TO TRANSFUSE     Crossmatch Result Compatible    CBC     Status: Abnormal   Collection Time    01/13/13  5:06 PM      Result Value Range   WBC 6.5  4.0 - 10.5 K/uL   RBC 3.52 (*) 3.87 - 5.11 MIL/uL   Hemoglobin 7.6 (*) 12.0 - 15.0 g/dL   HCT 65.7 (*) 84.6 - 96.2 %   MCV 66.2 (*) 78.0 - 100.0 fL   MCH 21.6 (*) 26.0 - 34.0 pg   MCHC 32.6  30.0 - 36.0 g/dL   RDW 95.2 (*) 84.1 - 32.4 %   Platelets 163  150 - 400 K/uL  PREPARE RBC (CROSSMATCH)     Status: None   Collection Time    01/13/13  5:31 PM      Result Value Range   Order Confirmation ORDER PROCESSED BY BLOOD BANK      Dg Hip Complete  Left  01/13/2013   *RADIOLOGY REPORT*  Clinical Data: Fall.  Left hip pain.  LEFT HIP - COMPLETE 2+ VIEW  Comparison: 10/26/2011  Findings: Acute subcapital left femoral neck fracture noted. Regional bony pelvis intact.  Right hip hemiarthroplasty noted. Bony demineralization is present.  IMPRESSION: 1.  Acute subcapital left femoral neck fracture.   Original Report Authenticated By: Gaylyn Rong, M.D.   Ct Head Wo Contrast  01/13/2013   *RADIOLOGY REPORT*  Clinical Data:  Fall.  Headaches  CT HEAD WITHOUT CONTRAST CT  CERVICAL SPINE WITHOUT CONTRAST  Technique:  Multidetector CT imaging of the head and cervical spine was performed following the standard protocol without intravenous contrast.  Multiplanar CT image reconstructions of the cervical spine were also generated.  Comparison:  12/31/2008  CT HEAD  Findings: There is no focal liver abnormalities identified.  Low attenuation foci within the bilateral basal ganglia compatible with chronic lacunar infarcts.  There is prominence of the sulci and ventricles consistent with brain atrophy. There is no evidence for acute brain infarct, hemorrhage or mass.  The paranasal sinuses are clear.  The mastoid air cells are clear. The skull appears intact.  IMPRESSION:  1.  No acute intracranial abnormalities. 2.  Small vessel ischemic change and brain atrophy.  CT CERVICAL SPINE  Findings: Normal alignment of the cervical spine.  The vertebral body heights are well preserved.  Multilevel disc space narrowing and ventral endplate spurring is noted consistent with degenerative disc disease.  There are low attenuation nodules identified within the left lobe of thyroid gland.  Calcified atherosclerotic disease is noted involving the carotid arteries.  IMPRESSION:  1.  No acute findings. 2.  Cervical spondylosis noted.   Original Report Authenticated By: Signa Kell, M.D.   Ct Cervical Spine Wo Contrast  01/13/2013   *RADIOLOGY REPORT*  Clinical Data:  Fall.  Headaches  CT HEAD WITHOUT CONTRAST CT CERVICAL SPINE WITHOUT CONTRAST  Technique:  Multidetector CT imaging of the head and cervical spine was performed following the standard protocol without intravenous contrast.  Multiplanar CT image reconstructions of the cervical spine were also generated.  Comparison:  12/31/2008  CT HEAD  Findings: There is no focal liver abnormalities identified.  Low attenuation foci within the bilateral basal ganglia compatible with chronic lacunar infarcts.  There is prominence of the sulci and ventricles  consistent with brain atrophy. There is no evidence for acute brain infarct, hemorrhage or mass.  The paranasal sinuses are clear.  The mastoid air cells are clear. The skull appears intact.  IMPRESSION:  1.  No acute intracranial abnormalities. 2.  Small vessel ischemic change and brain atrophy.  CT CERVICAL SPINE  Findings: Normal alignment of the cervical spine.  The vertebral body heights are well preserved.  Multilevel disc space narrowing and ventral endplate spurring is noted consistent with degenerative disc disease.  There are low attenuation nodules identified within the left lobe of thyroid gland.  Calcified atherosclerotic disease is noted involving the carotid arteries.  IMPRESSION:  1.  No acute findings. 2.  Cervical spondylosis noted.   Original Report Authenticated By: Signa Kell, M.D.    Review of Systems  Constitutional: Negative for fever, chills and malaise/fatigue.  HENT: Positive for hearing loss. Negative for ear pain, nosebleeds and neck pain.   Eyes: Negative for pain.  Respiratory: Positive for shortness of breath and wheezing. Negative for cough and hemoptysis.   Cardiovascular: Positive for leg swelling. Negative for chest pain.  Gastrointestinal: Positive for constipation and  melena. Negative for heartburn, nausea, vomiting, abdominal pain and blood in stool.  Genitourinary: Negative for dysuria.  Musculoskeletal: Positive for joint pain and falls. Negative for back pain.  Skin: Negative for itching and rash.  Neurological: Positive for weakness. Negative for dizziness, speech change, seizures and headaches.  Endo/Heme/Allergies: Bruises/bleeds easily.  Psychiatric/Behavioral: The patient is nervous/anxious and has insomnia.    Blood pressure 114/39, pulse 91, temperature 97.8 F (36.6 C), temperature source Oral, resp. rate 18, SpO2 97.00%. Physical Exam  Constitutional: She appears cachectic. She is cooperative. No distress.  HENT:  Head: Normocephalic and  atraumatic.  Nose: Nose normal.  Eyes: Conjunctivae and EOM are normal.  Neck: Normal range of motion.  Cardiovascular: Normal rate, regular rhythm and normal heart sounds.   Respiratory: No accessory muscle usage. No respiratory distress. She has no decreased breath sounds. She has wheezes. She exhibits no tenderness.  GI: Soft. Bowel sounds are normal. She exhibits no distension. There is no tenderness.  Musculoskeletal:  Pt denies pain with passive motion of left hip but I do notice change in facial expression.  No tenderness over hip.  No deformity, no calf pain with palpation, she does have some bilat LE edema,   Lymphadenopathy:    She has no cervical adenopathy.  Neurological: She is alert. No cranial nerve deficit.  Oriented to person and place, not to day, month, or year.  Had difficulty remembering questions would sometimes stop in mid sentence showing some confusion.    Skin: Skin is warm and dry. No rash noted. No erythema. There is pallor.  Psychiatric: Her speech is delayed. She is slowed. Cognition and memory are impaired.    Assessment/Plan: Left subcapital femoral neck fracture   Risks and benefits of surgery (closed reduction perc pinning vs hemiarthroplasty discussed).  Obviously pt will need to be cleared from a medical and cardiac standpoint prior to surgery.  Will need blood transfusion due to current anemia.  DVT proph SCD's hold medication tonight for anemia of unknown cause reassess following transfusion, will likely need lovenox dose tomorrow per discussion with Dr. Madelon Lips.  Patient reports really no pain upon assessment would try to limit any pain medications if possible to prevent further confusion.  Margart Sickles 01/13/2013, 7:07 PM

## 2013-01-13 NOTE — ED Notes (Signed)
Patient presents from home via EMS, patient tripped over her recliner, fell forward and hit her and. Patient alert and oriented x 3, reporting leg and head pain.  No obvious deformity noted.

## 2013-01-13 NOTE — ED Provider Notes (Signed)
Medical screening examination/treatment/procedure(s) were conducted as a shared visit with non-physician practitioner(s) and myself.  I personally evaluated the patient during the encounter  Pt with mechanical fall at home, struck head, no LOC.  Will get head CT and observe in the ED.  No sig neck pain.  Will image c spine.  No complaints of neck pain.  Left hip pain, no sig deformity, will get plain films due to age.  Pt is not on coumadin.  Alert, baseline mentation, protecting airway.  No numbness or weakness to arms or legs. MM dry, wide PP on BP reading suggests pt is dehydrated.  Will hydrate here.   1:44 PM CTs show no sig acute injuries.  Plain films shows left femoral neck fracture.  Will discuss with unassigned medicine to admit and continue medical clearance and speak to unassigned ortho to consult on pt for hip fracture.  Spouse reports he is unsure who did surgery on right side, doesn't feel obligated to contact that group, instead to call unassigned group was fine to him.    Impression: Left hip fracture, acute Mild dehydration Mechanical fall    [name]. Chera Slivka, MD 01/13/13 [id number]

## 2013-01-13 NOTE — ED Notes (Signed)
Admitting MD at bedside.

## 2013-01-14 DIAGNOSIS — Z01810 Encounter for preprocedural cardiovascular examination: Secondary | ICD-10-CM

## 2013-01-14 DIAGNOSIS — E46 Unspecified protein-calorie malnutrition: Secondary | ICD-10-CM | POA: Diagnosis present

## 2013-01-14 DIAGNOSIS — I214 Non-ST elevation (NSTEMI) myocardial infarction: Secondary | ICD-10-CM

## 2013-01-14 DIAGNOSIS — N289 Disorder of kidney and ureter, unspecified: Secondary | ICD-10-CM | POA: Diagnosis present

## 2013-01-14 LAB — COMPREHENSIVE METABOLIC PANEL
Albumin: 3.1 g/dL — ABNORMAL LOW (ref 3.5–5.2)
Alkaline Phosphatase: 70 U/L (ref 39–117)
BUN: 23 mg/dL (ref 6–23)
CO2: 30 mEq/L (ref 19–32)
Chloride: 97 mEq/L (ref 96–112)
Creatinine, Ser: 1.12 mg/dL — ABNORMAL HIGH (ref 0.50–1.10)
GFR calc Af Amer: 52 mL/min — ABNORMAL LOW (ref >90)
GFR calc non Af Amer: 45 mL/min — ABNORMAL LOW (ref >90)
Glucose, Bld: 133 mg/dL — ABNORMAL HIGH (ref 70–99)
Potassium: 3.9 mEq/L (ref 3.5–5.1)
Total Bilirubin: 3.7 mg/dL — ABNORMAL HIGH (ref 0.3–1.2)

## 2013-01-14 LAB — CBC
HCT: 30.1 % — ABNORMAL LOW (ref 36.0–46.0)
Hemoglobin: 10 g/dL — ABNORMAL LOW (ref 12.0–15.0)
MCH: 23.1 pg — ABNORMAL LOW (ref 26.0–34.0)
MCHC: 33.2 g/dL (ref 30.0–36.0)
MCV: 69.5 fL — ABNORMAL LOW (ref 78.0–100.0)
Platelets: 164 10*3/uL (ref 150–400)
RBC: 4.33 MIL/uL (ref 3.87–5.11)
RDW: 18.2 % — ABNORMAL HIGH (ref 11.5–15.5)
WBC: 11.1 10*3/uL — ABNORMAL HIGH (ref 4.0–10.5)

## 2013-01-14 LAB — BLOOD GAS, ARTERIAL
Acid-Base Excess: 5.1 mmol/L — ABNORMAL HIGH (ref 0.0–2.0)
Drawn by: 33234
O2 Content: 4 L/min
O2 Saturation: 98.2 %
TCO2: 32.4 mmol/L (ref 0–100)

## 2013-01-14 LAB — APTT: aPTT: 36 seconds (ref 24–37)

## 2013-01-14 LAB — PROTIME-INR: INR: 1.51 — ABNORMAL HIGH (ref 0.00–1.49)

## 2013-01-14 LAB — COMPREHENSIVE METABOLIC PANEL WITH GFR
ALT: 11 U/L (ref 0–35)
AST: 23 U/L (ref 0–37)
Calcium: 8.5 mg/dL (ref 8.4–10.5)
Sodium: 137 meq/L (ref 135–145)
Total Protein: 6.6 g/dL (ref 6.0–8.3)

## 2013-01-14 MED ORDER — SODIUM CHLORIDE 0.9 % IV SOLN
INTRAVENOUS | Status: DC | PRN
  Administered 2013-01-14: 20:00:00 via INTRAVENOUS

## 2013-01-14 MED ORDER — ONDANSETRON HCL 4 MG/2ML IJ SOLN: 4.0000 mg | Freq: Once | INTRAMUSCULAR | Status: AC | PRN

## 2013-01-14 MED ORDER — ACETAMINOPHEN 10 MG/ML IV SOLN
1000.0000 mg | Freq: Once | INTRAVENOUS | Status: AC | PRN
  Filled 2013-01-14: qty 100

## 2013-01-14 MED ORDER — IPRATROPIUM BROMIDE 0.02 % IN SOLN
0.5000 mg | RESPIRATORY_TRACT | Status: DC
  Administered 2013-01-14 – 2013-01-15 (×7): 0.5 mg via RESPIRATORY_TRACT
  Filled 2013-01-14 (×7): qty 2.5

## 2013-01-14 MED ORDER — METOPROLOL TARTRATE 1 MG/ML IV SOLN
2.5000 mg | Freq: Four times a day (QID) | INTRAVENOUS | Status: DC
  Administered 2013-01-14 – 2013-01-15 (×2): 2.5 mg via INTRAVENOUS
  Filled 2013-01-14 (×13): qty 5

## 2013-01-14 MED ORDER — FUROSEMIDE 10 MG/ML IJ SOLN
INTRAMUSCULAR | Status: AC
  Filled 2013-01-14: qty 2

## 2013-01-14 MED ORDER — DEXTROSE 5 % IV SOLN
INTRAVENOUS | Status: DC | PRN
  Administered 2013-01-14: 20:00:00 via INTRAVENOUS

## 2013-01-14 MED ORDER — FENTANYL CITRATE 0.05 MG/ML IJ SOLN
INTRAMUSCULAR | Status: DC | PRN
  Administered 2013-01-14: 25 ug via INTRAVENOUS
  Administered 2013-01-15: 150 ug via INTRAVENOUS

## 2013-01-14 MED ORDER — CEFAZOLIN SODIUM-DEXTROSE 2-3 GM-% IV SOLR
INTRAVENOUS | Status: DC | PRN
  Administered 2013-01-14: 2 g via INTRAVENOUS

## 2013-01-14 MED ORDER — MORPHINE SULFATE 2 MG/ML IJ SOLN
1.0000 mg | INTRAMUSCULAR | Status: DC | PRN
  Administered 2013-01-14 (×2): 1 mg via INTRAVENOUS
  Filled 2013-01-14 (×2): qty 1

## 2013-01-14 MED ORDER — FUROSEMIDE 10 MG/ML IJ SOLN
20.0000 mg | Freq: Once | INTRAMUSCULAR | Status: AC
  Administered 2013-01-14: 20 mg via INTRAVENOUS
  Filled 2013-01-14: qty 2

## 2013-01-14 NOTE — Progress Notes (Signed)
Orthopedic Tech Progress Note Patient Details:  [name] [birth year] [id number] Visited patient in room and spoke with family. Patient not very mobile at all and is very weak. Patient is not appropriate for OHF w/ trapeze use. Patient ID: [name], female   DOB: 1929-02-22, 79 y.o.   MRN: [id number]   Orie Rout 01/14/2013, 1:07 PM

## 2013-01-14 NOTE — Progress Notes (Signed)
Subjective: Overnight patient was wheezing and had difficulty breathing.  Was seen by night team who added Atrovent to breathing treatments and increased O2 by [place].  Patient more sedated this morning.  Reports no difficulty breathing, denies chest pain, denies any hip or abdominal pain.  Denies palpitations. No Fever or chills. Husband is at bedside, he reports that she seems different from other hospitalizations, that normally she can get very active.  He also provides the additional history that [name] has been more depressed lately because most of her long time friends have passed away in recent years. Objective: Vital signs in last 24 hours: Filed Vitals:   01/14/13 0711 01/14/13 0749 01/14/13 0800 01/14/13 0817  BP: 117/50     Pulse: 97     Temp: 98 F (36.7 C)     TempSrc: Axillary     Resp: 18  20   Weight:    126 lb 12.8 oz (57.516 kg)  SpO2: 99% 98%     Weight change:   Intake/Output Summary (Last 24 hours) at 01/14/13 1020 Last data filed at 01/14/13 0500  Gross per 24 hour  Intake    475 ml  Output    450 ml  Net     25 ml   General: resting in bed HEENT: EOMI, no scleral icterus Cardiac: RRR, no rubs, murmurs or gallops Pulm: mild expiratory wheezing b/l. Abd: soft, nontender, nondistended, BS normoactive Ext: warm and well perfused, no pedal edema, no tenderness on palpation of left femur. Neuro: alert and oriented X2, cranial nerves II-XII grossly intact  Lab Results: Basic Metabolic Panel:  Recent Labs Lab 01/13/13 1224 01/14/13 0701  NA 135 137  K 3.9 3.9  CL 96 97  CO2 31 30  GLUCOSE 109* 133*  BUN 21 23  CREATININE 1.10 1.12*  CALCIUM 8.3* 8.5  MG  --  2.1   Liver Function Tests:  Recent Labs Lab 01/13/13 1926 01/14/13 0701  AST 22 23  ALT 10 11  ALKPHOS 64 70  BILITOT 0.6 3.7*  PROT 6.1 6.6  ALBUMIN 2.9* 3.1*   CBC:  Recent Labs Lab 01/13/13 1706 01/14/13 0701  WBC 6.5 11.1*  HGB 7.6* 10.0*  HCT 23.3* 30.1*  MCV 66.2*  69.5*  PLT 163 164   Cardiac Enzymes:  Recent Labs Lab 01/14/13 0700  TROPONINI 1.06*   Coagulation:  Recent Labs Lab 01/14/13 0700  LABPROT 17.8*  INR 1.51*   Urinalysis:  Recent Labs Lab 01/13/13 1409  COLORURINE YELLOW  LABSPEC 1.013  PHURINE 5.5  GLUCOSEU NEGATIVE  HGBUR NEGATIVE  BILIRUBINUR NEGATIVE  KETONESUR NEGATIVE  PROTEINUR NEGATIVE  UROBILINOGEN 0.2  NITRITE NEGATIVE  LEUKOCYTESUR MODERATE*   Misc. Labs: Peripheral blood smear shows target cells. LDH: 176  ABG    Component Value Date/Time   PHART 7.339* 01/14/2013 0551   PCO2ART 58.4* 01/14/2013 0551   PO2ART 103.0* 01/14/2013 0551   HCO3 30.6* 01/14/2013 0551   TCO2 32.4 01/14/2013 0551   O2SAT 98.2 01/14/2013 0551    Studies/Results: Dg Chest 1 View  01/13/2013   *RADIOLOGY REPORT*  Clinical Data: Fall, hip fracture, past history hypertension, asthma, coronary artery disease, breast cancer  CHEST - 1 VIEW  Comparison: 11/02/2011  Findings: Enlargement of cardiac silhouette. Atherosclerotic calcification aorta. Pulmonary vascular congestion. Changes of COPD with new infiltrate at right base. Minimal left basilar atelectasis. Central peribronchial thickening. Remaining lungs clear. No pleural effusion or pneumothorax. Diffuse osseous demineralization.  IMPRESSION: Enlargement of cardiac  silhouette. Emphysematous and bronchitic changes consistent with COPD. Mild left basilar atelectasis with right basilar consolidation most likely representing pneumonia.   Original Report Authenticated By: Ulyses Southward, M.D.   Dg Hip Complete Left  01/13/2013   *RADIOLOGY REPORT*  Clinical Data: Fall.  Left hip pain.  LEFT HIP - COMPLETE 2+ VIEW  Comparison: 10/26/2011  Findings: Acute subcapital left femoral neck fracture noted. Regional bony pelvis intact.  Right hip hemiarthroplasty noted. Bony demineralization is present.  IMPRESSION: 1.  Acute subcapital left femoral neck fracture.   Original Report Authenticated By: Gaylyn Rong, M.D.   Ct Head Wo Contrast  01/13/2013   *RADIOLOGY REPORT*  Clinical Data:  Fall.  Headaches  CT HEAD WITHOUT CONTRAST CT CERVICAL SPINE WITHOUT CONTRAST  Technique:  Multidetector CT imaging of the head and cervical spine was performed following the standard protocol without intravenous contrast.  Multiplanar CT image reconstructions of the cervical spine were also generated.  Comparison:  12/31/2008  CT HEAD  Findings: There is no focal liver abnormalities identified.  Low attenuation foci within the bilateral basal ganglia compatible with chronic lacunar infarcts.  There is prominence of the sulci and ventricles consistent with brain atrophy. There is no evidence for acute brain infarct, hemorrhage or mass.  The paranasal sinuses are clear.  The mastoid air cells are clear. The skull appears intact.  IMPRESSION:  1.  No acute intracranial abnormalities. 2.  Small vessel ischemic change and brain atrophy.  CT CERVICAL SPINE  Findings: Normal alignment of the cervical spine.  The vertebral body heights are well preserved.  Multilevel disc space narrowing and ventral endplate spurring is noted consistent with degenerative disc disease.  There are low attenuation nodules identified within the left lobe of thyroid gland.  Calcified atherosclerotic disease is noted involving the carotid arteries.  IMPRESSION:  1.  No acute findings. 2.  Cervical spondylosis noted.   Original Report Authenticated By: Signa Kell, M.D.   Ct Cervical Spine Wo Contrast  01/13/2013   *RADIOLOGY REPORT*  Clinical Data:  Fall.  Headaches  CT HEAD WITHOUT CONTRAST CT CERVICAL SPINE WITHOUT CONTRAST  Technique:  Multidetector CT imaging of the head and cervical spine was performed following the standard protocol without intravenous contrast.  Multiplanar CT image reconstructions of the cervical spine were also generated.  Comparison:  12/31/2008  CT HEAD  Findings: There is no focal liver abnormalities identified.  Low  attenuation foci within the bilateral basal ganglia compatible with chronic lacunar infarcts.  There is prominence of the sulci and ventricles consistent with brain atrophy. There is no evidence for acute brain infarct, hemorrhage or mass.  The paranasal sinuses are clear.  The mastoid air cells are clear. The skull appears intact.  IMPRESSION:  1.  No acute intracranial abnormalities. 2.  Small vessel ischemic change and brain atrophy.  CT CERVICAL SPINE  Findings: Normal alignment of the cervical spine.  The vertebral body heights are well preserved.  Multilevel disc space narrowing and ventral endplate spurring is noted consistent with degenerative disc disease.  There are low attenuation nodules identified within the left lobe of thyroid gland.  Calcified atherosclerotic disease is noted involving the carotid arteries.  IMPRESSION:  1.  No acute findings. 2.  Cervical spondylosis noted.   Original Report Authenticated By: Signa Kell, M.D.   Medications: I have reviewed the patient's current medications. Scheduled Meds: .  ceFAZolin (ANCEF) IV  2 g Intravenous On Call to OR  . docusate sodium  100  mg Oral BID  . furosemide      . ipratropium  0.5 mg Nebulization Q4H  . pantoprazole (PROTONIX) IV  40 mg Intravenous Q12H  . sodium chloride  3 mL Intravenous Q12H   Continuous Infusions: none PRN Meds:.sodium chloride, albuterol, morphine injection, sodium chloride Assessment/Plan: NSTEMI -Troponin 1st set positive on 7am blood draw.  EKG at 6am and at 9am showed some ST depression most obvious at lead V4.  Patient currently reports no chest pain.  With patients symptomatic anemia, the positive troponin most likely represents demand ischemia.  Patient was transfused last night and is we will trend troponins. -Will consult Cardiology Symptomatic microcytic anemia  -Transfuse 2 units of PRBC yesterday with improvement of Hgb from 7.6 to 10.0. - Hemoccult ordered - Peripheral smear showed  Polychromasia. LDH was 176 and Haptoglobin pending.  T. Bili elevated after transfusion, will recheck tomorrow.  Currently no signs of active bleeding.   -CBC and CMP in AM. CAD (coronary artery disease)  -Patient with known coronary artery disease, 1 stent placed in 1999, but refused a second stent at a later time after the first catheretization was complicated by a CVA. Patient has had chronic HLD since her youth and has declined therapy after failing to find a lipid lowering agent that was tolerated and also lowered her cholesterol significantly. She has refused aspirin therapy. Her last lexiscan on file was in 2011 when she had an EF of 60% with findings suggestive of inferior and inferolateral infarction with some perinfarction ischemia.  -Patient with possible demand ischemia from symptomatic anemia. -Continue home medications, monitor blood pressure.  -Monitor I&Os  Fracture of femoral neck, left  -Hip Xray show fracture of left femoral neck. Right hip surgery done by Eulah Pont and Alisa Graff. Spoke with Dr. Madelon Lips, Patient will be evaluated for surgery once medically stabilized.  -Will consult Cardiology to evaluate for positive troponin and cardiac risk for surgery -Decrease Morphine to 1mg  Q4PRN  -Will hold off on Lovenox for now. Cirrhosis of liver  -Patient's records indicate she was diagnosed with cryptogenic cirrhosis in April of 2013 after being hospitalized for partial small bowel obstruction.  Patient with findings from CXR that are consistent with COPD.  Patient's husband reports she never smoked.  Liver cirrhosis and COPD could be related to antitrypsin deficiency.  Asthma/COPD? - Patient with history of Asthma, on home O2. Never smoker. Wheezing on presentation, worse last night, added Atrovent to respiratory treatment. Breathing better this am no wheezing, O2 Sat 97% on 4L [place].   ABG drawn this AM showed pH 7.339 with pCO2 of 58.4, bicarb 30.6 and O2 of 103.  Showing that she is  oxygenating adequately. -Albuterol nebs Q4 Atrovent nebs Q4 -O2 by nasal cannula to maintain O2 sat >92%.  Chronic Kidney Disease Stage 2  -Patient has history of chronic kidney disease, renal function has decreased from sporadic readings which indicate CKD stage 2 with GFR ~80 to current bump in creatinine of 1.1 and GFR of 46. This could be a progression of kidney disease to stage 3 or an acute on chronic kidney injury. Patient has recently increased her dose of lasix, will hold back on lasix dose and monitor fluid status as well as trend creatinine. Patient's husband also reports patient was recently taking more naproxen than usual. -Creatinine continues to be elevated to 1.12 this morning.  Hypertension  - Continue to monitor   DVT prophylaxis: SCDs Code Status: DNR/DNI  Dispo: Disposition is deferred at this  time, awaiting improvement of current medical problems. Anticipated discharge in approximately 2-3 day(s).  The patient does have a current PCP [name], MD) and does not need an [facility] hospital follow-up appointment after discharge.  The patient does not have transportation limitations that hinder transportation to clinic appointments.   .Services Needed at time of discharge: Y = Yes, Blank = No PT:   OT:   RN:   Equipment:   Other:     LOS: 1 day   Carlynn Purl, DO 01/14/2013, 10:20 AM

## 2013-01-14 NOTE — Progress Notes (Signed)
Clinical Social Work Department BRIEF PSYCHOSOCIAL ASSESSMENT 01/14/2013  Patient:  [name], [name]     Account Number:  [id number]     Admit date:  01/13/2013  Clinical Social Worker:  Jacelyn Grip  Date/Time:  01/14/2013 10:00 AM  Referred by:  Physician  Date Referred:  01/14/2013 Referred for  SNF Placement   Other Referral:   Interview type:  Family Other interview type:    PSYCHOSOCIAL DATA Living Status:  HUSBAND Admitted from facility:   Level of care:   Primary support name:  [name] Primary support relationship to patient:  SPOUSE Degree of support available:   strong    CURRENT CONCERNS Current Concerns  Post-Acute Placement   Other Concerns:    SOCIAL WORK ASSESSMENT / PLAN CSW received referral for New SNF.    CSW reviewed chart and noted that pt is an 79 y.o. female admitted from home with spouse with left subcapital femoral neck fracture. Per chart, pt awaiting medical clearance for surgery.    CSW met with pt spouse in hallway. Pt experiencing confusion at this time. Pt spouse reports that pt had fall at home resulting in the hip fracture. Pt spouse confirmed that pt is awaiting medical clearance to have surgery. CSW discussed that given pt injury pt will require SNF placement at d/c. Pt husband expressed understanding, stating that pt had right hip fracture in the past and had to go to SNF for rehab at that time. Per pt husband, pt has been to [facility] in the past and that would be pt spouse preference for ST SNF placment from this admission.    CSW completed FL2 which will need to be updated following surgery and submitted clinicals to [facility].    Weekday CSW will follow up wth pt and pt spouse re: SNF d/c planning and send updated clinicials to [facility] if facility is able to offer pt a bed.    CSW to continue to follow.   Assessment/plan status:  Psychosocial Support/Ongoing Assessment of Needs Other assessment/ plan:    discharge planning   Information/referral to community resources:   Referral to [facility]    PATIENT'S/FAMILY'S RESPONSE TO PLAN OF CARE: Pt alert to person and place only at this time. Pt spouse is supportive and actively involved in pt care. Pt spouse familiar with process of SNF placement and would like for pt to go to [facility] upon discharge.     Jacklynn Lewis, MSW, [employer]  Clinical Social Work Weekend coverage [phone]

## 2013-01-14 NOTE — Anesthesia Preprocedure Evaluation (Addendum)
Anesthesia Evaluation  Patient identified by MRN, date of birth, ID band Patient awake    Reviewed: Allergy & Precautions, H&P , NPO status , Patient's Chart, lab work & pertinent test results  Airway Mallampati: II TM Distance: >3 FB     Dental  (+) Edentulous Upper and Edentulous Lower   Pulmonary shortness of breath and Long-Term Oxygen Therapy, asthma ,  breath sounds clear to auscultation        Cardiovascular hypertension, + CAD and + Past MI Rhythm:Regular Rate:Normal  Stent   Neuro/Psych CVA    GI/Hepatic GERD-  ,Hx: GI bleed with tarry stools   Endo/Other    Renal/GU Renal InsufficiencyRenal disease     Musculoskeletal   Abdominal   Peds  Hematology   Anesthesia Other Findings Bilateral eyes with yellow discharge R>L  Reproductive/Obstetrics                        Anesthesia Physical Anesthesia Plan  ASA: IV  Anesthesia Plan: General   Post-op Pain Management:    Induction: Intravenous  Airway Management Planned: Oral ETT  Additional Equipment:   Intra-op Plan:   Post-operative Plan: Extubation in OR  Informed Consent: I have reviewed the patients History and Physical, chart, labs and discussed the procedure including the risks, benefits and alternatives for the proposed anesthesia with the patient or authorized representative who has indicated his/her understanding and acceptance.     Plan Discussed with: CRNA and Anesthesiologist  Anesthesia Plan Comments: (L. Femoral Neck Fracture CAD S/P stent 1999 with peri-cath CVA elevated troponin possibly due to demand ischemia H/O GI Bleed Dementia cryptogenic cirrhosis H/O GIn bleed presented with anemia hgb 7.0 received PRBCs now 10.1/30.1  Kipp Brood, MD  Plan GA with LMA  Deitra Mayo, MD  )       Anesthesia Quick Evaluation

## 2013-01-14 NOTE — Consult Note (Signed)
ORTHOPAEDIC CONSULTATION  REQUESTING PHYSICIAN: Inez Catalina, MD  Chief Complaint: Left hip pain  HPI: [name] is a 79 y.o. female who complains of  left hip pain after a fall that apparently occurred either yesterday, or the day before. The patient is a very poor historian, and the family can't quite tell me when she fell. This was apparently a mechanical fall. She had a previous right hip fracture, which I did a hemiarthroplasty a couple of years ago, and has done well with that. She has had progressive deterioration in her overall medical condition however. Pain is currently rated as mild, but worse with movement. She has not been able to walk very well. She normally does walk, but is fairly limited around the house. Pain is located around the left groin. She denies loss of consciousness in the fall, but again she is not a very good historian.  Past Medical History  Diagnosis Date  . Coronary artery disease     s/p PCI in 1999 with peri-cath CVA, previous stress test  which showed ischemia in the inferior and inferolateral wall--> refused cardiac catheterization and has been treated medically though does not want to take statin or ASA  . CVA (cerebral vascular accident)   . GERD (gastroesophageal reflux disease)   . Hypertension   . Femoral neck fracture 12/2008    right  . Asthma   . Hyperlipidemia   . Breast cancer   . Cryptogenic cirrhosis     Dr. Arlyce Dice  . GI bleed Mar 2012    transfused 4 units PRBCs EGD showed bleed at duodenal bulb.  Plavix was D/C at this time.    Past Surgical History  Procedure Laterality Date  . Hemiarthroplasty hip    . Cardiac catheterization    . Bilateral mastectomy    . Abdominal hysterectomy    . Laparotomy  10/29/2011    Procedure: EXPLORATORY LAPAROTOMY;  Surgeon: Robyne Askew, MD;  Location: [facility] OR;  Service: General;  Laterality: N/A;  EXPLORATORY LAPAROTOMY, LYSIS OF ADHESIONS, PERITONEAL WASHINGS.   History   Social History   . Marital Status: Married    Spouse Name: N/A    Number of Children: N/A  . Years of Education: N/A   Social History Main Topics  . Smoking status: Never Smoker   . Smokeless tobacco: Never Used  . Alcohol Use: No  . Drug Use: No  . Sexually Active:    Other Topics Concern  . None   Social History Narrative  . None   Family History  Problem Relation Age of Onset  . Heart attack Son     x 2   Allergies  Allergen Reactions  . Codeine     REACTION: Any derevative ----  Becomes unresponsive   Prior to Admission medications   Medication Sig Start Date End Date Taking? Authorizing Provider  diazepam (VALIUM) 5 MG tablet Take 5 mg by mouth as needed.  08/21/12  Yes Historical Provider, MD  furosemide (LASIX) 40 MG tablet Take 40 mg by mouth 4 (four) times daily. For 5 days 01/10/13  Yes Historical Provider, MD  furosemide (LASIX) 80 MG tablet Take 80 mg by mouth daily.  11/23/11  Yes Historical Provider, MD  montelukast (SINGULAIR) 10 MG tablet Take 10 mg by mouth as needed.  08/21/12  Yes Historical Provider, MD  naproxen sodium (ANAPROX) 220 MG tablet Take 440 mg by mouth 2 (two) times daily with a meal.   Yes  Historical Provider, MD  nitroGLYCERIN (NITROSTAT) 0.4 MG SL tablet Place 0.4 mg under the tongue every 5 (five) minutes as needed. Chest pain 09/21/11  Yes Antonieta Iba, MD  omeprazole (PRILOSEC) 20 MG capsule Take 20 mg by mouth daily.    Yes Historical Provider, MD  potassium chloride (K-DUR) 10 MEQ tablet Take 10 mEq by mouth 2 (two) times daily.  08/21/12  Yes Historical Provider, MD  VENTOLIN HFA 108 (90 BASE) MCG/ACT inhaler 2 puffs as needed.  10/07/12  Yes Historical Provider, MD   Dg Chest 1 View  01/13/2013   *RADIOLOGY REPORT*  Clinical Data: Fall, hip fracture, past history hypertension, asthma, coronary artery disease, breast cancer  CHEST - 1 VIEW  Comparison: 11/02/2011  Findings: Enlargement of cardiac silhouette. Atherosclerotic calcification aorta. Pulmonary  vascular congestion. Changes of COPD with new infiltrate at right base. Minimal left basilar atelectasis. Central peribronchial thickening. Remaining lungs clear. No pleural effusion or pneumothorax. Diffuse osseous demineralization.  IMPRESSION: Enlargement of cardiac silhouette. Emphysematous and bronchitic changes consistent with COPD. Mild left basilar atelectasis with right basilar consolidation most likely representing pneumonia.   Original Report Authenticated By: Ulyses Southward, M.D.   Dg Hip Complete Left  01/13/2013   *RADIOLOGY REPORT*  Clinical Data: Fall.  Left hip pain.  LEFT HIP - COMPLETE 2+ VIEW  Comparison: 10/26/2011  Findings: Acute subcapital left femoral neck fracture noted. Regional bony pelvis intact.  Right hip hemiarthroplasty noted. Bony demineralization is present.  IMPRESSION: 1.  Acute subcapital left femoral neck fracture.   Original Report Authenticated By: Gaylyn Rong, M.D.   Ct Head Wo Contrast  01/13/2013   *RADIOLOGY REPORT*  Clinical Data:  Fall.  Headaches  CT HEAD WITHOUT CONTRAST CT CERVICAL SPINE WITHOUT CONTRAST  Technique:  Multidetector CT imaging of the head and cervical spine was performed following the standard protocol without intravenous contrast.  Multiplanar CT image reconstructions of the cervical spine were also generated.  Comparison:  12/31/2008  CT HEAD  Findings: There is no focal liver abnormalities identified.  Low attenuation foci within the bilateral basal ganglia compatible with chronic lacunar infarcts.  There is prominence of the sulci and ventricles consistent with brain atrophy. There is no evidence for acute brain infarct, hemorrhage or mass.  The paranasal sinuses are clear.  The mastoid air cells are clear. The skull appears intact.  IMPRESSION:  1.  No acute intracranial abnormalities. 2.  Small vessel ischemic change and brain atrophy.  CT CERVICAL SPINE  Findings: Normal alignment of the cervical spine.  The vertebral body heights are well  preserved.  Multilevel disc space narrowing and ventral endplate spurring is noted consistent with degenerative disc disease.  There are low attenuation nodules identified within the left lobe of thyroid gland.  Calcified atherosclerotic disease is noted involving the carotid arteries.  IMPRESSION:  1.  No acute findings. 2.  Cervical spondylosis noted.   Original Report Authenticated By: Signa Kell, M.D.   Ct Cervical Spine Wo Contrast  01/13/2013   *RADIOLOGY REPORT*  Clinical Data:  Fall.  Headaches  CT HEAD WITHOUT CONTRAST CT CERVICAL SPINE WITHOUT CONTRAST  Technique:  Multidetector CT imaging of the head and cervical spine was performed following the standard protocol without intravenous contrast.  Multiplanar CT image reconstructions of the cervical spine were also generated.  Comparison:  12/31/2008  CT HEAD  Findings: There is no focal liver abnormalities identified.  Low attenuation foci within the bilateral basal ganglia compatible with chronic lacunar infarcts.  There is prominence of the sulci and ventricles consistent with brain atrophy. There is no evidence for acute brain infarct, hemorrhage or mass.  The paranasal sinuses are clear.  The mastoid air cells are clear. The skull appears intact.  IMPRESSION:  1.  No acute intracranial abnormalities. 2.  Small vessel ischemic change and brain atrophy.  CT CERVICAL SPINE  Findings: Normal alignment of the cervical spine.  The vertebral body heights are well preserved.  Multilevel disc space narrowing and ventral endplate spurring is noted consistent with degenerative disc disease.  There are low attenuation nodules identified within the left lobe of thyroid gland.  Calcified atherosclerotic disease is noted involving the carotid arteries.  IMPRESSION:  1.  No acute findings. 2.  Cervical spondylosis noted.   Original Report Authenticated By: Signa Kell, M.D.    Positive ROS: All other systems have been reviewed and were otherwise negative with  the exception of those mentioned in the HPI and as above.  Physical Exam: General: Alert, no acute distress, although she does sit in bed, and interacts with some mild confusion.  Cardiovascular: No  significant pedal edema Respiratory: No cyanosis, mild use of accessory musculature  GI: No organomegaly, abdomen is soft and non-tender Skin: No lesions in the area of chief complaint Neurologic: Sensation intact distally Psychiatric:  patient interacts with me, but does not seem to understand everything fully. The entire family is at the bedside, and is helping to try and communicate.  Lymphatic: No axillary or cervical lymphadenopathy  MUSCULOSKELETAL:  left hip has positive log roll and pain over the greater trochanter. EHL and FHL are firing.   Assessment:  left valgus impacted femoral neck fracture in a severely complicated medical patient   Plan: There are multiple risk factors when considering surgery. She has now been transfused, and her anemia is improved. I have discussed her case with Dr. Patty Sermons, her cardiologist, who has indicated that she is probably as optimize that she could be. I discussed the options with the family including hemiarthroplasty versus surgical pinning, and they have elected for the minimally invasive approach in order to minimize medical comorbidities and complications.  The risks benefits and alternatives were discussed with the patient including but not limited to the risks of nonoperative treatment, versus surgical intervention including infection, bleeding, nerve injury, malunion, nonunion, the need for revision surgery, hardware prominence, hardware failure, the need for hardware removal, blood clots, cardiopulmonary complications, morbidity, mortality, among others, and they were willing to proceed.        [name], MD Cell [phone]   01/14/2013 11:52 AM

## 2013-01-14 NOTE — H&P (Signed)
I saw and evaluated the patient. I reviewed the resident's note and confirmed the resident's findings. I agree with the assessment and plan as documented in the resident's note.  Briefly, Ms. Molden is an 79yo woman with PMH of CAD s/p stent in 1999, peri cath CVA, asthma, HTN, HLD, cirrhosis, Rt hip replacement, CKD, h/o GI bleeding who presents after a fall.  Ms. Althoff is confused but awake when I saw her and most of the history was obtained from the husband.  Apparently for the last 3-6 days Ms. Birch has been more somnolent and groggy, but able to move around with her walker and interact.  She was seen by her PCP, who increased her lasix dose on Wednesday of this last week along with her home O2.  On the night prior to admission, she slept in a recliner, with her husband on the couch beside her.  In the morning, he noticed that she was up out of the recliner trying to push the leg rest down.  When he began speaking to her, she fell to her left side.  He seems to note that falling is not uncommon for her.  She began to experience discomfort on that side and EMS was called.  Of note, over the last 3 days or so, Ms. Vroom has been complaining of more pain and taking aleve twice a day.  This is abnormal for her per her husband.  Upon arrival to the ED, she was noted to have a hip fracture on the left.  She was further noted to have a Hgb of 7, last check in our system was 14.  Mr. Bensen reports recent dark stools, but no blood, but he notes this is "normal" for her.  She is currently only taking lasix, a reflux medication and occasional valium despite her extensive medical history.  From review, it appears that she did not want to take many medications.   On exam: She is comfortable, resting in bed.  She would answer yes, no questions and follow some commands.  Her cardiac exam was relatively normal. She had diffuse expiratory wheezing but no rales.  Her BS was NT, ND, with good BS, LE warm and without  edema.  Neuro: she is oriented to person, place.  No focal deficits on the neuro exam.   Lab: Cr 1.1, Hgb 7.0, MCV 65, UA with moderate LE. TnI 1.06  EKG with mild, ST depression in V4-V6  CT head/Cspine without acute findings.   Plan  1. Acute on chronic anemia - unclear what her baseline Hgb is, however, this appears to be somewhat acute in nature given recent history of NSAID use (? Overuse) and dark stools.  She does have a history of a GI bleed.  Blood transfusion pending.  Attempt to get hemocult.  CBC post transfusion  2. CAD with nonspecific EKG changes and elevated troponin - likely this is related to demand ischemia, given lack of symptoms of chest pain, SOB and low Hgb.  Trending CE, repeat EKG this afternoon.  She also has a history of CAD with stenting, but is not on therapy such as BB, ASA, plavix, statin, etc.  This could be due to a true ACS type picture.  Given her hip fracture, will have a low threshold to get Cardiology involved for any pre-operative procedure needs.   3. Wheezing/sob/asthma - would continue breathing treatments, short course of steroids.   4. Hip fracture: will likely need surgical correction pending improvement of above.  Other chronic issues include CKD and cryptogenic cirrhosis.  Please see resident note for further discussion and plan.   Signed Criselda Peaches, Tajae Rybicki

## 2013-01-14 NOTE — Progress Notes (Addendum)
Clinical Social Work Department CLINICAL SOCIAL WORK PLACEMENT NOTE 01/14/2013  Patient:  [name], [name]  Account Number:  [id number] Admit date:  01/13/2013  Clinical Social Worker:  Jacelyn Grip  Date/time:  01/14/2013 10:15 AM  Clinical Social Work is seeking post-discharge placement for this patient at the following level of care:   SKILLED NURSING   (*CSW will update this form in Epic as items are completed)   01/14/2013  Patient/family provided with [facility] Department of Clinical Social Work's list of facilities offering this level of care within the geographic area requested by the patient (or if unable, by the patient's family).  01/14/2013  Patient/family informed of their freedom to choose among providers that offer the needed level of care, that participate in Medicare, Medicaid or managed care program needed by the patient, have an available bed and are willing to accept the patient.  01/14/2013  Patient/family informed of [facility]' ownership interest in [facility], as well as of the fact that they are under no obligation to receive care at this facility.  PASARR submitted to EDS on 01/14/2013 PASARR number received from [employer] on 01/14/2013  FL2 transmitted to all facilities in geographic area requested by pt/family on  01/14/2013 FL2 transmitted to all facilities within larger geographic area on   Patient informed that his/her managed care company has contracts with or will negotiate with  certain facilities, including the following:     Patient/family informed of bed offers received:  01/16/13 Patient chooses bed at [facility] Physician recommends and patient chooses bed at    Patient to be transferred to  on  01/17/2013 Patient to be transferred to facility by [facility]  The following physician request were entered in Epic:   Additional Comments: pt spouse preference is [facility] and pt spouse agreeable to initiation of SNF search  to [facility].   Jacklynn Lewis, MSW, [employer]  Clinical Social Work Weekend coverage [phone]

## 2013-01-14 NOTE — Progress Notes (Signed)
Critical lab value for tropinin called to IM - Dr Mikey Bussing. No orders received. Continue to monitor patient.

## 2013-01-14 NOTE — Consult Note (Signed)
Reason for Consult:Pre-op clearance. Referring Physician: Dr. Criselda Peaches Primary Cardiologist: Dr. Dwyane Luo [name] is an 79 y.o. female.  HPI: This elderly WF was admitted after a mechanical fall at home. Presents with fracture of left hip.  The patient denies any recent chest pain.  She has a history of remote PCI in 1999.  Her cardiac catheterization complicated with a perioperative stroke.  She is followed monthly by her PCP Dr. [name].  Dr. Dossie Arbour is her cardiologist and saw her last on 10/11/12.  She has had a previous stress test which shows ischemia in the inferior and inferolateral wall but has refused cardiac catheterization and has been treated medically.  She is not on any antiplatelet agents.  She has a history of previous GI bleeds.  As admitted about a year ago and received 4 units of packed red blood cells and her EGD showed a bleed at the duodenal bulb.  Her records indicate that she is on naproxen 440 mg twice a day for arthritis.  Her family states that the patient always has black stools.  She is not taking any iron.  On this admission she was found to be severely anemic hemoglobin 7.0.  Patient takes occasional sublingual nitroglycerin. Past Medical History  Diagnosis Date  . Coronary artery disease     s/p PCI in 1999 with peri-cath CVA, previous stress test  which showed ischemia in the inferior and inferolateral wall--> refused cardiac catheterization and has been treated medically though does not want to take statin or ASA  . CVA (cerebral vascular accident)   . GERD (gastroesophageal reflux disease)   . Hypertension   . Femoral neck fracture 12/2008    right  . Asthma   . Hyperlipidemia   . Breast cancer   . Cryptogenic cirrhosis     Dr. Arlyce Dice  . GI bleed Mar 2012    transfused 4 units PRBCs EGD showed bleed at duodenal bulb.  Plavix was D/C at this time.     Past Surgical History  Procedure Laterality Date  . Hemiarthroplasty hip    . Cardiac  catheterization    . Bilateral mastectomy    . Abdominal hysterectomy    . Laparotomy  10/29/2011    Procedure: EXPLORATORY LAPAROTOMY;  Surgeon: Robyne Askew, MD;  Location: [facility] OR;  Service: General;  Laterality: N/A;  EXPLORATORY LAPAROTOMY, LYSIS OF ADHESIONS, PERITONEAL WASHINGS.    Family History  Problem Relation Age of Onset  . Heart attack Son     x 2    Social History:  reports that she has never smoked. She has never used smokeless tobacco. She reports that she does not drink alcohol or use illicit drugs.  Allergies:  Allergies  Allergen Reactions  . Codeine     REACTION: Any derevative ----  Becomes unresponsive    Medications:  Prior to Admission:  Prescriptions prior to admission  Medication Sig Dispense Refill  . diazepam (VALIUM) 5 MG tablet Take 5 mg by mouth as needed.       . furosemide (LASIX) 40 MG tablet Take 40 mg by mouth 4 (four) times daily. For 5 days      . furosemide (LASIX) 80 MG tablet Take 80 mg by mouth daily.       . montelukast (SINGULAIR) 10 MG tablet Take 10 mg by mouth as needed.       . naproxen sodium (ANAPROX) 220 MG tablet Take 440 mg by mouth 2 (two)  times daily with a meal.      . nitroGLYCERIN (NITROSTAT) 0.4 MG SL tablet Place 0.4 mg under the tongue every 5 (five) minutes as needed. Chest pain      . omeprazole (PRILOSEC) 20 MG capsule Take 20 mg by mouth daily.       . potassium chloride (K-DUR) 10 MEQ tablet Take 10 mEq by mouth 2 (two) times daily.       . VENTOLIN HFA 108 (90 BASE) MCG/ACT inhaler 2 puffs as needed.         Results for orders placed during the hospital encounter of 01/13/13 (from the past 48 hour(s))  CBC     Status: Abnormal   Collection Time    01/13/13 12:24 PM      Result Value Range   WBC 6.8  4.0 - 10.5 K/uL   RBC 3.25 (*) 3.87 - 5.11 MIL/uL   Hemoglobin 7.0 (*) 12.0 - 15.0 g/dL   Comment: REPEATED TO VERIFY   HCT 21.4 (*) 36.0 - 46.0 %   MCV 65.8 (*) 78.0 - 100.0 fL   MCH 21.5 (*) 26.0 - 34.0  pg   MCHC 32.7  30.0 - 36.0 g/dL   RDW 56.2 (*) 13.0 - 86.5 %   Platelets 165  150 - 400 K/uL  BASIC METABOLIC PANEL     Status: Abnormal   Collection Time    01/13/13 12:24 PM      Result Value Range   Sodium 135  135 - 145 mEq/L   Potassium 3.9  3.5 - 5.1 mEq/L   Chloride 96  96 - 112 mEq/L   CO2 31  19 - 32 mEq/L   Glucose, Bld 109 (*) 70 - 99 mg/dL   BUN 21  6 - 23 mg/dL   Creatinine, Ser 7.84  0.50 - 1.10 mg/dL   Calcium 8.3 (*) 8.4 - 10.5 mg/dL   GFR calc non Af Amer 46 (*) >90 mL/min   GFR calc Af Amer 53 (*) >90 mL/min   Comment:            The eGFR has been calculated     using the CKD EPI equation.     This calculation has not been     validated in all clinical     situations.     eGFR's persistently     <90 mL/min signify     possible Chronic Kidney Disease.  URINALYSIS, ROUTINE W REFLEX MICROSCOPIC     Status: Abnormal   Collection Time    01/13/13  2:09 PM      Result Value Range   Color, Urine YELLOW  YELLOW   APPearance CLEAR  CLEAR   Specific Gravity, Urine 1.013  1.005 - 1.030   pH 5.5  5.0 - 8.0   Glucose, UA NEGATIVE  NEGATIVE mg/dL   Hgb urine dipstick NEGATIVE  NEGATIVE   Bilirubin Urine NEGATIVE  NEGATIVE   Ketones, ur NEGATIVE  NEGATIVE mg/dL   Protein, ur NEGATIVE  NEGATIVE mg/dL   Urobilinogen, UA 0.2  0.0 - 1.0 mg/dL   Nitrite NEGATIVE  NEGATIVE   Leukocytes, UA MODERATE (*) NEGATIVE  URINE MICROSCOPIC-ADD ON     Status: Abnormal   Collection Time    01/13/13  2:09 PM      Result Value Range   Squamous Epithelial / LPF FEW (*) RARE   WBC, UA 3-6  <3 WBC/hpf  TYPE AND SCREEN     Status: None  Collection Time    01/13/13  5:04 PM      Result Value Range   ABO/RH(D) A POS     Antibody Screen NEG     Sample Expiration 01/16/2013     Unit Number [id number]     Blood Component Type RED CELLS,LR     Unit division 00     Status of Unit ISSUED,FINAL     Transfusion Status OK TO TRANSFUSE     Crossmatch Result Compatible     Unit  Number [id number]     Blood Component Type RED CELLS,LR     Unit division 00     Status of Unit ISSUED     Transfusion Status OK TO TRANSFUSE     Crossmatch Result Compatible    CBC     Status: Abnormal   Collection Time    01/13/13  5:06 PM      Result Value Range   WBC 6.5  4.0 - 10.5 K/uL   RBC 3.52 (*) 3.87 - 5.11 MIL/uL   Hemoglobin 7.6 (*) 12.0 - 15.0 g/dL   HCT 62.9 (*) 52.8 - 41.3 %   MCV 66.2 (*) 78.0 - 100.0 fL   MCH 21.6 (*) 26.0 - 34.0 pg   MCHC 32.6  30.0 - 36.0 g/dL   RDW 24.4 (*) 01.0 - 27.2 %   Platelets 163  150 - 400 K/uL  PREPARE RBC (CROSSMATCH)     Status: None   Collection Time    01/13/13  5:31 PM      Result Value Range   Order Confirmation ORDER PROCESSED BY BLOOD BANK    TECHNOLOGIST SMEAR REVIEW     Status: None   Collection Time    01/13/13  7:26 PM      Result Value Range   Tech Review POLYCHROMASIA PRESENT     Comment: TARGET CELLS  LACTATE DEHYDROGENASE     Status: None   Collection Time    01/13/13  7:26 PM      Result Value Range   LDH 176  94 - 250 U/L  HEPATIC FUNCTION PANEL     Status: Abnormal   Collection Time    01/13/13  7:26 PM      Result Value Range   Total Protein 6.1  6.0 - 8.3 g/dL   Albumin 2.9 (*) 3.5 - 5.2 g/dL   AST 22  0 - 37 U/L   ALT 10  0 - 35 U/L   Alkaline Phosphatase 64  39 - 117 U/L   Total Bilirubin 0.6  0.3 - 1.2 mg/dL   Bilirubin, Direct 0.2  0.0 - 0.3 mg/dL   Indirect Bilirubin 0.4  0.3 - 0.9 mg/dL  BLOOD GAS, ARTERIAL     Status: Abnormal   Collection Time    01/14/13  5:51 AM      Result Value Range   O2 Content 4.0     Delivery systems NASAL CANNULA     pH, Arterial 7.339 (*) 7.350 - 7.450   pCO2 arterial 58.4 (*) 35.0 - 45.0 mmHg   Comment: CRITICAL RESULT CALLED TO, READ BACK BY AND VERIFIED WITH:      HUI PENG RN AT 0557 BY LACY ALLEN RRT,RCP ON 01/14/2013   pO2, Arterial 103.0 (*) 80.0 - 100.0 mmHg   Bicarbonate 30.6 (*) 20.0 - 24.0 mEq/L   TCO2 32.4  0 - 100 mmol/L   Acid-Base Excess 5.1  (*) 0.0 - 2.0 mmol/L   O2  Saturation 98.2     Patient temperature 98.6     Collection site LEFT BRACHIAL     Drawn by [id number]     Sample type ARTERIAL DRAW     Allens test (pass/fail) PASS  PASS  APTT     Status: None   Collection Time    01/14/13  7:00 AM      Result Value Range   aPTT 36  24 - 37 seconds  PROTIME-INR     Status: Abnormal   Collection Time    01/14/13  7:00 AM      Result Value Range   Prothrombin Time 17.8 (*) 11.6 - 15.2 seconds   INR 1.51 (*) 0.00 - 1.49  TROPONIN I     Status: Abnormal   Collection Time    01/14/13  7:00 AM      Result Value Range   Troponin I 1.06 (*) <0.30 ng/mL   Comment:            Due to the release kinetics of cTnI,     a negative result within the first hours     of the onset of symptoms does not rule out     myocardial infarction with certainty.     If myocardial infarction is still suspected,     repeat the test at appropriate intervals.     CRITICAL RESULT CALLED TO, READ BACK BY AND VERIFIED WITH:     CASSELL M,RN 1000 01/14/13 SCALES H  COMPREHENSIVE METABOLIC PANEL     Status: Abnormal   Collection Time    01/14/13  7:01 AM      Result Value Range   Sodium 137  135 - 145 mEq/L   Potassium 3.9  3.5 - 5.1 mEq/L   Chloride 97  96 - 112 mEq/L   CO2 30  19 - 32 mEq/L   Glucose, Bld 133 (*) 70 - 99 mg/dL   BUN 23  6 - 23 mg/dL   Creatinine, Ser 9.14 (*) 0.50 - 1.10 mg/dL   Calcium 8.5  8.4 - 78.2 mg/dL   Total Protein 6.6  6.0 - 8.3 g/dL   Albumin 3.1 (*) 3.5 - 5.2 g/dL   AST 23  0 - 37 U/L   ALT 11  0 - 35 U/L   Alkaline Phosphatase 70  39 - 117 U/L   Total Bilirubin 3.7 (*) 0.3 - 1.2 mg/dL   GFR calc non Af Amer 45 (*) >90 mL/min   GFR calc Af Amer 52 (*) >90 mL/min   Comment:            The eGFR has been calculated     using the CKD EPI equation.     This calculation has not been     validated in all clinical     situations.     eGFR's persistently     <90 mL/min signify     possible Chronic Kidney Disease.  CBC      Status: Abnormal   Collection Time    01/14/13  7:01 AM      Result Value Range   WBC 11.1 (*) 4.0 - 10.5 K/uL   RBC 4.33  3.87 - 5.11 MIL/uL   Hemoglobin 10.0 (*) 12.0 - 15.0 g/dL   Comment: POST TRANSFUSION SPECIMEN   HCT 30.1 (*) 36.0 - 46.0 %   MCV 69.5 (*) 78.0 - 100.0 fL   MCH 23.1 (*) 26.0 - 34.0 pg   MCHC 33.2  30.0 - 36.0 g/dL   RDW 13.0 (*) 86.5 - 78.4 %   Platelets 164  150 - 400 K/uL  MAGNESIUM     Status: None   Collection Time    01/14/13  7:01 AM      Result Value Range   Magnesium 2.1  1.5 - 2.5 mg/dL    Dg Chest 1 View  12/18/6293   *RADIOLOGY REPORT*  Clinical Data: Fall, hip fracture, past history hypertension, asthma, coronary artery disease, breast cancer  CHEST - 1 VIEW  Comparison: 11/02/2011  Findings: Enlargement of cardiac silhouette. Atherosclerotic calcification aorta. Pulmonary vascular congestion. Changes of COPD with new infiltrate at right base. Minimal left basilar atelectasis. Central peribronchial thickening. Remaining lungs clear. No pleural effusion or pneumothorax. Diffuse osseous demineralization.  IMPRESSION: Enlargement of cardiac silhouette. Emphysematous and bronchitic changes consistent with COPD. Mild left basilar atelectasis with right basilar consolidation most likely representing pneumonia.   Original Report Authenticated By: Ulyses Southward, M.D.   Dg Hip Complete Left  01/13/2013   *RADIOLOGY REPORT*  Clinical Data: Fall.  Left hip pain.  LEFT HIP - COMPLETE 2+ VIEW  Comparison: 10/26/2011  Findings: Acute subcapital left femoral neck fracture noted. Regional bony pelvis intact.  Right hip hemiarthroplasty noted. Bony demineralization is present.  IMPRESSION: 1.  Acute subcapital left femoral neck fracture.   Original Report Authenticated By: Gaylyn Rong, M.D.   Ct Head Wo Contrast  01/13/2013   *RADIOLOGY REPORT*  Clinical Data:  Fall.  Headaches  CT HEAD WITHOUT CONTRAST CT CERVICAL SPINE WITHOUT CONTRAST  Technique:  Multidetector CT  imaging of the head and cervical spine was performed following the standard protocol without intravenous contrast.  Multiplanar CT image reconstructions of the cervical spine were also generated.  Comparison:  12/31/2008  CT HEAD  Findings: There is no focal liver abnormalities identified.  Low attenuation foci within the bilateral basal ganglia compatible with chronic lacunar infarcts.  There is prominence of the sulci and ventricles consistent with brain atrophy. There is no evidence for acute brain infarct, hemorrhage or mass.  The paranasal sinuses are clear.  The mastoid air cells are clear. The skull appears intact.  IMPRESSION:  1.  No acute intracranial abnormalities. 2.  Small vessel ischemic change and brain atrophy.  CT CERVICAL SPINE  Findings: Normal alignment of the cervical spine.  The vertebral body heights are well preserved.  Multilevel disc space narrowing and ventral endplate spurring is noted consistent with degenerative disc disease.  There are low attenuation nodules identified within the left lobe of thyroid gland.  Calcified atherosclerotic disease is noted involving the carotid arteries.  IMPRESSION:  1.  No acute findings. 2.  Cervical spondylosis noted.   Original Report Authenticated By: Signa Kell, M.D.   Ct Cervical Spine Wo Contrast  01/13/2013   *RADIOLOGY REPORT*  Clinical Data:  Fall.  Headaches  CT HEAD WITHOUT CONTRAST CT CERVICAL SPINE WITHOUT CONTRAST  Technique:  Multidetector CT imaging of the head and cervical spine was performed following the standard protocol without intravenous contrast.  Multiplanar CT image reconstructions of the cervical spine were also generated.  Comparison:  12/31/2008  CT HEAD  Findings: There is no focal liver abnormalities identified.  Low attenuation foci within the bilateral basal ganglia compatible with chronic lacunar infarcts.  There is prominence of the sulci and ventricles consistent with brain atrophy. There is no evidence for acute  brain infarct, hemorrhage or mass.  The paranasal sinuses are clear.  The mastoid air  cells are clear. The skull appears intact.  IMPRESSION:  1.  No acute intracranial abnormalities. 2.  Small vessel ischemic change and brain atrophy.  CT CERVICAL SPINE  Findings: Normal alignment of the cervical spine.  The vertebral body heights are well preserved.  Multilevel disc space narrowing and ventral endplate spurring is noted consistent with degenerative disc disease.  There are low attenuation nodules identified within the left lobe of thyroid gland.  Calcified atherosclerotic disease is noted involving the carotid arteries.  IMPRESSION:  1.  No acute findings. 2.  Cervical spondylosis noted.   Original Report Authenticated By: Signa Kell, M.D.    Review of systems negative except as in present illness.  Patient is very sedentary and walks with a walker short distances at home.  Blood pressure 117/50, pulse 97, temperature 98 F (36.7 C), temperature source Axillary, resp. rate 20, weight 126 lb 12.8 oz (57.516 kg), SpO2 98.00%. The patient appears to be in no distress.  Head and neck exam reveals that the pupils are equal and reactive.  The extraocular movements are full.  There is no scleral icterus.  Mouth and pharynx are benign.  No lymphadenopathy.  No carotid bruits.  The jugular venous pressure is normal.  Thyroid is not enlarged or tender.  Chest reveals scattered rhonchi at bases  Heart reveals no abnormal lift or heave.  First and second heart sounds are normal.  There is no murmur gallop rub or click.  The rhythm is regular.  The abdomen is soft and nontender.  Bowel sounds are normoactive.  There is no hepatosplenomegaly or mass.  There are no abdominal bruits.  Extremities reveal no phlebitis or edema.  Pedal pulses are good.  There is no cyanosis or clubbing.  Neurologic exam is normal strength and no lateralizing weakness.  No sensory deficits.  Integument reveals no rash  EKG  shows normal sinus rhythm with lateral ST segment depression consistent with left ventricular ischemia or strain. Chest x-ray shows mild cardiomegaly.  Possible atelectasis at right base.  Assessment/Plan: 1.  Known ischemic heart disease status post PCI in 1999 with precatheterization stroke.  Known reversible ischemia by previous stress tests and refused further evaluation.  No recent chest pain but does have elevated troponin consistent with type II non-STEMI secondary to demand ischemia from profound anemia and stress of hip fracture. 2. severe anemia with past history of bleeding duodenal ulcer and requiring 4 units of packed cell transfusion one year ago.  Chronic history of melena and has been on high-dose NSAIDs in the form of naproxen. 3. fractured left hip secondary to mechanical fall.  Plan: Monitor on telemetry. Add low-dose beta blocker.  No anticoagulation at this time because of possibility of GI bleed with profound anemia. Patient is at higher than average risk but not prohibitive risk and I would suggest proceeding with orthopedic surgery as planned.  Cassell Clement 01/14/2013, 11:53 AM

## 2013-01-15 ENCOUNTER — Encounter (HOSPITAL_COMMUNITY): Payer: Self-pay | Admitting: Certified Registered"

## 2013-01-15 ENCOUNTER — Encounter (HOSPITAL_COMMUNITY): Payer: Self-pay | Admitting: Certified Registered Nurse Anesthetist

## 2013-01-15 ENCOUNTER — Inpatient Hospital Stay (HOSPITAL_COMMUNITY): Payer: Medicare Other

## 2013-01-15 ENCOUNTER — Encounter (HOSPITAL_COMMUNITY)
Admission: EM | Disposition: A | Discharge status: Skilled Nursing Facility | Payer: Self-pay | Source: Home / Self Care | Attending: Internal Medicine

## 2013-01-15 ENCOUNTER — Inpatient Hospital Stay (HOSPITAL_COMMUNITY): Payer: Medicare Other | Admitting: Certified Registered Nurse Anesthetist

## 2013-01-15 DIAGNOSIS — D509 Iron deficiency anemia, unspecified: Secondary | ICD-10-CM

## 2013-01-15 DIAGNOSIS — E43 Unspecified severe protein-calorie malnutrition: Secondary | ICD-10-CM | POA: Diagnosis present

## 2013-01-15 DIAGNOSIS — I2510 Atherosclerotic heart disease of native coronary artery without angina pectoris: Secondary | ICD-10-CM

## 2013-01-15 HISTORY — PX: HIP PINNING,CANNULATED: SHX1758

## 2013-01-15 LAB — TROPONIN I: Troponin I: 0.93 ng/mL (ref <0.30)

## 2013-01-15 LAB — CBC
HCT: 28.3 % — ABNORMAL LOW (ref 36.0–46.0)
Platelets: 155 10*3/uL (ref 150–400)
RBC: 4.09 MIL/uL (ref 3.87–5.11)
RDW: 18.8 % — ABNORMAL HIGH (ref 11.5–15.5)
WBC: 9.9 10*3/uL (ref 4.0–10.5)

## 2013-01-15 LAB — COMPREHENSIVE METABOLIC PANEL
AST: 29 U/L (ref 0–37)
Albumin: 2.5 g/dL — ABNORMAL LOW (ref 3.5–5.2)
Alkaline Phosphatase: 59 U/L (ref 39–117)
BUN: 28 mg/dL — ABNORMAL HIGH (ref 6–23)
CO2: 29 mEq/L (ref 19–32)
Chloride: 97 mEq/L (ref 96–112)
GFR calc non Af Amer: 50 mL/min — ABNORMAL LOW (ref >90)
Potassium: 4 mEq/L (ref 3.5–5.1)
Total Bilirubin: 0.8 mg/dL (ref 0.3–1.2)

## 2013-01-15 LAB — TYPE AND SCREEN
Antibody Screen: NEGATIVE
Unit division: 0

## 2013-01-15 SURGERY — FIXATION, FEMUR, NECK, PERCUTANEOUS, USING SCREW
Anesthesia: General | Laterality: Left | Wound class: Clean

## 2013-01-15 MED ORDER — LACTATED RINGERS IV SOLN
INTRAVENOUS | Status: DC | PRN
  Administered 2013-01-15: 18:00:00 via INTRAVENOUS

## 2013-01-15 MED ORDER — PHENOL 1.4 % MT LIQD: 1.0000 | OROMUCOSAL | Status: DC | PRN

## 2013-01-15 MED ORDER — BISACODYL 10 MG RE SUPP: 10.0000 mg | Freq: Every day | RECTAL | Status: DC | PRN

## 2013-01-15 MED ORDER — METOCLOPRAMIDE HCL 5 MG/ML IJ SOLN: 5.0000 mg | Freq: Three times a day (TID) | INTRAMUSCULAR | Status: DC | PRN

## 2013-01-15 MED ORDER — PROPOFOL 10 MG/ML IV BOLUS
INTRAVENOUS | Status: DC | PRN
  Administered 2013-01-15: 80 mg via INTRAVENOUS

## 2013-01-15 MED ORDER — PHENYLEPHRINE HCL 10 MG/ML IJ SOLN
10.0000 mg | INTRAVENOUS | Status: DC | PRN
  Administered 2013-01-15: 60 ug/min via INTRAVENOUS

## 2013-01-15 MED ORDER — SODIUM CHLORIDE 0.9 % IV SOLN
INTRAVENOUS | Status: DC
  Administered 2013-01-15: 12:00:00 via INTRAVENOUS

## 2013-01-15 MED ORDER — ENOXAPARIN SODIUM 40 MG/0.4ML ~~LOC~~ SOLN: 40.0000 mg | SUBCUTANEOUS | Status: DC

## 2013-01-15 MED ORDER — POTASSIUM CHLORIDE IN NACL 20-0.45 MEQ/L-% IV SOLN
INTRAVENOUS | Status: DC
  Administered 2013-01-15: 22:00:00 via INTRAVENOUS
  Filled 2013-01-15 (×4): qty 1000

## 2013-01-15 MED ORDER — POLYETHYLENE GLYCOL 3350 17 G PO PACK: 17.0000 g | PACK | Freq: Every day | ORAL | Status: DC | PRN

## 2013-01-15 MED ORDER — HYDROCODONE-ACETAMINOPHEN 5-325 MG PO TABS: 1.0000 | ORAL_TABLET | Freq: Four times a day (QID) | ORAL | Status: AC | PRN

## 2013-01-15 MED ORDER — ENOXAPARIN SODIUM 40 MG/0.4ML ~~LOC~~ SOLN
40.0000 mg | SUBCUTANEOUS | Status: DC
  Administered 2013-01-16 – 2013-01-17 (×2): 40 mg via SUBCUTANEOUS
  Filled 2013-01-15 (×3): qty 0.4

## 2013-01-15 MED ORDER — ONDANSETRON HCL 4 MG PO TABS: 4.0000 mg | ORAL_TABLET | Freq: Four times a day (QID) | ORAL | Status: DC | PRN

## 2013-01-15 MED ORDER — CEFAZOLIN SODIUM-DEXTROSE 2-3 GM-% IV SOLR
2.0000 g | Freq: Four times a day (QID) | INTRAVENOUS | Status: AC
  Administered 2013-01-16 (×2): 2 g via INTRAVENOUS
  Filled 2013-01-15 (×2): qty 50

## 2013-01-15 MED ORDER — HYDROCODONE-ACETAMINOPHEN 5-325 MG PO TABS
1.0000 | ORAL_TABLET | Freq: Four times a day (QID) | ORAL | Status: DC | PRN
  Administered 2013-01-15 – 2013-01-16 (×2): 2 via ORAL
  Administered 2013-01-16 – 2013-01-17 (×3): 1 via ORAL
  Filled 2013-01-15 (×3): qty 1
  Filled 2013-01-15 (×2): qty 2

## 2013-01-15 MED ORDER — LIDOCAINE HCL (CARDIAC) 20 MG/ML IV SOLN
INTRAVENOUS | Status: DC | PRN
  Administered 2013-01-15: 100 mg via INTRAVENOUS

## 2013-01-15 MED ORDER — ACETAMINOPHEN 325 MG PO TABS: 650.0000 mg | ORAL_TABLET | Freq: Four times a day (QID) | ORAL | Status: DC | PRN

## 2013-01-15 MED ORDER — GLYCOPYRROLATE 0.2 MG/ML IJ SOLN
INTRAMUSCULAR | Status: DC | PRN
  Administered 2013-01-15: .6 mg via INTRAVENOUS

## 2013-01-15 MED ORDER — MENTHOL 3 MG MT LOZG: 1.0000 | LOZENGE | OROMUCOSAL | Status: DC | PRN

## 2013-01-15 MED ORDER — IPRATROPIUM BROMIDE 0.02 % IN SOLN
0.5000 mg | Freq: Three times a day (TID) | RESPIRATORY_TRACT | Status: DC
  Administered 2013-01-16: 0.5 mg via RESPIRATORY_TRACT
  Filled 2013-01-15: qty 2.5

## 2013-01-15 MED ORDER — METOCLOPRAMIDE HCL 10 MG PO TABS: 5.0000 mg | ORAL_TABLET | Freq: Three times a day (TID) | ORAL | Status: DC | PRN

## 2013-01-15 MED ORDER — SENNA 8.6 MG PO TABS
1.0000 | ORAL_TABLET | Freq: Two times a day (BID) | ORAL | Status: DC
  Administered 2013-01-16 – 2013-01-17 (×3): 8.6 mg via ORAL
  Filled 2013-01-15 (×5): qty 1

## 2013-01-15 MED ORDER — MORPHINE SULFATE 2 MG/ML IJ SOLN: 0.5000 mg | INTRAMUSCULAR | Status: DC | PRN

## 2013-01-15 MED ORDER — HYDROCERIN EX CREA
TOPICAL_CREAM | Freq: Two times a day (BID) | CUTANEOUS | Status: DC
  Administered 2013-01-15 – 2013-01-17 (×4): via TOPICAL
  Filled 2013-01-15: qty 113

## 2013-01-15 MED ORDER — CEFAZOLIN SODIUM-DEXTROSE 2-3 GM-% IV SOLR
2.0000 g | INTRAVENOUS | Status: AC
  Administered 2013-01-15: 2 g via INTRAVENOUS
  Filled 2013-01-15: qty 50

## 2013-01-15 MED ORDER — ONDANSETRON HCL 4 MG/2ML IJ SOLN
INTRAMUSCULAR | Status: DC | PRN
  Administered 2013-01-15: 4 mg via INTRAVENOUS

## 2013-01-15 MED ORDER — DOCUSATE SODIUM 100 MG PO CAPS
100.0000 mg | ORAL_CAPSULE | Freq: Two times a day (BID) | ORAL | Status: DC
  Administered 2013-01-15 – 2013-01-17 (×4): 100 mg via ORAL
  Filled 2013-01-15 (×4): qty 1

## 2013-01-15 MED ORDER — ROCURONIUM BROMIDE 100 MG/10ML IV SOLN
INTRAVENOUS | Status: DC | PRN
  Administered 2013-01-15: 50 mg via INTRAVENOUS

## 2013-01-15 MED ORDER — ACETAMINOPHEN 650 MG RE SUPP: 650.0000 mg | Freq: Four times a day (QID) | RECTAL | Status: DC | PRN

## 2013-01-15 MED ORDER — FLEET ENEMA 7-19 GM/118ML RE ENEM: 1.0000 | ENEMA | Freq: Once | RECTAL | Status: AC | PRN

## 2013-01-15 MED ORDER — ALBUTEROL SULFATE (5 MG/ML) 0.5% IN NEBU
2.5000 mg | INHALATION_SOLUTION | Freq: Three times a day (TID) | RESPIRATORY_TRACT | Status: DC
  Administered 2013-01-16: 2.5 mg via RESPIRATORY_TRACT
  Filled 2013-01-15: qty 0.5

## 2013-01-15 MED ORDER — NEOSTIGMINE METHYLSULFATE 1 MG/ML IJ SOLN
INTRAMUSCULAR | Status: DC | PRN
  Administered 2013-01-15: 3 mg via INTRAVENOUS

## 2013-01-15 MED ORDER — ONDANSETRON HCL 4 MG/2ML IJ SOLN: 4.0000 mg | Freq: Four times a day (QID) | INTRAMUSCULAR | Status: DC | PRN

## 2013-01-15 MED ORDER — ALUM & MAG HYDROXIDE-SIMETH 200-200-20 MG/5ML PO SUSP: 30.0000 mL | ORAL | Status: DC | PRN

## 2013-01-15 MED ORDER — SENNA-DOCUSATE SODIUM 8.6-50 MG PO TABS: 2.0000 | ORAL_TABLET | Freq: Every day | ORAL | Status: AC

## 2013-01-15 MED ORDER — 0.9 % SODIUM CHLORIDE (POUR BTL) OPTIME
TOPICAL | Status: DC | PRN
  Administered 2013-01-15: 1000 mL

## 2013-01-15 SURGICAL SUPPLY — 39 items
APL SKNCLS STERI-STRIP NONHPOA (GAUZE/BANDAGES/DRESSINGS)
BENZOIN TINCTURE PRP APPL 2/3 (GAUZE/BANDAGES/DRESSINGS) IMPLANT
BIT DRILL CANN LRG QC 5X300 (BIT) ×1 IMPLANT
BNDG COHESIVE 6X5 TAN STRL LF (GAUZE/BANDAGES/DRESSINGS) IMPLANT
BOOTCOVER CLEANROOM LRG (PROTECTIVE WEAR) ×2 IMPLANT
CLOTH BEACON ORANGE TIMEOUT ST (SAFETY) ×2 IMPLANT
COVER SURGICAL LIGHT HANDLE (MISCELLANEOUS) ×2 IMPLANT
DRAPE STERI IOBAN 125X83 (DRAPES) ×2 IMPLANT
DRSG MEPILEX BORDER 4X4 (GAUZE/BANDAGES/DRESSINGS) ×2 IMPLANT
DRSG PAD ABDOMINAL 8X10 ST (GAUZE/BANDAGES/DRESSINGS) IMPLANT
DURAPREP 26ML APPLICATOR (WOUND CARE) ×2 IMPLANT
ELECT REM PT RETURN 9FT ADLT (ELECTROSURGICAL) ×2
ELECTRODE REM PT RTRN 9FT ADLT (ELECTROSURGICAL) ×1 IMPLANT
GLOVE BIOGEL PI ORTHO PRO SZ8 (GLOVE) ×1
GLOVE ORTHO TXT STRL SZ7.5 (GLOVE) ×2 IMPLANT
GLOVE PI ORTHO PRO STRL SZ8 (GLOVE) ×1 IMPLANT
GLOVE SURG ORTHO 8.0 STRL STRW (GLOVE) ×4 IMPLANT
GOWN STRL NON-REIN LRG LVL3 (GOWN DISPOSABLE) IMPLANT
GUIDEWIRE THREADED 2.8 (WIRE) ×3 IMPLANT
KIT BASIN OR (CUSTOM PROCEDURE TRAY) ×2 IMPLANT
KIT ROOM TURNOVER OR (KITS) ×2 IMPLANT
MANIFOLD NEPTUNE II (INSTRUMENTS) ×1 IMPLANT
NEEDLE 22X1 1/2 (OR ONLY) (NEEDLE) ×1 IMPLANT
NS IRRIG 1000ML POUR BTL (IV SOLUTION) ×2 IMPLANT
PACK GENERAL/GYN (CUSTOM PROCEDURE TRAY) ×2 IMPLANT
PAD ARMBOARD 7.5X6 YLW CONV (MISCELLANEOUS) ×4 IMPLANT
SCREW CANN 16 THRD/85 7.3 (Screw) ×2 IMPLANT
SCREW CANN 16 THRD/90 7.3 (Screw) ×1 IMPLANT
STRIP CLOSURE SKIN 1/2X4 (GAUZE/BANDAGES/DRESSINGS) IMPLANT
SUT VIC AB 2-0 FS1 27 (SUTURE) ×1 IMPLANT
SUT VIC AB 2-0 SH 27 (SUTURE)
SUT VIC AB 2-0 SH 27XBRD (SUTURE) IMPLANT
SUT VIC AB 3-0 SH 27 (SUTURE)
SUT VIC AB 3-0 SH 27X BRD (SUTURE) IMPLANT
SUT VIC AB 3-0 SH 8-18 (SUTURE) ×1 IMPLANT
SYR CONTROL 10ML LL (SYRINGE) ×1 IMPLANT
TOWEL OR 17X24 6PK STRL BLUE (TOWEL DISPOSABLE) ×2 IMPLANT
TOWEL OR 17X26 10 PK STRL BLUE (TOWEL DISPOSABLE) ×2 IMPLANT
WATER STERILE IRR 1000ML POUR (IV SOLUTION) ×2 IMPLANT

## 2013-01-15 NOTE — Progress Notes (Signed)
Surgery unable to be completed yesterday, will plan for surgery later today. Her patient is resting comfortably in bed, not recurrent.  Surgery planned for this afternoon.  [name], MD

## 2013-01-15 NOTE — Progress Notes (Signed)
  Date: 01/15/2013  Patient name: [name]  Medical record number: [id number]  Date of birth: 1928-09-08   This patient has been seen and the plan of care was discussed with the house staff. Please see their note for complete details. I concur with their findings with the following additions/corrections:  Additional history obtained from husband and patient at bedside. She admits to recent swallowing difficulty and episodes of emesis. She has a chronic cough which has not changed per her husband.  CXR shows right basilar consolidation that in light of these findings is concerning for aspiration pneumonitis. Recommend Speech/swallow consultation to help determine her risk. Decreased PO intake and this aspiration episode may have led to her fall. In addition, her husband admits to a recent increase in PO diuretics, which may have contributed. She appears hypovolemic on exam.  I discussed in detail the current findings with patient and her husband. She has known ISCM with a hx of CVA during a catherization. She has had a NSTEMI likely due to demand ischemia. She has taken NSAIDS recently for pain (diffuse).  She is S/P PRBC transfusion. Her risk of perioperative complications is not low and I carefully explained risks of proceeding with surgery to repair a left hip fracture secondary to recent fall. Her husband states he understands and wishes to proceed. Cardiology has evaluated the patient.   [name], DO 01/15/2013, 2:47 PM

## 2013-01-15 NOTE — Anesthesia Procedure Notes (Signed)
Date/Time: 01/15/2013 6:00 PM Performed by: Coralee Rud Pre-anesthesia Checklist: Patient identified, Emergency Drugs available, Suction available and Patient being monitored Patient Re-evaluated:Patient Re-evaluated prior to inductionOxygen Delivery Method: Circle system utilized Preoxygenation: Pre-oxygenation with 100% oxygen Intubation Type: IV induction Ventilation: Mask ventilation without difficulty Laryngoscope Size: Miller and 3 Grade View: Grade I Tube type: Oral Tube size: 7.5 mm Number of attempts: 1 Airway Equipment and Method: Stylet,  LTA kit utilized and Bite block Placement Confirmation: ETT inserted through vocal cords under direct vision,  breath sounds checked- equal and bilateral and positive ETCO2 Secured at: 19 cm Tube secured with: Tape Dental Injury: Teeth and Oropharynx as per pre-operative assessment

## 2013-01-15 NOTE — Progress Notes (Signed)
Orthopedic Tech Progress Note Patient Details:  [name] 10/17/1928 [id number] Family refused ohf said patient was unable to use. Patient ID: [name], female   DOB: 12-10-1928, 79 y.o.   MRN: [id number]   [name] 01/15/2013, 10:00 PM

## 2013-01-15 NOTE — Transfer of Care (Signed)
Immediate Anesthesia Transfer of Care Note  Patient: [name]  Procedure(s) Performed: Procedure(s): CANNULATED HIP PINNING (Left)  Patient Location: PACU  Anesthesia Type:General  Level of Consciousness: unresponsive and Patient remains intubated per anesthesia plan  Airway & Oxygen Therapy: Patient placed on Ventilator (see vital sign flow sheet for setting)  Post-op Assessment: Report given to PACU RN  Post vital signs: Reviewed and stable  Complications: No apparent anesthesia complications and Pt. not reversible at end of surgical procedure; to PACU intubated

## 2013-01-15 NOTE — Preoperative (Addendum)
Beta Blockers   Reason not to administer Beta Blockers:2.5 mg Metoprolol IV at 1226 hrs on 01/15/13

## 2013-01-15 NOTE — Anesthesia Postprocedure Evaluation (Signed)
Anesthesia Post Note  Patient: [name]  Procedure(s) Performed: Procedure(s) (LRB): CANNULATED HIP PINNING (Left)  Anesthesia type: general  Patient location: PACU  Post pain: Pain level controlled  Post assessment: Patient's Cardiovascular Status Stable  Last Vitals:  Filed Vitals:   01/15/13 2056  BP: 128/40  Pulse: 79  Temp: 36.6 C  Resp: 18    Post vital signs: Reviewed and stable  Level of consciousness: sedated  Complications: No apparent anesthesia complications

## 2013-01-15 NOTE — Progress Notes (Signed)
Subjective: Patient was unable to go for Sx last night, rescheduled for today.  Patient appears more awake and alert today.  Denies any current pain.  Reports SOB has improved.  Nurse reports she has been scratching her legs.  Remains afebrile.  Husband at bedside. Objective: Vital signs in last 24 hours: Filed Vitals:   01/15/13 1155 01/15/13 1200 01/15/13 1225 01/15/13 1442  BP:   109/57 101/49  Pulse:   87 82  Temp:    98 F (36.7 C)  TempSrc:      Resp:  18  18  Weight:      SpO2: 97%   91%   Weight change:   Intake/Output Summary (Last 24 hours) at 01/15/13 1541 Last data filed at 01/15/13 1232  Gross per 24 hour  Intake    593 ml  Output    250 ml  Net    343 ml   General: resting in bed  HEENT: EOMI, no scleral icterus  Cardiac: RRR, no rubs, murmurs or gallops  Pulm: mild expiratory wheezing b/l.  Abd: soft, nontender, nondistended, BS normoactive  Ext: warm and well perfused, no pedal edema, tenderness to palpation of left hip. Erythemia and some excoriations noted on left leg. Neuro: alert and oriented X2, cranial nerves II-XII grossly intact  Lab Results: Basic Metabolic Panel:  Recent Labs Lab 01/14/13 0701 01/15/13 0010  NA 137 134*  K 3.9 4.0  CL 97 97  CO2 30 29  GLUCOSE 133* 136*  BUN 23 28*  CREATININE 1.12* 1.03  CALCIUM 8.5 8.2*  MG 2.1  --    Liver Function Tests:  Recent Labs Lab 01/14/13 0701 01/15/13 0010  AST 23 29  ALT 11 10  ALKPHOS 70 59  BILITOT 3.7* 0.8  PROT 6.6 6.0  ALBUMIN 3.1* 2.5*   CBC:  Recent Labs Lab 01/14/13 0701 01/15/13 0010  WBC 11.1* 9.9  HGB 10.0* 9.4*  HCT 30.1* 28.3*  MCV 69.5* 69.2*  PLT 164 155   Cardiac Enzymes:  Recent Labs Lab 01/14/13 0700 01/14/13 1235 01/15/13 0009  TROPONINI 1.06* 1.16* 0.93*   Coagulation:  Recent Labs Lab 01/14/13 0700  LABPROT 17.8*  INR 1.51*   Urinalysis:  Recent Labs Lab 01/13/13 1409  COLORURINE YELLOW  LABSPEC 1.013  PHURINE 5.5    GLUCOSEU NEGATIVE  HGBUR NEGATIVE  BILIRUBINUR NEGATIVE  KETONESUR NEGATIVE  PROTEINUR NEGATIVE  UROBILINOGEN 0.2  NITRITE NEGATIVE  LEUKOCYTESUR MODERATE*    Micro Results: Recent Results (from the past 240 hour(s))  MRSA PCR SCREENING     Status: None   Collection Time    01/14/13  6:06 PM      Result Value Range Status   MRSA by PCR NEGATIVE  NEGATIVE Final   Comment:            The GeneXpert MRSA Assay (FDA     approved for NASAL specimens     only), is one component of a     comprehensive MRSA colonization     surveillance program. It is not     intended to diagnose MRSA     infection nor to guide or     monitor treatment for     MRSA infections.   Studies/Results: Dg Chest 1 View  01/13/2013   *RADIOLOGY REPORT*  Clinical Data: Fall, hip fracture, past history hypertension, asthma, coronary artery disease, breast cancer  CHEST - 1 VIEW  Comparison: 11/02/2011  Findings: Enlargement of cardiac silhouette. Atherosclerotic calcification  aorta. Pulmonary vascular congestion. Changes of COPD with new infiltrate at right base. Minimal left basilar atelectasis. Central peribronchial thickening. Remaining lungs clear. No pleural effusion or pneumothorax. Diffuse osseous demineralization.  IMPRESSION: Enlargement of cardiac silhouette. Emphysematous and bronchitic changes consistent with COPD. Mild left basilar atelectasis with right basilar consolidation most likely representing pneumonia.   Original Report Authenticated By: Ulyses Southward, M.D.   Medications: I have reviewed the patient's current medications. Scheduled Meds: .  ceFAZolin (ANCEF) IV  2 g Intravenous 30 min Pre-Op  . docusate sodium  100 mg Oral BID  . ipratropium  0.5 mg Nebulization Q4H  . metoprolol  2.5 mg Intravenous Q6H  . pantoprazole (PROTONIX) IV  40 mg Intravenous Q12H  . sodium chloride  3 mL Intravenous Q12H   Continuous Infusions: . sodium chloride 50 mL/hr at 01/15/13 1224   PRN Meds:.sodium  chloride, albuterol, morphine injection, sodium chloride Assessment/Plan: NSTEMI Type II -Patient with positive troponins, Cardiology consulted. Denies any chest pain, likely due to demand ischemia from anemia. Fracture of femoral neck, left  -Hip Xray show fracture of left femoral neck. Right hip surgery done by Eulah Pont and Alisa Graff. Dr. Dion Saucier to take to OR today. -Discussed with patient and husband that surgery would not be low risk for her, cardiology has seen and reports that patient is at higher than average risk but not prohibitive and suggested proceeding with surgery. Symptomatic microcytic anemia  -Transfuse 2 units of PRBC on 7/5 with improvement of Hgb from 7.6 to 10.0. Hgb slightly decreased to 9.4 today.  - Hemoccult ordered  - Peripheral smear showed Polychromasia. LDH was 176 and Haptoglobin pending. T. Bili elevated after transfusion, now returned to normal. Currently no signs of active bleeding.  -CBC, BMet in AM. CAD (coronary artery disease)  -Patient with known coronary artery disease, 1 stent placed in 1999, but refused a second stent at a later time after the first catheretization was complicated by a CVA. Patient has had chronic HLD since her youth and has declined therapy after failing to find a lipid lowering agent that was tolerated and also lowered her cholesterol significantly. She has refused aspirin therapy. Her last lexiscan on file was in 2011 when she had an EF of 60% with findings suggestive of inferior and inferolateral infarction with some perinfarction ischemia.  -Continue home medications, monitor blood pressure.  -Monitor I&Os  Cirrhosis of liver  -Patient's records indicate she was diagnosed with cryptogenic cirrhosis in April of 2013 after being hospitalized for partial small bowel obstruction. Patient with findings from CXR that are consistent with COPD. Patient's husband reports she never smoked. Liver cirrhosis and COPD could be related to antitrypsin  deficiency.  Asthma/COPD?  - Patient with history of Asthma, findings on Xray suggest emphysematous changes.  Patient reports home use of O2.  Patiently likely has COPD. -Albuterol nebs Q4 Atrovent nebs Q4  -O2 by nasal cannula to maintain O2 sat >92%.  Chronic Kidney Disease Stage 2  -Patient has history of chronic kidney disease, renal function has decreased from sporadic readings which indicate CKD stage 2 with GFR ~80 to current bump in creatinine of 1.1 and GFR of 46. This could be a progression of kidney disease to stage 3 or an acute on chronic kidney injury. Patient has recently increased her dose of lasix, will hold back on lasix dose and monitor fluid status as well as trend creatinine. Patient's husband also reports patient was recently taking more naproxen than usual.  -Creatinine is  now trending down to 1.03 this morning.  Will continue to follow. Hypertension  - Continue to monitor  Dysphasia -Patient and husband reported history of trouble swallowing and about 3 episodes a week of emesis/coughing up her food.  CXR on admission showed possible RLL infiltrate which could represent aspiration pneumonitis. -SLP to eval and treat. Left lower leg rash -Not warm, not tender to palpation, does not appear to be cellulitis. -Eucerin cream BID.  DVT prophylaxis: SCDs  Code Status: DNR/DNI  Dispo: Disposition is deferred at this time, awaiting improvement of current medical problems. Anticipated discharge in approximately 2-3 day(s).  The patient does have a current PCP [name], MD) and does not need an [facility] hospital follow-up appointment after discharge.  The patient does not have transportation limitations that hinder transportation to clinic appointments.   .Services Needed at time of discharge: Y = Yes, Blank = No PT:   OT:   RN:   Equipment:   Other:     LOS: 2 days   Carlynn Purl, DO 01/15/2013, 3:41 PM

## 2013-01-15 NOTE — Progress Notes (Signed)
INITIAL NUTRITION ASSESSMENT  Pt meets criteria for SEVERE MALNUTRITION in the context of chronic illness as evidenced by severe fat and muscle wasting.  DOCUMENTATION CODES Per approved criteria  -Severe malnutrition in the context of chronic illness   INTERVENTION: Add Ensure Complete po BID, each supplement provides 350 kcal and 13 grams of protein.  NUTRITION DIAGNOSIS: Malnutrition related to chronic illness as evidenced by severe fat and muscle wasting.   Goal: Pt to meet >/= 90% of their estimated nutrition needs.   Monitor:  PO intake, weight trend, labs, supplement acceptance  Reason for Assessment: Hip Fx Protocol Consult  79 y.o. female  Admitting Dx: Fracture of femoral neck, left  ASSESSMENT: Pt admitted for hip fx after fall at home. Pt has been confused. Husband not at bedside currently. Pt states that she has not been eating much at home and has had weight loss but is not specific. Pt states that she does like chocolate ensure and tries to drink daily.  Plan for surgery today.   Nutrition Focused Physical Exam:  Subcutaneous Fat:  Orbital Region: WNL Upper Arm Region: WNL but with sagging skin likely reflective of weight loss. Thoracic and Lumbar Region: severe wasting  Muscle:  Temple Region: severe wasting Clavicle Bone Region: severe wasting Clavicle and Acromion Bone Region: severe wasting Scapular Bone Region: severe wasting Dorsal Hand: severe wasting Patellar Region: WNL Anterior Thigh Region: WNL Posterior Calf Region: WNL  Edema: not present    Height: Ht Readings from Last 1 Encounters:  10/11/12 5\' 3"  (1.6 m)    Weight: Wt Readings from Last 1 Encounters:  01/15/13 136 lb 6.4 oz (61.871 kg)    Ideal Body Weight: 52.2 kg  % Ideal Body Weight: 118%  Wt Readings from Last 10 Encounters:  01/15/13 136 lb 6.4 oz (61.871 kg)  01/15/13 136 lb 6.4 oz (61.871 kg)  10/11/12 121 lb 8 oz (55.112 kg)  03/21/12 112 lb 4 oz (50.916 kg)   12/08/11 107 lb (48.535 kg)  11/17/11 141 lb 12.8 oz (64.32 kg)  11/09/11 140 lb (63.504 kg)  11/03/11 140 lb 11.2 oz (63.821 kg)  11/03/11 140 lb 11.2 oz (63.821 kg)  09/21/11 135 lb (61.236 kg)    Usual Body Weight: unknown  % Usual Body Weight: -  BMI:  Body mass index is 24.17 kg/(m^2).  Estimated Nutritional Needs: Kcal: 1500-1700 Protein: 70-80 grams Fluid: > 1.5 L/day  Skin: no issues noted  Diet Order: NPO  EDUCATION NEEDS: -No education needs identified at this time   Intake/Output Summary (Last 24 hours) at 01/15/13 1104 Last data filed at 01/14/13 2300  Gross per 24 hour  Intake    590 ml  Output    250 ml  Net    340 ml    Last BM: 7/4   Labs:   Recent Labs Lab 01/13/13 1224 01/14/13 0701 01/15/13 0010  NA 135 137 134*  K 3.9 3.9 4.0  CL 96 97 97  CO2 31 30 29   BUN 21 23 28*  CREATININE 1.10 1.12* 1.03  CALCIUM 8.3* 8.5 8.2*  MG  --  2.1  --   GLUCOSE 109* 133* 136*    CBG (last 3)  No results found for this basename: GLUCAP,  in the last 72 hours  Scheduled Meds: . docusate sodium  100 mg Oral BID  . ipratropium  0.5 mg Nebulization Q4H  . metoprolol  2.5 mg Intravenous Q6H  . pantoprazole (PROTONIX) IV  40 mg  Intravenous Q12H  . sodium chloride  3 mL Intravenous Q12H    Continuous Infusions:   Past Medical History  Diagnosis Date  . Coronary artery disease     s/p PCI in 1999 with peri-cath CVA, previous stress test  which showed ischemia in the inferior and inferolateral wall--> refused cardiac catheterization and has been treated medically though does not want to take statin or ASA  . CVA (cerebral vascular accident)   . GERD (gastroesophageal reflux disease)   . Hypertension   . Femoral neck fracture 12/2008    right  . Asthma   . Hyperlipidemia   . Breast cancer   . Cryptogenic cirrhosis     Dr. Arlyce Dice  . GI bleed Mar 2012    transfused 4 units PRBCs EGD showed bleed at duodenal bulb.  Plavix was D/C at this time.      Past Surgical History  Procedure Laterality Date  . Hemiarthroplasty hip    . Cardiac catheterization    . Bilateral mastectomy    . Abdominal hysterectomy    . Laparotomy  10/29/2011    Procedure: EXPLORATORY LAPAROTOMY;  Surgeon: Robyne Askew, MD;  Location: [facility] OR;  Service: General;  Laterality: N/A;  EXPLORATORY LAPAROTOMY, LYSIS OF ADHESIONS, PERITONEAL WASHINGS.    Kendell Bane RD, LDN, CNSC [phone] Pager [phone] After Hours Pager'

## 2013-01-15 NOTE — OR Nursing (Signed)
Reversal given by bilotta crna after speaking with ossey mda

## 2013-01-15 NOTE — Progress Notes (Signed)
Pt brought back to room by pacu rn's,surgery will be tomorrow Dr Dion Saucier unavailable for surgery at this time..husband and family members notified.Husband at bedside. VSS,alert with extreme hoh with memory deficit ,diet given.02 2l [name]

## 2013-01-15 NOTE — Progress Notes (Signed)
Subjective:  Patient states she feels well. Denies chest pain or dyspnea. Rhythm NSR with occ PVCs.  Objective:  Vital Signs in the last 24 hours: Temp:  [97.5 F (36.4 C)-98.8 F (37.1 C)] 97.5 F (36.4 C) (07/07 0411) Pulse Rate:  [65-96] 86 (07/07 0818) Resp:  [16-20] 18 (07/07 0818) BP: (103-109)/(47-52) 103/52 mmHg (07/07 0411) SpO2:  [92 %-100 %] 97 % (07/07 0818) Weight:  [136 lb 6.4 oz (61.871 kg)] 136 lb 6.4 oz (61.871 kg) (07/07 0500)  Intake/Output from previous day: 07/06 0701 - 07/07 0700 In: 590 [P.O.:480; I.V.:110] Out: 500 [Urine:500] Intake/Output from this shift:    . docusate sodium  100 mg Oral BID  . ipratropium  0.5 mg Nebulization Q4H  . metoprolol  2.5 mg Intravenous Q6H  . pantoprazole (PROTONIX) IV  40 mg Intravenous Q12H  . sodium chloride  3 mL Intravenous Q12H      Physical Exam: The patient appears to be in no distress.  Head and neck exam reveals that the pupils are equal and reactive.  The extraocular movements are full.  There is no scleral icterus.  Mouth and pharynx are benign.  No lymphadenopathy.  No carotid bruits.  The jugular venous pressure is normal.  Thyroid is not enlarged or tender.  Chest mild bibasilar rhonchi.  Heart reveals no abnormal lift or heave.  First and second heart sounds are normal.  There is no murmur gallop rub or click.  The abdomen is soft and nontender.  Bowel sounds are normoactive.  There is no hepatosplenomegaly or mass.  There are no abdominal bruits.  Extremities reveal no phlebitis or edema.  Pedal pulses are good.  There is no cyanosis or clubbing. Left leg foreshortened.  Neurologic exam is normal strength and no lateralizing weakness.  No sensory deficits.  Integument reveals no rash  Lab Results:  Recent Labs  01/14/13 0701 01/15/13 0010  WBC 11.1* 9.9  HGB 10.0* 9.4*  PLT 164 155    Recent Labs  01/14/13 0701 01/15/13 0010  NA 137 134*  K 3.9 4.0  CL 97 97  CO2 30 29    GLUCOSE 133* 136*  BUN 23 28*  CREATININE 1.12* 1.03    Recent Labs  01/14/13 1235 01/15/13 0009  TROPONINI 1.16* 0.93*   Hepatic Function Panel  Recent Labs  01/13/13 1926  01/15/13 0010  PROT 6.1  < > 6.0  ALBUMIN 2.9*  < > 2.5*  AST 22  < > 29  ALT 10  < > 10  ALKPHOS 64  < > 59  BILITOT 0.6  < > 0.8  BILIDIR 0.2  --   --   IBILI 0.4  --   --   < > = values in this interval not displayed. No results found for this basename: CHOL,  in the last 72 hours No results found for this basename: PROTIME,  in the last 72 hours  Imaging: Dg Chest 1 View  01/13/2013   *RADIOLOGY REPORT*  Clinical Data: Fall, hip fracture, past history hypertension, asthma, coronary artery disease, breast cancer  CHEST - 1 VIEW  Comparison: 11/02/2011  Findings: Enlargement of cardiac silhouette. Atherosclerotic calcification aorta. Pulmonary vascular congestion. Changes of COPD with new infiltrate at right base. Minimal left basilar atelectasis. Central peribronchial thickening. Remaining lungs clear. No pleural effusion or pneumothorax. Diffuse osseous demineralization.  IMPRESSION: Enlargement of cardiac silhouette. Emphysematous and bronchitic changes consistent with COPD. Mild left basilar atelectasis with right basilar consolidation most  likely representing pneumonia.   Original Report Authenticated By: Ulyses Southward, M.D.   Dg Hip Complete Left  01/13/2013   *RADIOLOGY REPORT*  Clinical Data: Fall.  Left hip pain.  LEFT HIP - COMPLETE 2+ VIEW  Comparison: 10/26/2011  Findings: Acute subcapital left femoral neck fracture noted. Regional bony pelvis intact.  Right hip hemiarthroplasty noted. Bony demineralization is present.  IMPRESSION: 1.  Acute subcapital left femoral neck fracture.   Original Report Authenticated By: Gaylyn Rong, M.D.   Ct Head Wo Contrast  01/13/2013   *RADIOLOGY REPORT*  Clinical Data:  Fall.  Headaches  CT HEAD WITHOUT CONTRAST CT CERVICAL SPINE WITHOUT CONTRAST  Technique:   Multidetector CT imaging of the head and cervical spine was performed following the standard protocol without intravenous contrast.  Multiplanar CT image reconstructions of the cervical spine were also generated.  Comparison:  12/31/2008  CT HEAD  Findings: There is no focal liver abnormalities identified.  Low attenuation foci within the bilateral basal ganglia compatible with chronic lacunar infarcts.  There is prominence of the sulci and ventricles consistent with brain atrophy. There is no evidence for acute brain infarct, hemorrhage or mass.  The paranasal sinuses are clear.  The mastoid air cells are clear. The skull appears intact.  IMPRESSION:  1.  No acute intracranial abnormalities. 2.  Small vessel ischemic change and brain atrophy.  CT CERVICAL SPINE  Findings: Normal alignment of the cervical spine.  The vertebral body heights are well preserved.  Multilevel disc space narrowing and ventral endplate spurring is noted consistent with degenerative disc disease.  There are low attenuation nodules identified within the left lobe of thyroid gland.  Calcified atherosclerotic disease is noted involving the carotid arteries.  IMPRESSION:  1.  No acute findings. 2.  Cervical spondylosis noted.   Original Report Authenticated By: Signa Kell, M.D.   Ct Cervical Spine Wo Contrast  01/13/2013   *RADIOLOGY REPORT*  Clinical Data:  Fall.  Headaches  CT HEAD WITHOUT CONTRAST CT CERVICAL SPINE WITHOUT CONTRAST  Technique:  Multidetector CT imaging of the head and cervical spine was performed following the standard protocol without intravenous contrast.  Multiplanar CT image reconstructions of the cervical spine were also generated.  Comparison:  12/31/2008  CT HEAD  Findings: There is no focal liver abnormalities identified.  Low attenuation foci within the bilateral basal ganglia compatible with chronic lacunar infarcts.  There is prominence of the sulci and ventricles consistent with brain atrophy. There is no  evidence for acute brain infarct, hemorrhage or mass.  The paranasal sinuses are clear.  The mastoid air cells are clear. The skull appears intact.  IMPRESSION:  1.  No acute intracranial abnormalities. 2.  Small vessel ischemic change and brain atrophy.  CT CERVICAL SPINE  Findings: Normal alignment of the cervical spine.  The vertebral body heights are well preserved.  Multilevel disc space narrowing and ventral endplate spurring is noted consistent with degenerative disc disease.  There are low attenuation nodules identified within the left lobe of thyroid gland.  Calcified atherosclerotic disease is noted involving the carotid arteries.  IMPRESSION:  1.  No acute findings. 2.  Cervical spondylosis noted.   Original Report Authenticated By: Signa Kell, M.D.    Cardiac Studies: Telemetry shows NSR. Assessment/Plan:  1. Known ischemic heart disease status post PCI in 1999 with precatheterization stroke. Known reversible ischemia by previous stress tests and refused further evaluation. No recent chest pain but does have elevated troponin consistent with type II non-STEMI  secondary to demand ischemia from profound anemia and stress of hip fracture. Troponins trending down. 2. severe anemia with past history of bleeding duodenal ulcer and requiring 4 units of packed cell transfusion one year ago. Chronic history of melena and has been on high-dose NSAIDs in the form of naproxen. Hgb 9.4 this am 3. fractured left hip secondary to mechanical fall.  Plan: Continue BB. Okay for left hip surgery today.   LOS: 2 days    Cassell Clement 01/15/2013, 9:29 AM

## 2013-01-15 NOTE — Op Note (Signed)
01/15/2013  7:00 PM  PATIENT:  [name]    PRE-OPERATIVE DIAGNOSIS:  left femoral neck fracture  POST-OPERATIVE DIAGNOSIS:  Same  PROCEDURE:  CANNULATED HIP PINNING  SURGEON:  [name], MD  PHYSICIAN ASSISTANT: Janace Litten, OPA-C, present and scrubbed throughout the case, critical for completion in a timely fashion, and for retraction, instrumentation, and closure.  ANESTHESIA:   General  PREOPERATIVE INDICATIONS:  [name] is a  79 y.o. female who fell and was found to have a diagnosis of left femoral neck fracture who elected for surgical management.    The risks benefits and alternatives were discussed with the patient preoperatively including but not limited to the risks of infection, bleeding, nerve injury, cardiopulmonary complications, blood clots, malunion, nonunion, avascular necrosis, the need for revision surgery, the potential for conversion to hemiarthroplasty, among others, and the patient was willing to proceed.  OPERATIVE IMPLANTS: 7.3 mm cannulated screws x3  OPERATIVE FINDINGS: Clinical osteoporosis with weak bone, proximal femur  OPERATIVE PROCEDURE: The patient was brought to the operating room and placed in supine position. IV antibiotics were given. General anesthesia administered. Foley was also given. The patient was placed on the fracture table. The operative extremity was positioned, without any significant reduction maneuver and was prepped and draped in usual sterile fashion.  Time out was performed.  Small incision was made distal to the greater trochanter, and 3 guidewires were introduced Into an inverted triangle configuration. The lengths were measured. The reduction was slightly valgus, and near-anatomic. I opened the cortex with a cannulated drill, and then placed the screws into position. Satisfactory fixation was achieved.  The wounds were irrigated copiously, and repaired with Vicryl with Steri-Strips and sterile gauze. There  no complications and the patient tolerated the procedure well.  The patient will be weightbearing as tolerated, and will be on Lovenox postoperatively for a period of 2 weeks after discharge for DVT prophylaxis.

## 2013-01-16 ENCOUNTER — Inpatient Hospital Stay (HOSPITAL_COMMUNITY): Payer: Medicare Other

## 2013-01-16 ENCOUNTER — Encounter (HOSPITAL_COMMUNITY): Payer: Self-pay | Admitting: Orthopedic Surgery

## 2013-01-16 LAB — CBC
Hemoglobin: 9.2 g/dL — ABNORMAL LOW (ref 12.0–15.0)
MCH: 22.7 pg — ABNORMAL LOW (ref 26.0–34.0)
MCHC: 32.3 g/dL (ref 30.0–36.0)
Platelets: 170 10*3/uL (ref 150–400)
RDW: 20.3 % — ABNORMAL HIGH (ref 11.5–15.5)

## 2013-01-16 LAB — BASIC METABOLIC PANEL
Calcium: 8.4 mg/dL (ref 8.4–10.5)
GFR calc Af Amer: 48 mL/min — ABNORMAL LOW (ref >90)
GFR calc non Af Amer: 42 mL/min — ABNORMAL LOW (ref >90)
Glucose, Bld: 102 mg/dL — ABNORMAL HIGH (ref 70–99)
Potassium: 4 mEq/L (ref 3.5–5.1)
Sodium: 136 mEq/L (ref 135–145)

## 2013-01-16 MED ORDER — METOPROLOL SUCCINATE 12.5 MG HALF TABLET
12.5000 mg | ORAL_TABLET | Freq: Every day | ORAL | Status: DC
  Administered 2013-01-16 – 2013-01-17 (×2): 12.5 mg via ORAL
  Filled 2013-01-16 (×2): qty 1

## 2013-01-16 MED ORDER — IPRATROPIUM BROMIDE 0.02 % IN SOLN
0.5000 mg | Freq: Two times a day (BID) | RESPIRATORY_TRACT | Status: DC
  Administered 2013-01-16 – 2013-01-17 (×2): 0.5 mg via RESPIRATORY_TRACT
  Filled 2013-01-16 (×2): qty 2.5

## 2013-01-16 MED ORDER — ALBUTEROL SULFATE (5 MG/ML) 0.5% IN NEBU
2.5000 mg | INHALATION_SOLUTION | Freq: Two times a day (BID) | RESPIRATORY_TRACT | Status: DC
  Administered 2013-01-16 – 2013-01-17 (×2): 2.5 mg via RESPIRATORY_TRACT
  Filled 2013-01-16 (×2): qty 0.5

## 2013-01-16 NOTE — Evaluation (Signed)
Physical Therapy Evaluation Patient Details Name: [name] MRN: [id number] DOB: 09-Jan-1929 Today's Date: 01/16/2013 Time: 1478-2956 PT Time Calculation (min): 17 min  PT Assessment / Plan / Recommendation History of Present Illness  Fall resulting in L hip fx; s/p ORIF; WBAT  Clinical Impression  Patient is s/p L hip ORIF surgery resulting in functional limitations due to the deficits listed below (see PT Problem List).  Patient will benefit from skilled PT to increase their independence and safety with mobility to allow discharge to the venue listed below.   Of note: pt has gone to short-term rehab at [facility] and had good outcomes there;   If pt progresses quickly, it is also worth considering dc home with HHservices and prn husband assist; We will continue to assess for best dc option with each session      PT Assessment  Patient needs continued PT services    Follow Up Recommendations  SNF;Supervision/Assistance - 24 hour    Does the patient have the potential to tolerate intense rehabilitation      Barriers to Discharge        Equipment Recommendations  None recommended by PT (pretty well-equipped)    Recommendations for Other Services OT consult   Frequency Min 6X/week    Precautions / Restrictions Precautions Precautions: Fall Restrictions Weight Bearing Restrictions: Yes LLE Weight Bearing: Weight bearing as tolerated  Dysphagia Precautions  Pertinent Vitals/Pain No noted grimace with moving patient repositioned for comfort       Mobility  Bed Mobility Bed Mobility: Supine to Sit;Sitting - Scoot to Edge of Bed Supine to Sit: 4: Min assist;With rails Sitting - Scoot to [place] of Bed: 4: Min assist;With rail Details for Bed Mobility Assistance: Cues for technique, safety and hand placement Transfers Transfers: Sit to Stand;Stand to Sit Sit to Stand: 4: Min assist;From bed Stand to Sit: 4: Min assist;To chair/3-in-1;With upper extremity assist;With  armrests Details for Transfer Assistance: Cues for hand placement and technique Ambulation/Gait Ambulation/Gait Assistance: 3: Mod assist Ambulation Distance (Feet): 5 Feet Assistive device: Rolling walker Ambulation/Gait Assistance Details: Cues for gait sequence and physical assist to advance RW Gait Pattern: Step-to pattern    Exercises     PT Diagnosis: Difficulty walking;Acute pain  PT Problem List: Decreased strength;Decreased range of motion;Decreased activity tolerance;Decreased balance;Decreased mobility;Decreased knowledge of use of DME;Decreased knowledge of precautions;Pain PT Treatment Interventions: DME instruction;Gait training;Functional mobility training;Therapeutic activities;Therapeutic exercise;Balance training;Patient/family education     PT Goals(Current goals can be found in the care plan section) Acute Rehab PT Goals Patient Stated Goal: did not state PT Goal Formulation: With patient/family Time For Goal Achievement: 01/30/13 Potential to Achieve Goals: Good  Visit Information  Last PT Received On: 01/16/13 Assistance Needed: +1 History of Present Illness: Fall resulting in L hip fx; s/p ORIF; WBAT       Prior Functioning  Home Living Family/patient expects to be discharged to:: Skilled nursing facility Additional Comments: May be able to dc straight home with husband assist if progresses quickly Prior Function Level of Independence: Independent with assistive device(s) (amb with RW) Communication Communication: HOH    Cognition  Cognition Arousal/Alertness: Awake/alert Behavior During Therapy: WFL for tasks assessed/performed Overall Cognitive Status: Difficult to assess Difficult to assess due to: Hard of hearing/deaf    Extremity/Trunk Assessment Upper Extremity Assessment Upper Extremity Assessment: Overall WFL for tasks assessed Lower Extremity Assessment Lower Extremity Assessment: LLE deficits/detail LLE Deficits / Details: Actively  initiates movement with LLE LLE: Unable to fully assess  due to pain Cervical / Trunk Assessment Cervical / Trunk Assessment: Normal   Balance    End of Session PT - End of Session Equipment Utilized During Treatment: Gait belt Activity Tolerance: Patient tolerated treatment well Patient left: in chair;with call bell/phone within reach Nurse Communication: Mobility status  GP     Van Clines [facility] [place], [place] [phone]  01/16/2013, 12:26 PM

## 2013-01-16 NOTE — Evaluation (Signed)
Occupational Therapy Evaluation Patient Details Name: [name] MRN: [id number] DOB: [birth year] Today's Date: 01/16/2013 Time: 1308-6578 OT Time Calculation (min): 16 min  OT Assessment / Plan / Recommendation History of present illness Fall resulting in L hip fx; s/p ORIF; WBAT   Clinical Impression   Patient is s/p L hip ORIF surgery. Pt presents with below problem list. Patient will benefit from skilled OT to increase their independence and safety to allow discharge to the venue listed below.  If pt progresses quickly, it is also worth considering dc home with HHservices and prn husband assist; We will continue to assess for best dc option with each session.     OT Assessment  Patient needs continued OT Services    Follow Up Recommendations  SNF    Barriers to Discharge      Equipment Recommendations  Other (comment) (defer to SNF)    Recommendations for Other Services    Frequency  Min 2X/week    Precautions / Restrictions Precautions Precautions: Fall Restrictions Weight Bearing Restrictions: Yes LLE Weight Bearing: Weight bearing as tolerated   Pertinent Vitals/Pain Pt groaning with movement. Repositioned and notified nurse for pain meds.     ADL  Eating/Feeding: Maximal assistance;+1 Total assistance Where Assessed - Eating/Feeding: Chair;Bed level Grooming: Moderate assistance Where Assessed - Grooming: Supported sitting Upper Body Bathing: Moderate assistance Where Assessed - Upper Body Bathing: Supported sitting Lower Body Bathing: Maximal assistance Where Assessed - Lower Body Bathing: Supported sit to stand Upper Body Dressing: Moderate assistance Where Assessed - Upper Body Dressing: Supported sitting Lower Body Dressing: Maximal assistance Where Assessed - Lower Body Dressing: Supported sit to Pharmacist, hospital: Minimal assistance;Moderate assistance Toilet Transfer Method: Stand pivot;Sit to stand (Min A for sit to stand and Mod A-stand  pivot) Acupuncturist: Other (comment) (from bed) Tub/Shower Transfer Method: Not assessed Equipment Used: Gait belt;Rolling walker Transfers/Ambulation Related to ADLs: Mod A-stand pivot to chair and Min A for sit <> stand transfers. ADL Comments: Pt very HOH. Pt's spouse feeding her upon arrival. He states he has been feeding her the last few weeks.  Max A for LB ADLs.    OT Diagnosis: Acute pain;Cognitive deficits  OT Problem List: Decreased strength;Decreased range of motion;Decreased activity tolerance;Impaired balance (sitting and/or standing);Decreased cognition;Decreased safety awareness;Decreased knowledge of use of DME or AE;Decreased knowledge of precautions;Pain OT Treatment Interventions: Self-care/ADL training;DME and/or AE instruction;Therapeutic activities;Patient/family education;Balance training;Cognitive remediation/compensation   OT Goals(Current goals can be found in the care plan section) Acute Rehab OT Goals Patient Stated Goal: did not state ADL Goals Pt Will Perform Grooming: with modified independence;standing;sitting Pt Will Perform Upper Body Dressing: with modified independence;sitting Pt Will Perform Lower Body Dressing: with modified independence;sit to/from stand Pt Will Transfer to Toilet: with modified independence;ambulating;bedside commode Pt Will Perform Toileting - Clothing Manipulation and hygiene: with modified independence;sit to/from stand  Visit Information  Last OT Received On: 01/16/13 Assistance Needed: +1 History of Present Illness: Fall resulting in L hip fx; s/p ORIF; WBAT       Prior Functioning     Home Living Family/patient expects to be discharged to:: Skilled nursing facility Prior Function Level of Independence: Needs assistance (amb with RW) ADL's / Homemaking Assistance Needed: Mod A for bathing. Spouse states she was dressing herself. Communication / Swallowing Assistance Needed: Spouse has been feeding her the  last few weeks Communication Communication: HOH         Vision/Perception     Cognition  Cognition Arousal/Alertness:  Awake/alert Behavior During Therapy: WFL for tasks assessed/performed Overall Cognitive Status: Difficult to assess (spouse reports she seems at baseline with the exception of not eating unless he feeds her): Could not state the year.  Difficult to assess due to: Hard of hearing/deaf    Extremity/Trunk Assessment Upper Extremity Assessment Upper Extremity Assessment: RUE deficits/detail RUE Deficits / Details: decreased ROM in Rt shoulder     Mobility Bed Mobility Bed Mobility: Supine to Sit;Sitting - Scoot to Edge of Bed Supine to Sit: 4: Min assist Sitting - Scoot to Edge of Bed: 4: Min assist Details for Bed Mobility Assistance: Cues for technique. Assist with LE's. Transfers Transfers: Sit to Stand;Stand to Sit Sit to Stand: 4: Min assist;With upper extremity assist;From bed Stand to Sit: 4: Min assist;To chair/3-in-1 Details for Transfer Assistance: cues for hand placement.     Exercise     Balance     End of Session OT - End of Session Equipment Utilized During Treatment: Gait belt;Rolling walker Activity Tolerance: Patient tolerated treatment well Patient left: in chair;with call bell/phone within reach;with family/visitor present Nurse Communication: Mobility status  GO     Earlie Raveling OTR/L [phone] 01/16/2013, 5:35 PM

## 2013-01-16 NOTE — Progress Notes (Signed)
Patient has a bed at [facility]. SW will continue to follow and assist with all d/c needs.  Sabino Niemann, MSW, [phone]

## 2013-01-16 NOTE — Progress Notes (Signed)
  Date: 01/16/2013  Patient name: [name]  Medical record number: [id number]  Date of birth: October 08, 1928   This patient has been seen and the plan of care was discussed with the house staff. Please see their note for complete details. I concur with their findings with the following additions/corrections:  Doing well POD #1 Left cannulated hip pinning. SLP has evaluated patient and is concerned about aspiration as well. Will need further evaluation by MBS. This is concerning due to her hx of esophageal stricture and recent decreased PO, emesis, and finding of RLL infiltrate. Denies pain. Surgical site L hip is c/d/i. No CP, no SOB. Husband was not present in room during visit.  [name], DO 01/16/2013, 1:43 PM

## 2013-01-16 NOTE — Evaluation (Signed)
Clinical/Bedside Swallow Evaluation Patient Details  Name: [name] MRN: [id number] Date of Birth: [birth year]  Today's Date: 01/16/2013 Time: 7829-5621 SLP Time Calculation (min): 21 min  Past Medical History:  Past Medical History  Diagnosis Date  . Coronary artery disease     s/p PCI in 1999 with peri-cath CVA, previous stress test  which showed ischemia in the inferior and inferolateral wall--> refused cardiac catheterization and has been treated medically though does not want to take statin or ASA  . CVA (cerebral vascular accident)   . GERD (gastroesophageal reflux disease)   . Hypertension   . Femoral neck fracture 12/2008    right  . Asthma   . Hyperlipidemia   . Breast cancer   . Cryptogenic cirrhosis     Dr. Arlyce Dice  . GI bleed Mar 2012    transfused 4 units PRBCs EGD showed bleed at duodenal bulb.  Plavix was D/C at this time.    Past Surgical History:  Past Surgical History  Procedure Laterality Date  . Hemiarthroplasty hip    . Cardiac catheterization    . Bilateral mastectomy    . Abdominal hysterectomy    . Laparotomy  10/29/2011    Procedure: EXPLORATORY LAPAROTOMY;  Surgeon: Robyne Askew, MD;  Location: [facility] OR;  Service: General;  Laterality: N/A;  EXPLORATORY LAPAROTOMY, LYSIS OF ADHESIONS, PERITONEAL WASHINGS.   HPI:  [name] is an 79yo woman with PMH of CAD s/p stent in 1999, peri cath CVA, asthma, HTN, HLD, cirrhosis, Rt hip replacement, CKD, h/o GI bleeding who presents after a fall. She fell and fx her left hip, underwent repair on 7/7. Apparently for the last 3-6 days pta Ms. Teodoro had been more somnolent and groggy, but able to move around with her walker and interact.  Of note, over the last 3 days pta, Ms. Forness had been complaining of more pain and taking aleve twice a day. She takes reflux medication, and pt and husband report history of trouble swallowing and about 3 episodes a week of emesis/coughing up food. CXR on admission showed RLL pna  which could represent aspiration pneumonitis. Records show an MBS in 2002 with no aspiration, but documented distal esophageal narrowing and a history of DISTAL ESOPHAGEAL STRICTURE.  HISTORY OF FUNDOPLICATION FOR HIATAL HERNIA IN 1997.   Assessment / Plan / Recommendation Clinical Impression  Pt demonstrates overt evidence of aspiration including consistent cough/throat clear after every sip of thin liquids. Pt also with multiple risk factors for aspiration including history of esophageal stricture, RLL pna on admit and deconditioning with hip fx. Pt would benefit from objective test to determine best diet during recovery. Pt recommended to consume purees and pudding today, meds crushed (apparently masticates at baseline), no liquids until MBS complete. Discussed with RN.     Aspiration Risk  Moderate    Diet Recommendation Dysphagia 1 (Puree);Pudding-thick liquid   Medication Administration: Crushed with puree Supervision: Staff feed patient Compensations: Slow rate Postural Changes and/or Swallow Maneuvers: Seated upright 90 degrees;Upright 30-60 min after meal    Other  Recommendations Recommended Consults: MBS Oral Care Recommendations: Oral care BID   Follow Up Recommendations  Skilled Nursing facility    Frequency and Duration min 2x/week  2 weeks   Pertinent Vitals/Pain NA    SLP Swallow Goals     Swallow Study Prior Functional Status       General HPI: [name] is an 79yo woman with PMH of CAD s/p stent in 1999,  peri cath CVA, asthma, HTN, HLD, cirrhosis, Rt hip replacement, CKD, h/o GI bleeding who presents after a fall. She fell and fx her left hip, underwent repair on 7/7. Apparently for the last 3-6 days pta Ms. Salomone had been more somnolent and groggy, but able to move around with her walker and interact.  Of note, over the last 3 days pta, Ms. Pressey had been complaining of more pain and taking aleve twice a day. She takes reflux medication, and pt and husband  report history of trouble swallowing and about 3 episodes a week of emesis/coughing up food. CXR on admission showed RLL pna which could represent aspiration pneumonitis. Records show an MBS in 2002 with no aspiration, but documented distal esophageal narrowing and a history of DISTAL ESOPHAGEAL STRICTURE.  HISTORY OF FUNDOPLICATION FOR HIATAL HERNIA IN 1997. Type of Study: Bedside swallow evaluation Previous Swallow Assessment: MBS 2002 regular thin Diet Prior to this Study: NPO Temperature Spikes Noted: No Respiratory Status: Supplemental O2 delivered via (comment) History of Recent Intubation: Yes Length of Intubations (days): 1 days Date extubated: 01/15/13 Behavior/Cognition: Alert;Confused;Cooperative Oral Cavity - Dentition: Dentures, top;Dentures, bottom Self-Feeding Abilities: Needs assist;Total assist Patient Positioning: Upright in bed Baseline Vocal Quality: Hoarse Volitional Cough: Weak Volitional Swallow: Unable to elicit    Oral/Motor/Sensory Function Overall Oral Motor/Sensory Function: Other (comment) (pt could not follow commands to complete, no focal weakness)   Ice Chips     Thin Liquid Thin Liquid: Impaired Presentation: Cup;Self Fed Pharyngeal  Phase Impairments: Suspected delayed Swallow;Throat Clearing - Immediate;Cough - Immediate    Nectar Thick Nectar Thick Liquid: Not tested   Honey Thick Honey Thick Liquid: Not tested   Puree Puree: Within functional limits   Solid   GO    Solid: Not tested      Harlon Ditty, MA CCC-SLP [phone]  Claudine Mouton 01/16/2013,9:01 AM

## 2013-01-16 NOTE — Progress Notes (Signed)
Patient ID: [name], female   DOB: 1928/08/15, 79 y.o.   MRN: [id number]     Subjective:  Patient reports pain as mild to moderate.  Patient states that she was able to rest and felt better today.  Objective:   VITALS:   Filed Vitals:   01/15/13 2030 01/15/13 2056 01/16/13 0052 01/16/13 0659  BP:  128/40 122/46 111/45  Pulse: 82 79 86 81  Temp:  97.9 F (36.6 C)  97.8 F (36.6 C)  TempSrc:      Resp: 23 18  16   Height: 5' 2.99" (1.6 m)     Weight:      SpO2: 97% 92%      ABD soft Sensation intact distally Dorsiflexion/Plantar flexion intact Incision: dressing C/D/I and no drainage   Lab Results  Component Value Date   WBC 8.0 01/16/2013   HGB 9.2* 01/16/2013   HCT 28.5* 01/16/2013   MCV 70.2* 01/16/2013   PLT 170 01/16/2013     Assessment/Plan: 1 Day Post-Op   Principal Problem:   Fracture of femoral neck, left Active Problems:   CAD (coronary artery disease)   Cirrhosis of liver   Microcytic anemia   Unspecified protein-calorie malnutrition   NSTEMI (non-ST elevated myocardial infarction)   Renal insufficiency   Protein-calorie malnutrition, severe   Advance diet Up with therapy WBAT left lower ext. Dry dressing PRN  Continue plan per medicine   Haskel Khan 01/16/2013, 7:41 AM   Teryl Lucy, MD Cell [phone]

## 2013-01-16 NOTE — Progress Notes (Signed)
Subjective: Patient underwent cannulated hip pinning Sx of left femur yesterday.  Reports she is feeling fine today.  Denies any episodes overnight of trouble swallowing or emesis.  Denies being in any current pain.  Husband was not at bedside today. Objective: Vital signs in last 24 hours: Filed Vitals:   01/16/13 0052 01/16/13 0659 01/16/13 0857 01/16/13 1404  BP: 122/46 111/45  109/41  Pulse: 86 81  76  Temp:  97.8 F (36.6 C)  97.9 F (36.6 C)  TempSrc:    Oral  Resp:  16  16  Height:      Weight:      SpO2:   95% 92%   Weight change:   Intake/Output Summary (Last 24 hours) at 01/16/13 1626 Last data filed at 01/16/13 0703  Gross per 24 hour  Intake    800 ml  Output    240 ml  Net    560 ml   General: resting in bed  HEENT: EOMI, no scleral icterus  Cardiac: RRR, no rubs, murmurs or gallops  Pulm: decreased breath sounds over lower lobes. Abd: soft, nontender, nondistended, BS normoactive  Ext: warm and well perfused, no pedal edema, mild tenderness to palpation of left hip, bandage is c/d/i. Mild erythemia and some excoriations noted on left leg.  Neuro: alert and oriented X1, cranial nerves II-XII grossly intact  Lab Results: Basic Metabolic Panel:  Recent Labs Lab 01/14/13 0701 01/15/13 0010 01/16/13 0622  NA 137 134* 136  K 3.9 4.0 4.0  CL 97 97 98  CO2 30 29 26   GLUCOSE 133* 136* 102*  BUN 23 28* 35*  CREATININE 1.12* 1.03 1.19*  CALCIUM 8.5 8.2* 8.4  MG 2.1  --   --    Liver Function Tests:  Recent Labs Lab 01/14/13 0701 01/15/13 0010  AST 23 29  ALT 11 10  ALKPHOS 70 59  BILITOT 3.7* 0.8  PROT 6.6 6.0  ALBUMIN 3.1* 2.5*   CBC:  Recent Labs Lab 01/15/13 0010 01/16/13 0622  WBC 9.9 8.0  HGB 9.4* 9.2*  HCT 28.3* 28.5*  MCV 69.2* 70.2*  PLT 155 170   Cardiac Enzymes:  Recent Labs Lab 01/14/13 0700 01/14/13 1235 01/15/13 0009  TROPONINI 1.06* 1.16* 0.93*   Coagulation:  Recent Labs Lab 01/14/13 0700  LABPROT 17.8*    INR 1.51*   Urinalysis:  Recent Labs Lab 01/13/13 1409  COLORURINE YELLOW  LABSPEC 1.013  PHURINE 5.5  GLUCOSEU NEGATIVE  HGBUR NEGATIVE  BILIRUBINUR NEGATIVE  KETONESUR NEGATIVE  PROTEINUR NEGATIVE  UROBILINOGEN 0.2  NITRITE NEGATIVE  LEUKOCYTESUR MODERATE*    Micro Results: Recent Results (from the past 240 hour(s))  MRSA PCR SCREENING     Status: None   Collection Time    01/14/13  6:06 PM      Result Value Range Status   MRSA by PCR NEGATIVE  NEGATIVE Final   Comment:            The GeneXpert MRSA Assay (FDA     approved for NASAL specimens     only), is one component of a     comprehensive MRSA colonization     surveillance program. It is not     intended to diagnose MRSA     infection nor to guide or     monitor treatment for     MRSA infections.   Studies/Results: Dg Hip Operative Left  01/15/2013   *RADIOLOGY REPORT*  Clinical Data: Femoral fixation.  OPERATIVE  LEFT HIP  Comparison: 01/13/2013  Findings: Two intraoperative views which demonstrate screw fixation of the proximal left femur. No acute hardware complication. Subcapital femoral fracture is less well visualized today.  IMPRESSION: Intraoperative imaging of fixation of proximal left femur.   Original Report Authenticated By: Jeronimo Greaves, M.D.   Dg Pelvis Portable  01/15/2013   *RADIOLOGY REPORT*  Clinical Data: Postop for left hip pinning.  PORTABLE PELVIS  Comparison: 01/13/2013 and intraoperative imaging of same date.  Findings: Interval screw fixation of the previously described subcapital left femoral fracture. No acute hardware complication. Right hip arthroplasty incidentally noted.  IMPRESSION: Expected appearance after proximal left femoral fixation.   Original Report Authenticated By: Jeronimo Greaves, M.D.   Dg Hip Portable 1 View Left  01/15/2013   *RADIOLOGY REPORT*  Clinical Data: Postop of left hip fixation.  PORTABLE LEFT HIP - 1 VIEW  Comparison: Intraoperative imaging of earlier today and  preoperative imaging of 01/13/2013  Findings: Single lateral view of the left hip which demonstrates screw fixation of the proximal left femoral fracture.  The femoral head and proximal portion of the neck are not included on this lateral view.  IMPRESSION: Limited lateral imaging demonstrating screw fixation of the previously described femoral neck fracture.   Original Report Authenticated By: Jeronimo Greaves, M.D.   Dg Swallowing Func-speech Pathology  01/16/2013   [name], CCC-SLP     01/16/2013  3:12 PM Objective Swallowing Evaluation: Modified Barium Swallowing Study   Patient Details  Name: [name] MRN: [id number] Date of Birth: [birth year]  Today's Date: 01/16/2013 Time: 4696-2952 SLP Time Calculation (min): 25 min  Past Medical History:  Past Medical History  Diagnosis Date  . Coronary artery disease     s/p PCI in 1999 with peri-cath CVA, previous stress test  which  showed ischemia in the inferior and inferolateral wall--> refused  cardiac catheterization and has been treated medically though  does not want to take statin or ASA  . CVA (cerebral vascular accident)   . GERD (gastroesophageal reflux disease)   . Hypertension   . Femoral neck fracture 12/2008    right  . Asthma   . Hyperlipidemia   . Breast cancer   . Cryptogenic cirrhosis     Dr. Arlyce Dice  . GI bleed Mar 2012    transfused 4 units PRBCs EGD showed bleed at duodenal bulb.   Plavix was D/C at this time.    Past Surgical History:  Past Surgical History  Procedure Laterality Date  . Hemiarthroplasty hip    . Cardiac catheterization    . Bilateral mastectomy    . Abdominal hysterectomy    . Laparotomy  10/29/2011    Procedure: EXPLORATORY LAPAROTOMY;  Surgeon: Robyne Askew,  MD;  Location: [facility] OR;  Service: General;  Laterality: N/A;   EXPLORATORY LAPAROTOMY, LYSIS OF ADHESIONS, PERITONEAL WASHINGS.   HPI:  Ms. [name] is an 79yo woman with PMH of CAD s/p stent in 1999,  peri cath CVA, asthma, HTN, HLD, cirrhosis, Rt hip replacement,   CKD, h/o GI bleeding who presents after a fall. She fell and fx  her left hip, underwent repair on 7/7. Apparently for the last  3-6 days pta Ms. [name] had been more somnolent and groggy, but  able to move around with her walker and interact.  Of note, over  the last 3 days pta, Ms. Alviar had been complaining of more pain  and taking aleve twice a day. She takes reflux  medication, and pt  and husband report history of trouble swallowing and about 3  episodes a week of emesis/coughing up food. CXR on admission  showed RLL pna which could represent aspiration pneumonitis.  Records show an MBS in 2002 with no aspiration, but documented  distal esophageal narrowing and a history of DISTAL ESOPHAGEAL  STRICTURE.  HISTORY OF FUNDOPLICATION FOR HIATAL HERNIA IN 1997.     Assessment / Plan / Recommendation Clinical Impression  Dysphagia Diagnosis: Mild oral phase dysphagia;Mild pharyngeal  phase dysphagia;Mild cervical esophageal phase dysphagia Clinical impression: Pt demonstrates a mild oral dysphagia  related to loose dentition. She demonstrates ability to [facility]  very soft solids without significant difficulty. There is a mild  delay in swallow initiation that results in trace flash  penetration. No aspiraiton of thin liquids observed during test.  After fluoro turned off, pt given water resulting in throat  clear. Suspect frank penetration in this case, but with  successful sensation and expulsion. Esophageal function appeared  [facility] though a bite of puree is required to transit pills.   Suspect that when pt is lethargic, has poor positioning or is  particularly weak, episodes of aspiration could occur. Recommend  pt initiate a dys 2/thin liquid diet with full supervision and  aspiration precautions.  SLP will follow for tolerance and  precautions.      Treatment Recommendation  Therapy as outlined in treatment plan below    Diet Recommendation Dysphagia 2 (Fine chop);Thin liquid   Liquid Administration via:  Cup;Straw Medication Administration: Whole meds with puree Supervision: Full supervision/cueing for compensatory  strategies;Staff feed patient Compensations: Slow rate;Small sips/bites;Follow solids with  liquid Postural Changes and/or Swallow Maneuvers: Seated upright 90  degrees;Upright 30-60 min after meal;Out of bed for meals    Other  Recommendations Recommended Consults: MBS Oral Care Recommendations: Oral care BID   Follow Up Recommendations  Skilled Nursing facility    Frequency and Duration min 2x/week  2 weeks   Pertinent Vitals/Pain NA    SLP Swallow Goals     General HPI: Ms. Gugliotta is an 79yo woman with PMH of CAD s/p  stent in 1999, peri cath CVA, asthma, HTN, HLD, cirrhosis, Rt hip  replacement, CKD, h/o GI bleeding who presents after a fall. She  fell and fx her left hip, underwent repair on 7/7. Apparently for  the last 3-6 days pta Ms. Larkin had been more somnolent and  groggy, but able to move around with her walker and interact.  Of  note, over the last 3 days pta, Ms. Jaworski had been complaining  of more pain and taking aleve twice a day. She takes reflux  medication, and pt and husband report history of trouble  swallowing and about 3 episodes a week of emesis/coughing up  food. CXR on admission showed RLL pna which could represent  aspiration pneumonitis. Records show an MBS in 2002 with no  aspiration, but documented distal esophageal narrowing and a  history of DISTAL ESOPHAGEAL STRICTURE.  HISTORY OF  FUNDOPLICATION FOR HIATAL HERNIA IN 1997. Type of Study: Modified Barium Swallowing Study Reason for Referral: Objectively evaluate swallowing function Previous Swallow Assessment: MBS 2002 regular thin Diet Prior to this Study: Dysphagia 1 (puree) Temperature Spikes Noted: No Respiratory Status: Supplemental O2 delivered via (comment) History of Recent Intubation: Yes Length of Intubations (days): 1 days Date extubated: 01/15/13 Behavior/Cognition: Alert;Confused;Cooperative Oral Cavity -  Dentition: Dentures, top;Dentures, bottom Oral Motor / Sensory Function: Within functional limits Self-Feeding Abilities: Needs assist  Patient Positioning: Upright in chair Baseline Vocal Quality: Clear Volitional Cough: Strong Volitional Swallow: Able to elicit Anatomy: Within functional limits Pharyngeal Secretions: Not observed secondary MBS    Reason for Referral Objectively evaluate swallowing function   Oral Phase Oral Preparation/Oral Phase Oral Phase: Impaired Oral - Thin Oral - Thin Cup: Holding of bolus Oral - Thin Straw: Holding of bolus Oral - Solids Oral - Puree: Holding of bolus Oral - Regular: Impaired mastication (loose dentures) Oral - Pill: Within functional limits   Pharyngeal Phase Pharyngeal Phase Pharyngeal Phase: Impaired Pharyngeal - Thin Pharyngeal - Thin Cup: Delayed swallow  initiation;Penetration/Aspiration before swallow Penetration/Aspiration details (thin cup): Material enters  airway, remains ABOVE vocal cords then ejected out Pharyngeal - Thin Straw: Delayed swallow  initiation;Penetration/Aspiration before swallow Penetration/Aspiration details (thin straw): Material enters  airway, remains ABOVE vocal cords then ejected out Pharyngeal - Solids Pharyngeal - Puree: Within functional limits Pharyngeal - Mechanical Soft: Within functional limits Pharyngeal - Pill: Within functional limits  Cervical Esophageal Phase    GO    Cervical Esophageal Phase Cervical Esophageal Phase: Impaired Cervical Esophageal Phase - Comment Cervical Esophageal Comment: Pill briefly lodged in proximal  esophagus, passed easily when given a bolus of puree, pill passes  through GE junction without difficulty.         Harlon Ditty, [place] CCC-SLP [phone]  [name] 01/16/2013, 3:11 PM    Medications: I have reviewed the patient's current medications. Scheduled Meds: . albuterol  2.5 mg Nebulization BID  . docusate sodium  100 mg Oral BID  . enoxaparin (LOVENOX) injection  40 mg Subcutaneous  Q24H  . hydrocerin   Topical BID  . ipratropium  0.5 mg Nebulization BID  . metoprolol succinate  12.5 mg Oral Daily  . pantoprazole (PROTONIX) IV  40 mg Intravenous Q12H  . senna  1 tablet Oral BID  . sodium chloride  3 mL Intravenous Q12H   Continuous Infusions: . 0.45 % NaCl with KCl 20 mEq / L 75 mL/hr at 01/15/13 2137   PRN Meds:.acetaminophen, acetaminophen, alum & mag hydroxide-simeth, bisacodyl, HYDROcodone-acetaminophen, menthol-cetylpyridinium, metoCLOPramide (REGLAN) injection, metoCLOPramide, morphine injection, ondansetron (ZOFRAN) IV, ondansetron, phenol, polyethylene glycol, sodium chloride Assessment/Plan: Fracture of femoral neck, left  -POD#1 Left cannulated hip pinning by Dr. Dion Saucier. -Weight bearing as tolerated. -Dry dressings PRN. -Continue Dys2 (thin liquid diet NSTEMI Type II  -Cardiology on board, likely due to demand ischemia. -Troponins trending down.  No current chest pain. -Continue to monitor. Symptomatic microcytic anemia  -Transfuse 2 units of PRBC on 7/5 with improvement of Hgb from 7.6 to 10.0. Hgb slightly decreased to 9.2 today.  - Hemoccult ordered  - Peripheral smear showed Polychromasia. LDH was 176 and Haptoglobin pending. T. Bili elevated after transfusion, now returned to normal. Currently no signs of active bleeding.  -CBC, BMet in AM.  CAD (coronary artery disease)  -Patient with known coronary artery disease, 1 stent placed in 1999, but refused a second stent at a later time after the first catheretization was complicated by a CVA. Patient has had chronic HLD since her youth and has declined therapy after failing to find a lipid lowering agent that was tolerated and also lowered her cholesterol significantly. She has refused aspirin therapy. Her last lexiscan on file was in 2011 when she had an EF of 60% with findings suggestive of inferior and inferolateral infarction with some perinfarction ischemia.  -Continue home medications, monitor blood  pressure.  -Monitor I&Os  Cirrhosis of liver  -Patient's  records indicate she was diagnosed with cryptogenic cirrhosis in April of 2013 after being hospitalized for partial small bowel obstruction. Patient with findings from CXR that are consistent with COPD. Patient's husband reports she never smoked. Liver cirrhosis and COPD could be related to antitrypsin deficiency.  Asthma/COPD?  - Patient with history of Asthma, findings on Xray suggest emphysematous changes. Patient reports home use of O2. Patiently likely has COPD.  -Albuterol nebs Q4 Atrovent nebs Q4  -O2 by nasal cannula to maintain O2 sat >92%.  Chronic Kidney Disease Stage 2  -Patient has history of chronic kidney disease, renal function has decreased from sporadic readings which indicate CKD stage 2 with GFR ~80 to current bump in creatinine of 1.1 and GFR of 46. This could be a progression of kidney disease to stage 3 or an acute on chronic kidney injury. Patient has recently increased her dose of lasix, will hold back on lasix dose and monitor fluid status as well as trend creatinine. Patient's husband also reports patient was recently taking more naproxen than usual.  -Creatinine had slight bump to 1.19 from 1.03 on 7/7. Will continue to follow.  Hypertension  - Continue to monitor  Dysphasia  -Patient and husband reported history of trouble swallowing and about 3 episodes a week of emesis/coughing up her food. CXR on admission showed possible RLL infiltrate which could represent aspiration pneumonitis.  -SLP evaluated, MBS did not show aspiration but will start dys2 diet and supervision and aspiration precautions, SLP will continue to follow. Left lower leg rash  -Not warm, not tender to palpation, does not appear to be cellulitis.  -Eucerin cream BID.  DVT prophylaxis: SCDs  Code Status: DNR/DNI  Dispo: Disposition is deferred at this time, awaiting improvement of current medical problems. Anticipated discharge in approximately  1-2 day(s).  The patient does have a current PCP [name], MD) and does not need an [facility] hospital follow-up appointment after discharge.  The patient does not have transportation limitations that hinder transportation to clinic appointments.   .Services Needed at time of discharge: Y = Yes, Blank = No PT:   OT:   RN:   Equipment:   Other:     LOS: 3 days   Carlynn Purl, DO 01/16/2013, 4:26 PM

## 2013-01-16 NOTE — Progress Notes (Signed)
Subjective:  Patient is doing well after hip surgery yesterday. Denies chest pain or dyspnea.  Objective:  Vital Signs in the last 24 hours: Temp:  [97.3 F (36.3 C)-98.3 F (36.8 C)] 97.8 F (36.6 C) (07/08 0659) Pulse Rate:  [73-87] 81 (07/08 0659) Resp:  [12-23] 16 (07/08 0659) BP: (101-137)/(40-76) 111/45 mmHg (07/08 0659) SpO2:  [90 %-100 %] 92 % (07/07 2056) FiO2 (%):  [40 %] 40 % (07/07 1937)  Intake/Output from previous day: 07/07 0701 - 07/08 0700 In: 803 [I.V.:803] Out: 90 [Urine:60; Blood:30] Intake/Output from this shift: Total I/O In: -  Out: 150 [Urine:150]  . albuterol  2.5 mg Nebulization TID  .  ceFAZolin (ANCEF) IV  2 g Intravenous Q6H  . docusate sodium  100 mg Oral BID  . enoxaparin (LOVENOX) injection  40 mg Subcutaneous Q24H  . hydrocerin   Topical BID  . ipratropium  0.5 mg Nebulization TID  . metoprolol  2.5 mg Intravenous Q6H  . pantoprazole (PROTONIX) IV  40 mg Intravenous Q12H  . senna  1 tablet Oral BID  . sodium chloride  3 mL Intravenous Q12H   . 0.45 % NaCl with KCl 20 mEq / L 75 mL/hr at 01/15/13 2137    Physical Exam: The patient appears to be in no distress.  Head and neck exam reveals that the pupils are equal and reactive.  The extraocular movements are full.  There is no scleral icterus.  Mouth and pharynx are benign.  No lymphadenopathy.  No carotid bruits.  The jugular venous pressure is normal.  Thyroid is not enlarged or tender.  Chest mild bibasilar rhonchi.  Heart reveals no abnormal lift or heave.  First and second heart sounds are normal.  There is no murmur gallop rub or click.  The abdomen is soft and nontender.  Bowel sounds are normoactive.  There is no hepatosplenomegaly or mass.  There are no abdominal bruits.  Extremities reveal no phlebitis or edema.  Pedal pulses are good.  There is no cyanosis or clubbing.   Neurologic exam is normal strength and no lateralizing weakness.  No sensory deficits.  Integument  reveals no rash  Lab Results:  Recent Labs  01/15/13 0010 01/16/13 0622  WBC 9.9 8.0  HGB 9.4* 9.2*  PLT 155 170    Recent Labs  01/15/13 0010 01/16/13 0622  NA 134* 136  K 4.0 4.0  CL 97 98  CO2 29 26  GLUCOSE 136* 102*  BUN 28* 35*  CREATININE 1.03 1.19*    Recent Labs  01/14/13 1235 01/15/13 0009  TROPONINI 1.16* 0.93*   Hepatic Function Panel  Recent Labs  01/13/13 1926  01/15/13 0010  PROT 6.1  < > 6.0  ALBUMIN 2.9*  < > 2.5*  AST 22  < > 29  ALT 10  < > 10  ALKPHOS 64  < > 59  BILITOT 0.6  < > 0.8  BILIDIR 0.2  --   --   IBILI 0.4  --   --   < > = values in this interval not displayed. No results found for this basename: CHOL,  in the last 72 hours No results found for this basename: PROTIME,  in the last 72 hours  Imaging: Dg Hip Operative Left  01/15/2013   *RADIOLOGY REPORT*  Clinical Data: Femoral fixation.  OPERATIVE LEFT HIP  Comparison: 01/13/2013  Findings: Two intraoperative views which demonstrate screw fixation of the proximal left femur. No acute hardware complication. Subcapital  femoral fracture is less well visualized today.  IMPRESSION: Intraoperative imaging of fixation of proximal left femur.   Original Report Authenticated By: Jeronimo Greaves, M.D.   Dg Pelvis Portable  01/15/2013   *RADIOLOGY REPORT*  Clinical Data: Postop for left hip pinning.  PORTABLE PELVIS  Comparison: 01/13/2013 and intraoperative imaging of same date.  Findings: Interval screw fixation of the previously described subcapital left femoral fracture. No acute hardware complication. Right hip arthroplasty incidentally noted.  IMPRESSION: Expected appearance after proximal left femoral fixation.   Original Report Authenticated By: Jeronimo Greaves, M.D.   Dg Hip Portable 1 View Left  01/15/2013   *RADIOLOGY REPORT*  Clinical Data: Postop of left hip fixation.  PORTABLE LEFT HIP - 1 VIEW  Comparison: Intraoperative imaging of earlier today and preoperative imaging of 01/13/2013   Findings: Single lateral view of the left hip which demonstrates screw fixation of the proximal left femoral fracture.  The femoral head and proximal portion of the neck are not included on this lateral view.  IMPRESSION: Limited lateral imaging demonstrating screw fixation of the previously described femoral neck fracture.   Original Report Authenticated By: Jeronimo Greaves, M.D.    Cardiac Studies: Telemetry shows NSR. Assessment/Plan:  1. Known ischemic heart disease status post PCI in 1999 with precatheterization stroke. Known reversible ischemia by previous stress tests and refused further evaluation. No recent chest pain but does have elevated troponin consistent with type II non-STEMI secondary to demand ischemia from profound anemia and stress of hip fracture. Troponins trending down. 2. severe anemia with past history of bleeding duodenal ulcer and requiring 4 units of packed cell transfusion one year ago. Chronic history of melena and has been on high-dose NSAIDs in the form of naproxen. Hgb 9.2 this am. 3. fractured left hip secondary to mechanical fall.  Plan: Will switch to oral BB. Consider adding oral iron.   LOS: 3 days    Cassell Clement 01/16/2013, 7:53 AM

## 2013-01-16 NOTE — Discharge Summary (Signed)
Name: [name] MRN: [id number] DOB: Sep 21, 1928 79 y.o. PCP: Maria Puffer, MD  Date of Admission: 01/13/2013 12:21 PM Date of Discharge: 01/17/2013 Attending Physician: [name], DO  Discharge Diagnosis:   Fracture of femoral neck, left   Symptomatic Microcytic anemia   NSTEMI (non-ST elevated myocardial infarction)   CAD (coronary artery disease)   Asthma   COPD   Cirrhosis of liver   Renal insufficiency   Protein-calorie malnutrition, severe   Dysphasia  Discharge Medications:   Medication List    STOP taking these medications       furosemide 40 MG tablet  Commonly known as:  LASIX     furosemide 80 MG tablet  Commonly known as:  LASIX     naproxen sodium 220 MG tablet  Commonly known as:  ANAPROX     potassium chloride 10 MEQ tablet  Commonly known as:  K-DUR      TAKE these medications       bisacodyl 10 MG suppository  Commonly known as:  DULCOLAX  Place 1 suppository (10 mg total) rectally daily as needed.     diazepam 5 MG tablet  Commonly known as:  VALIUM  Take 5 mg by mouth as needed.     DSS 100 MG Caps  Take 100 mg by mouth 2 (two) times daily.     enoxaparin 40 MG/0.4ML injection  Commonly known as:  LOVENOX  Inject 0.4 mLs (40 mg total) into the skin daily.     ferrous sulfate 325 (65 FE) MG tablet  Take 1 tablet (325 mg total) by mouth 3 (three) times daily with meals.     HYDROcodone-acetaminophen 5-325 MG per tablet  Commonly known as:  NORCO  Take 1-2 tablets by mouth every 6 (six) hours as needed for pain. MAXIMUM TOTAL ACETAMINOPHEN DOSE IS 4000 MG PER DAY     metoprolol succinate 12.5 mg Tb24  Commonly known as:  TOPROL-XL  Take 0.5 tablets (12.5 mg total) by mouth daily.     montelukast 10 MG tablet  Commonly known as:  SINGULAIR  Take 10 mg by mouth as needed.     nitroGLYCERIN 0.4 MG SL tablet  Commonly known as:  NITROSTAT  Place 0.4 mg under the tongue every 5 (five) minutes as needed. Chest pain     omeprazole 20 MG capsule  Commonly known as:  PRILOSEC  Take 20 mg by mouth daily.     polyethylene glycol packet  Commonly known as:  MIRALAX / GLYCOLAX  Take 17 g by mouth daily as needed.     sennosides-docusate sodium 8.6-50 MG tablet  Commonly known as:  SENOKOT-S  Take 2 tablets by mouth daily.     VENTOLIN HFA 108 (90 BASE) MCG/ACT inhaler  Generic drug:  albuterol  2 puffs as needed.        Disposition and follow-up:   [name] was discharged from [facility] in Stable condition.  At the hospital follow up visit please address:  1.  PCP/SNF: Patient presented with symptomatic anemia, please recheck CBC.  Patient discharged with Iron supplementation.  Consider referral to GI for EGD +/- colonoscopy.  SLP saw patient in hospital, patient had limitations on MBS, patient started on dysphasia 2 diet (thin liquids), recommend SLP f/u in SNF, could be secondary to pain meds/lethargy, f/u with dysphasia symptoms and ensure adequate PO intake.  F/U Creatinine and corollate to baseline values. Cardiology- assess volume status, resume lasix (patient  had decreased PO intake and elevated creatinine on admission, lasix was held and patient continued to be euvolemic with 73ml/hr IVF.)   2.  Labs / imaging needed at time of follow-up: CBC, Bmet  3.  Pending labs/ test needing follow-up: None  Follow-up Appointments: Follow-up Information   Follow up with Maria Post, MD. Schedule an appointment as soon as possible for a visit in 2 weeks.   Contact information:   [address] [place] [address] [phone]       Schedule an appointment as soon as possible for a visit with Maria Nordmann, MD.   Contact information:   [address] [place] [address] [phone]       Schedule an appointment as soon as possible for a visit with Maria Puffer, MD. (hospital follow up, monitor anemia)    Contact information:    [address] [place] [address] [phone]       Discharge Instructions: Discharge Orders   Future Orders Complete By Expires     Call MD for:  persistant dizziness or light-headedness  As directed     Call MD for:  persistant nausea and vomiting  As directed     Call MD for:  redness, tenderness, or signs of infection (pain, swelling, redness, odor or green/yellow discharge around incision site)  As directed     Call MD for:  severe uncontrolled pain  As directed     Call MD for:  temperature >100.4  As directed     Diet - low sodium heart healthy  As directed     Discharge instructions  As directed     Comments:      -Please discuss anemia and decision to proceed with endoscopy to search for etiology with your primary care doctor.  Please start taking iron three times a day and take stool softeners/laxatives to keep regular bowel movements. -Please discuss re-initiation of lasix with your cardiologist - this is important because your are eating less, and we don't want you to become dehydrated    Increase activity slowly  As directed     Weight bearing as tolerated  As directed        Consultations:    Procedures Performed:  Dg Chest 1 View  01/13/2013   *RADIOLOGY REPORT*  Clinical Data: Fall, hip fracture, past history hypertension, asthma, coronary artery disease, breast cancer  CHEST - 1 VIEW  Comparison: 11/02/2011  Findings: Enlargement of cardiac silhouette. Atherosclerotic calcification aorta. Pulmonary vascular congestion. Changes of COPD with new infiltrate at right base. Minimal left basilar atelectasis. Central peribronchial thickening. Remaining lungs clear. No pleural effusion or pneumothorax. Diffuse osseous demineralization.  IMPRESSION: Enlargement of cardiac silhouette. Emphysematous and bronchitic changes consistent with COPD. Mild left basilar atelectasis with right basilar consolidation most likely representing pneumonia.   Original Report Authenticated By:  Ulyses Southward, M.D.   Dg Hip Complete Left  01/13/2013   *RADIOLOGY REPORT*  Clinical Data: Fall.  Left hip pain.  LEFT HIP - COMPLETE 2+ VIEW  Comparison: 10/26/2011  Findings: Acute subcapital left femoral neck fracture noted. Regional bony pelvis intact.  Right hip hemiarthroplasty noted. Bony demineralization is present.  IMPRESSION: 1.  Acute subcapital left femoral neck fracture.   Original Report Authenticated By: Gaylyn Rong, M.D.   Dg Hip Operative Left  01/15/2013   *RADIOLOGY REPORT*  Clinical Data: Femoral fixation.  OPERATIVE LEFT HIP  Comparison: 01/13/2013  Findings: Two intraoperative views which demonstrate  screw fixation of the proximal left femur. No acute hardware complication. Subcapital femoral fracture is less well visualized today.  IMPRESSION: Intraoperative imaging of fixation of proximal left femur.   Original Report Authenticated By: Jeronimo Greaves, M.D.   Ct Head Wo Contrast and Ct Cervical Spine Wo Contrast  01/13/2013   *RADIOLOGY REPORT*  Clinical Data:  Fall.  Headaches  CT HEAD WITHOUT CONTRAST CT CERVICAL SPINE WITHOUT CONTRAST  Technique:  Multidetector CT imaging of the head and cervical spine was performed following the standard protocol without intravenous contrast.  Multiplanar CT image reconstructions of the cervical spine were also generated.  Comparison:  12/31/2008  CT HEAD  Findings: There is no focal liver abnormalities identified.  Low attenuation foci within the bilateral basal ganglia compatible with chronic lacunar infarcts.  There is prominence of the sulci and ventricles consistent with brain atrophy. There is no evidence for acute brain infarct, hemorrhage or mass.  The paranasal sinuses are clear.  The mastoid air cells are clear. The skull appears intact.  IMPRESSION:  1.  No acute intracranial abnormalities. 2.  Small vessel ischemic change and brain atrophy.  CT CERVICAL SPINE  Findings: Normal alignment of the cervical spine.  The vertebral body  heights are well preserved.  Multilevel disc space narrowing and ventral endplate spurring is noted consistent with degenerative disc disease.  There are low attenuation nodules identified within the left lobe of thyroid gland.  Calcified atherosclerotic disease is noted involving the carotid arteries.  IMPRESSION:  1.  No acute findings. 2.  Cervical spondylosis noted.   Original Report Authenticated By: Signa Kell, M.D.   Dg Pelvis Portable  01/15/2013   *RADIOLOGY REPORT*  Clinical Data: Postop for left hip pinning.  PORTABLE PELVIS  Comparison: 01/13/2013 and intraoperative imaging of same date.  Findings: Interval screw fixation of the previously described subcapital left femoral fracture. No acute hardware complication. Right hip arthroplasty incidentally noted.  IMPRESSION: Expected appearance after proximal left femoral fixation.   Original Report Authenticated By: Jeronimo Greaves, M.D.   Dg Hip Portable 1 View Left  01/15/2013   *RADIOLOGY REPORT*  Clinical Data: Postop of left hip fixation.  PORTABLE LEFT HIP - 1 VIEW  Comparison: Intraoperative imaging of earlier today and preoperative imaging of 01/13/2013  Findings: Single lateral view of the left hip which demonstrates screw fixation of the proximal left femoral fracture.  The femoral head and proximal portion of the neck are not included on this lateral view.  IMPRESSION: Limited lateral imaging demonstrating screw fixation of the previously described femoral neck fracture.   Original Report Authenticated By: Jeronimo Greaves, M.D.   Dg Swallowing Func-speech Pathology  01/16/2013   Riley Nearing [place]blois, CCC-SLP     01/16/2013  3:12 PM Objective Swallowing Evaluation: Modified Barium Swallowing Study   Patient Details  Name: Jasime Westergren MRN: [id number] Date of Birth: [birth year]  Today's Date: 01/16/2013 Time: 5784-6962 SLP Time Calculation (min): 25 min  Past Medical History:  Past Medical History  Diagnosis Date  . Coronary artery disease     s/p  PCI in 1999 with peri-cath CVA, previous stress test  which  showed ischemia in the inferior and inferolateral wall--> refused  cardiac catheterization and has been treated medically though  does not want to take statin or ASA  . CVA (cerebral vascular accident)   . GERD (gastroesophageal reflux disease)   . Hypertension   . Femoral neck fracture 12/2008    right  . Asthma   .  Hyperlipidemia   . Breast cancer   . Cryptogenic cirrhosis     Dr. Arlyce Dice  . GI bleed Mar 2012    transfused 4 units PRBCs EGD showed bleed at duodenal bulb.   Plavix was D/C at this time.    Past Surgical History:  Past Surgical History  Procedure Laterality Date  . Hemiarthroplasty hip    . Cardiac catheterization    . Bilateral mastectomy    . Abdominal hysterectomy    . Laparotomy  10/29/2011    Procedure: EXPLORATORY LAPAROTOMY;  Surgeon: Robyne Askew,  MD;  Location: [facility] OR;  Service: General;  Laterality: N/A;   EXPLORATORY LAPAROTOMY, LYSIS OF ADHESIONS, PERITONEAL WASHINGS.   HPI:  Ms. Baik is an 79yo woman with PMH of CAD s/p stent in 1999,  peri cath CVA, asthma, HTN, HLD, cirrhosis, Rt hip replacement,  CKD, h/o GI bleeding who presents after a fall. She fell and fx  her left hip, underwent repair on 7/7. Apparently for the last  3-6 days pta Ms. Pontillo had been more somnolent and groggy, but  able to move around with her walker and interact.  Of note, over  the last 3 days pta, Ms. Stamper had been complaining of more pain  and taking aleve twice a day. She takes reflux medication, and pt  and husband report history of trouble swallowing and about 3  episodes a week of emesis/coughing up food. CXR on admission  showed RLL pna which could represent aspiration pneumonitis.  Records show an MBS in 2002 with no aspiration, but documented  distal esophageal narrowing and a history of DISTAL ESOPHAGEAL  STRICTURE.  HISTORY OF FUNDOPLICATION FOR HIATAL HERNIA IN 1997.     Assessment / Plan / Recommendation Clinical Impression  Dysphagia  Diagnosis: Mild oral phase dysphagia;Mild pharyngeal  phase dysphagia;Mild cervical esophageal phase dysphagia Clinical impression: Pt demonstrates a mild oral dysphagia  related to loose dentition. She demonstrates ability to [facility]  very soft solids without significant difficulty. There is a mild  delay in swallow initiation that results in trace flash  penetration. No aspiraiton of thin liquids observed during test.  After fluoro turned off, pt given water resulting in throat  clear. Suspect frank penetration in this case, but with  successful sensation and expulsion. Esophageal function appeared  [facility] though a bite of puree is required to transit pills.   Suspect that when pt is lethargic, has poor positioning or is  particularly weak, episodes of aspiration could occur. Recommend  pt initiate a dys 2/thin liquid diet with full supervision and  aspiration precautions.  SLP will follow for tolerance and  precautions.      Treatment Recommendation  Therapy as outlined in treatment plan below    Diet Recommendation Dysphagia 2 (Fine chop);Thin liquid   Liquid Administration via: Cup;Straw Medication Administration: Whole meds with puree Supervision: Full supervision/cueing for compensatory  strategies;Staff feed patient Compensations: Slow rate;Small sips/bites;Follow solids with  liquid Postural Changes and/or Swallow Maneuvers: Seated upright 90  degrees;Upright 30-60 min after meal;Out of bed for meals    Other  Recommendations Recommended Consults: MBS Oral Care Recommendations: Oral care BID   Follow Up Recommendations  Skilled Nursing facility    Frequency and Duration min 2x/week  2 weeks   Pertinent Vitals/Pain NA    SLP Swallow Goals     General HPI: Ms. Bouvier is an 79yo woman with PMH of CAD s/p  stent in 1999, peri cath CVA, asthma, HTN,  HLD, cirrhosis, Rt hip  replacement, CKD, h/o GI bleeding who presents after a fall. She  fell and fx her left hip, underwent repair on 7/7. Apparently for  the last  3-6 days pta Ms. Kimbrell had been more somnolent and  groggy, but able to move around with her walker and interact.  Of  note, over the last 3 days pta, Ms. Pamintuan had been complaining  of more pain and taking aleve twice a day. She takes reflux  medication, and pt and husband report history of trouble  swallowing and about 3 episodes a week of emesis/coughing up  food. CXR on admission showed RLL pna which could represent  aspiration pneumonitis. Records show an MBS in 2002 with no  aspiration, but documented distal esophageal narrowing and a  history of DISTAL ESOPHAGEAL STRICTURE.  HISTORY OF  FUNDOPLICATION FOR HIATAL HERNIA IN 1997. Type of Study: Modified Barium Swallowing Study Reason for Referral: Objectively evaluate swallowing function Previous Swallow Assessment: MBS 2002 regular thin Diet Prior to this Study: Dysphagia 1 (puree) Temperature Spikes Noted: No Respiratory Status: Supplemental O2 delivered via (comment) History of Recent Intubation: Yes Length of Intubations (days): 1 days Date extubated: 01/15/13 Behavior/Cognition: Alert;Confused;Cooperative Oral Cavity - Dentition: Dentures, top;Dentures, bottom Oral Motor / Sensory Function: Within functional limits Self-Feeding Abilities: Needs assist Patient Positioning: Upright in chair Baseline Vocal Quality: Clear Volitional Cough: Strong Volitional Swallow: Able to elicit Anatomy: Within functional limits Pharyngeal Secretions: Not observed secondary MBS    Reason for Referral Objectively evaluate swallowing function   Oral Phase Oral Preparation/Oral Phase Oral Phase: Impaired Oral - Thin Oral - Thin Cup: Holding of bolus Oral - Thin Straw: Holding of bolus Oral - Solids Oral - Puree: Holding of bolus Oral - Regular: Impaired mastication (loose dentures) Oral - Pill: Within functional limits   Pharyngeal Phase Pharyngeal Phase Pharyngeal Phase: Impaired Pharyngeal - Thin Pharyngeal - Thin Cup: Delayed swallow  initiation;Penetration/Aspiration  before swallow Penetration/Aspiration details (thin cup): Material enters  airway, remains ABOVE vocal cords then ejected out Pharyngeal - Thin Straw: Delayed swallow  initiation;Penetration/Aspiration before swallow Penetration/Aspiration details (thin straw): Material enters  airway, remains ABOVE vocal cords then ejected out Pharyngeal - Solids Pharyngeal - Puree: Within functional limits Pharyngeal - Mechanical Soft: Within functional limits Pharyngeal - Pill: Within functional limits  Cervical Esophageal Phase    GO    Cervical Esophageal Phase Cervical Esophageal Phase: Impaired Cervical Esophageal Phase - Comment Cervical Esophageal Comment: Pill briefly lodged in proximal  esophagus, passed easily when given a bolus of puree, pill passes  through GE junction without difficulty.         Harlon Ditty, [place] CCC-SLP [phone]  Dyanne Iha Riley Nearing 01/16/2013, 3:11 PM     Admission HPI: Ebone Alcivar is a 79 year old female with PMH of CAD s/p stent placement in 1999 last echo in record was 2011 was 60%EF, peri cath CVA, Asthma, HTN, HLD, cirrhosis, right hip replacement, GI bleed, CKD, who presented to [facility] by EMS after fall this morning. She is accompanied by her husband who provides the majority of the history. The husband reports that for the past 6 days she has been "groggy" and acting "fuzzy," in addition she has had trouble sleeping and slept little over the past few nights. He reports she saw here PCP on Wednesday who told her to increase her lasix dose from 80mg  daily to 160mg  daily for 5 days. And to increase here home O2 use from 2L to  3L. On the night PTA he reports that she usually sleeps on the couch but that he gave her a milk shake and some ibuprofen and that she feel asleep in a recliner chair. He decided to sleep next to her on the couch. In the morning he reports he woke up to her out of the recliner trying to push the leg rest in from the front, he asked her what she was doing and at  that time she fell on her left side. He reports that she did not hit her head, or have any LOC. He was concerned about her and called EMS. When EMS arrived and tried to lift her to the stretcher she cried out and he realized she must have hurt her left hip. In the ED hip Xrays showed an acute subcapital left femoral neck fracture. No acute process on Head or C-Spine CT. In addition she was noted to be anemic with Hgb of 7, and decreased Cr from last record in April 2013. Husband denies seeing any blood in her recent stools, but that dark stools are common for her. She is not currently taking iron supplement.  She does have a follow up appointment scheduled with her PCP for Tuesday 7/8.   Physical Exam on admission:  Blood pressure 126/55, pulse 89, temperature 97.8 F (36.6 C), temperature source Oral, resp. rate 18, SpO2 100.00%.  Vitals Reviewed  General: resting in bed, slow to speak, short answers  HEENT: EOMI, no scleral icterus  Cardiac: RRR, no rubs, murmurs or gallops  Pulm: diffuse wheezing b/l  Abd: soft, LLQ tenderness, nondistended, BS normoactive  Ext: warm and well perfused, 2+ pulses b/l, no pedal edema  Neuro: alert and oriented to person, place, but not year (normal per husband), oriented to birth date. No focal neurologic deficits noted  Lab results on admission:  Basic Metabolic Panel:    01/13/13 1224   NA  135   K  3.9   CL  96   CO2  31   GLUCOSE  109*   BUN  21   CREATININE  1.10   CALCIUM  8.3*    CBC:   01/13/13 1224   WBC  6.8   HGB  7.0*   HCT  21.4*   MCV  65.8*   PLT  165    Urinalysis:    01/13/13 1409   COLORURINE  YELLOW   LABSPEC  1.013   PHURINE  5.5   GLUCOSEU  NEGATIVE   HGBUR  NEGATIVE   BILIRUBINUR  NEGATIVE   KETONESUR  NEGATIVE   PROTEINUR  NEGATIVE   UROBILINOGEN  0.2   NITRITE  NEGATIVE   LEUKOCYTESUR  [facility] Course by problem list:   Fracture of femoral neck, left Patient presented after witnessed  mechanical fall onto left side.  Was found in ED to have a left femoral subcapital fracture.  Orthopeadics was consulted and after patient was medically stabilized and risk stratified by cardiology the patient and husband decided to undergo less invase surgery with cannulated pinning.  The surgery was completed on 7/7 by Dr. Dion Saucier without complication.  Patient continued to do well and was discharged to SNF on 01/17/13.   Symptomatic Microcytic anemia Patient reported a history of anemia and husband reported that she has had darker stools in the past few weeks.  Patients PMH was notable for a duodenal polyp that caused a significant anemia in 2012 that required her to be  transfused 4 units of PRBC.  On this admission patient was noted to be acting differently in the week PTA and had a mechanical fall.  Her Hgb on admission was 7.6, repeat Hgb dropped to 7.0 and the patient was transfused 2 units of PRBC which brought her Hgb to 10.  Her Hgb continued to be monitored and no signs of active bleeding were found.  Her Hgb did drop to 8.9 on discharge and Iron supplements were initiated.  A recommendation of EGD and GI referral as outpatient was given.   CAD (coronary artery disease) Patient with history of CAD s/p stent placement in 1999, complicated by a peri cath CVA.  A follow up lexiscan showed some areas of hypoperfusion but declined a second stent placement.  Patient reports a history of lifelong HLD but has decided to no longer wish to take lipid lowering medications, BB, or Aspirin.   Cirrhosis of liver   Cryptogenic cirrhosis of liver was diagnosed on her previous admission.  Marland Kitchen  Her hepatic function was monitored throughout her stay, her INR was 1.51. She also has a history and symptoms of COPD despite denying a history of smoking, she may have alpha 1 antitrypsin deficiency which would be consistent with this clinical picture.   NSTEMI (non-ST elevated myocardial infarction) Patient had significant  history of CAD.  On presentation and throughout her hospitalization the patient denied chest pain.  Because of the patient's risk and symptomatic anemia 3 sets of cardiac enzymes were ordered.  All three troponin's were positive at 1.06, 1.16. And 0.93.  An EKG was obtained which showed no ST elevation.  This was thought to be caused by demand ischemia.  Cardiology was consulted to risk stratify patient for surgery and informed patient she was at a higher than normal risk but could proceed with a pinning procedure.   Renal insufficiency Patient had good renal function in previous hospital stay in April 2014.  On this admission her creatinine was 1.1 on admission indicating a GFR of 52.  This was thought to be prerenal and may have been contributed to by increased lasix dose recently.  But could also be a progression of some chronic kidney disease.  Creatinine was trended during hospitalization and remained relatively stable despite holding lasix and gently hydrating with IVF.  Will recommend F/U BMet by PCP to see improvement in kidney function.   Protein-calorie malnutrition, severe/ Dysphasia Patient admitted poor PO intake in week prior to admission and reported she coughs up or vomits food 2-3 times a week.  CKX obtained in ED was concerning for a RLL infiltrate that may represent aspiration pneumonitis, There was a concern for dysphasia and SLP was consulted who did a swallow eval and MBS and determined she had Mild oral phase dysphagia;Mild pharyngeal phase dysphagia and Mild cervical esophageal phase dysphagia.  She was placed on a dysphasia 2 diet (thin liquids diet).  She appeared to be malnoursead and nutrition consult was ordered who recommended adding Ensure Complete po BID to diet.       Discharge Vitals:   BP 111/48  Pulse 66  Temp(Src) 97.7 F (36.5 C) (Oral)  Resp 16  Ht 5' 2.99" (1.6 m)  Wt 119 lb 1.6 oz (54.023 kg)  BMI 21.1 kg/m2  SpO2 98%  Discharge Labs:  Results for orders  placed during the hospital encounter of 01/13/13 (from the past 24 hour(s))  CBC     Status: Abnormal   Collection Time  01/17/13  4:50 AM      Result Value Range   WBC 6.9  4.0 - 10.5 K/uL   RBC 3.87  3.87 - 5.11 MIL/uL   Hemoglobin 8.8 (*) 12.0 - 15.0 g/dL   HCT 16.1 (*) 09.6 - 04.5 %   MCV 69.8 (*) 78.0 - 100.0 fL   MCH 22.7 (*) 26.0 - 34.0 pg   MCHC 32.6  30.0 - 36.0 g/dL   RDW 40.9 (*) 81.1 - 91.4 %   Platelets 171  150 - 400 K/uL  BASIC METABOLIC PANEL     Status: Abnormal   Collection Time    01/17/13  4:50 AM      Result Value Range   Sodium 135  135 - 145 mEq/L   Potassium 3.5  3.5 - 5.1 mEq/L   Chloride 100  96 - 112 mEq/L   CO2 29  19 - 32 mEq/L   Glucose, Bld 94  70 - 99 mg/dL   BUN 32 (*) 6 - 23 mg/dL   Creatinine, Ser 7.82  0.50 - 1.10 mg/dL   Calcium 8.2 (*) 8.4 - 10.5 mg/dL   GFR calc non Af Amer 47 (*) >90 mL/min   GFR calc Af Amer 55 (*) >90 mL/min    Signed: Carlynn Purl, DO 01/17/2013, 1:58 PM   Time Spent on Discharge: 50 minutes Services Ordered on Discharge: SNF Equipment Ordered on Discharge: none

## 2013-01-16 NOTE — Procedures (Signed)
Objective Swallowing Evaluation: Modified Barium Swallowing Study  Patient Details  Name: [name] MRN: [id number] Date of Birth: 11-20-1928  Today's Date: 01/16/2013 Time: 4098-1191 SLP Time Calculation (min): 25 min  Past Medical History:  Past Medical History  Diagnosis Date  . Coronary artery disease     s/p PCI in 1999 with peri-cath CVA, previous stress test  which showed ischemia in the inferior and inferolateral wall--> refused cardiac catheterization and has been treated medically though does not want to take statin or ASA  . CVA (cerebral vascular accident)   . GERD (gastroesophageal reflux disease)   . Hypertension   . Femoral neck fracture 12/2008    right  . Asthma   . Hyperlipidemia   . Breast cancer   . Cryptogenic cirrhosis     Dr. Arlyce Dice  . GI bleed Mar 2012    transfused 4 units PRBCs EGD showed bleed at duodenal bulb.  Plavix was D/C at this time.    Past Surgical History:  Past Surgical History  Procedure Laterality Date  . Hemiarthroplasty hip    . Cardiac catheterization    . Bilateral mastectomy    . Abdominal hysterectomy    . Laparotomy  10/29/2011    Procedure: EXPLORATORY LAPAROTOMY;  Surgeon: Robyne Askew, MD;  Location: [facility] OR;  Service: General;  Laterality: N/A;  EXPLORATORY LAPAROTOMY, LYSIS OF ADHESIONS, PERITONEAL WASHINGS.   HPI:  [name] is an 79yo woman with PMH of CAD s/p stent in 1999, peri cath CVA, asthma, HTN, HLD, cirrhosis, Rt hip replacement, CKD, h/o GI bleeding who presents after a fall. She fell and fx her left hip, underwent repair on 7/7. Apparently for the last 3-6 days pta Ms. Hagerty had been more somnolent and groggy, but able to move around with her walker and interact.  Of note, over the last 3 days pta, Ms. Lalla had been complaining of more pain and taking aleve twice a day. She takes reflux medication, and pt and husband report history of trouble swallowing and about 3 episodes a week of emesis/coughing up food.  CXR on admission showed RLL pna which could represent aspiration pneumonitis. Records show an MBS in 2002 with no aspiration, but documented distal esophageal narrowing and a history of DISTAL ESOPHAGEAL STRICTURE.  HISTORY OF FUNDOPLICATION FOR HIATAL HERNIA IN 1997.     Assessment / Plan / Recommendation Clinical Impression  Dysphagia Diagnosis: Mild oral phase dysphagia;Mild pharyngeal phase dysphagia;Mild cervical esophageal phase dysphagia Clinical impression: Pt demonstrates a mild oral dysphagia related to loose dentition. She demonstrates ability to [facility] very soft solids without significant difficulty. There is a mild delay in swallow initiation that results in trace flash penetration. No aspiraiton of thin liquids observed during test. After fluoro turned off, pt given water resulting in throat clear. Suspect frank penetration in this case, but with successful sensation and expulsion. Esophageal function appeared [facility] though a bite of puree is required to transit pills.   Suspect that when pt is lethargic, has poor positioning or is particularly weak, episodes of aspiration could occur. Recommend pt initiate a dys 2/thin liquid diet with full supervision and aspiration precautions.  SLP will follow for tolerance and precautions.      Treatment Recommendation  Therapy as outlined in treatment plan below    Diet Recommendation Dysphagia 2 (Fine chop);Thin liquid   Liquid Administration via: Cup;Straw Medication Administration: Whole meds with puree Supervision: Full supervision/cueing for compensatory strategies;Staff feed patient Compensations: Slow rate;Small sips/bites;Follow  solids with liquid Postural Changes and/or Swallow Maneuvers: Seated upright 90 degrees;Upright 30-60 min after meal;Out of bed for meals    Other  Recommendations Recommended Consults: MBS Oral Care Recommendations: Oral care BID   Follow Up Recommendations  Skilled Nursing facility    Frequency and  Duration min 2x/week  2 weeks   Pertinent Vitals/Pain NA    SLP Swallow Goals     General HPI: [name] is an 79yo woman with PMH of CAD s/p stent in 1999, peri cath CVA, asthma, HTN, HLD, cirrhosis, Rt hip replacement, CKD, h/o GI bleeding who presents after a fall. She fell and fx her left hip, underwent repair on 7/7. Apparently for the last 3-6 days pta Ms. Guay had been more somnolent and groggy, but able to move around with her walker and interact.  Of note, over the last 3 days pta, Ms. Hofacker had been complaining of more pain and taking aleve twice a day. She takes reflux medication, and pt and husband report history of trouble swallowing and about 3 episodes a week of emesis/coughing up food. CXR on admission showed RLL pna which could represent aspiration pneumonitis. Records show an MBS in 2002 with no aspiration, but documented distal esophageal narrowing and a history of DISTAL ESOPHAGEAL STRICTURE.  HISTORY OF FUNDOPLICATION FOR HIATAL HERNIA IN 1997. Type of Study: Modified Barium Swallowing Study Reason for Referral: Objectively evaluate swallowing function Previous Swallow Assessment: MBS 2002 regular thin Diet Prior to this Study: Dysphagia 1 (puree) Temperature Spikes Noted: No Respiratory Status: Supplemental O2 delivered via (comment) History of Recent Intubation: Yes Length of Intubations (days): 1 days Date extubated: 01/15/13 Behavior/Cognition: Alert;Confused;Cooperative Oral Cavity - Dentition: Dentures, top;Dentures, bottom Oral Motor / Sensory Function: Within functional limits Self-Feeding Abilities: Needs assist Patient Positioning: Upright in chair Baseline Vocal Quality: Clear Volitional Cough: Strong Volitional Swallow: Able to elicit Anatomy: Within functional limits Pharyngeal Secretions: Not observed secondary MBS    Reason for Referral Objectively evaluate swallowing function   Oral Phase Oral Preparation/Oral Phase Oral Phase: Impaired Oral  - Thin Oral - Thin Cup: Holding of bolus Oral - Thin Straw: Holding of bolus Oral - Solids Oral - Puree: Holding of bolus Oral - Regular: Impaired mastication (loose dentures) Oral - Pill: Within functional limits   Pharyngeal Phase Pharyngeal Phase Pharyngeal Phase: Impaired Pharyngeal - Thin Pharyngeal - Thin Cup: Delayed swallow initiation;Penetration/Aspiration before swallow Penetration/Aspiration details (thin cup): Material enters airway, remains ABOVE vocal cords then ejected out Pharyngeal - Thin Straw: Delayed swallow initiation;Penetration/Aspiration before swallow Penetration/Aspiration details (thin straw): Material enters airway, remains ABOVE vocal cords then ejected out Pharyngeal - Solids Pharyngeal - Puree: Within functional limits Pharyngeal - Mechanical Soft: Within functional limits Pharyngeal - Pill: Within functional limits  Cervical Esophageal Phase    GO    Cervical Esophageal Phase Cervical Esophageal Phase: Impaired Cervical Esophageal Phase - Comment Cervical Esophageal Comment: Pill briefly lodged in proximal esophagus, passed easily when given a bolus of puree, pill passes through GE junction without difficulty.         Harlon Ditty, MA CCC-SLP [phone]  Claudine Mouton 01/16/2013, 3:11 PM

## 2013-01-16 NOTE — Progress Notes (Signed)
UR COMPLETED  

## 2013-01-17 LAB — CBC
MCH: 22.7 pg — ABNORMAL LOW (ref 26.0–34.0)
MCHC: 32.6 g/dL (ref 30.0–36.0)
Platelets: 171 10*3/uL (ref 150–400)
RDW: 20.8 % — ABNORMAL HIGH (ref 11.5–15.5)

## 2013-01-17 LAB — BASIC METABOLIC PANEL
Calcium: 8.2 mg/dL — ABNORMAL LOW (ref 8.4–10.5)
GFR calc Af Amer: 55 mL/min — ABNORMAL LOW (ref >90)
GFR calc non Af Amer: 47 mL/min — ABNORMAL LOW (ref >90)
Glucose, Bld: 94 mg/dL (ref 70–99)
Potassium: 3.5 mEq/L (ref 3.5–5.1)
Sodium: 135 mEq/L (ref 135–145)

## 2013-01-17 MED ORDER — POLYETHYLENE GLYCOL 3350 17 G PO PACK: 17.0000 g | PACK | Freq: Every day | ORAL | Status: AC | PRN

## 2013-01-17 MED ORDER — DSS 100 MG PO CAPS: 100.0000 mg | ORAL_CAPSULE | Freq: Two times a day (BID) | ORAL | Status: AC

## 2013-01-17 MED ORDER — METOPROLOL SUCCINATE 12.5 MG HALF TABLET: 12.5000 mg | ORAL_TABLET | Freq: Every day | ORAL | Status: AC

## 2013-01-17 MED ORDER — BISACODYL 10 MG RE SUPP: 10.0000 mg | Freq: Every day | RECTAL | Status: AC | PRN

## 2013-01-17 MED ORDER — FERROUS SULFATE 325 (65 FE) MG PO TABS: 325.0000 mg | ORAL_TABLET | Freq: Three times a day (TID) | ORAL | Status: AC

## 2013-01-17 MED ORDER — PANTOPRAZOLE SODIUM 40 MG PO TBEC
40.0000 mg | DELAYED_RELEASE_TABLET | Freq: Two times a day (BID) | ORAL | Status: DC
  Administered 2013-01-17: 40 mg via ORAL
  Filled 2013-01-17: qty 1

## 2013-01-17 NOTE — Progress Notes (Signed)
Physical Therapy Treatment Patient Details Name: [name] MRN: [id number] DOB: July 12, 1929 Today's Date: 01/17/2013 Time: 4098-1191 PT Time Calculation (min): 22 min  PT Assessment / Plan / Recommendation  PT Comments   Excellent progress with gait and distance ambulated; For dc to SNF for rehab   Follow Up Recommendations  SNF;Supervision/Assistance - 24 hour     Does the patient have the potential to tolerate intense rehabilitation     Barriers to Discharge        Equipment Recommendations  None recommended by PT    Recommendations for Other Services OT consult  Frequency Min 6X/week   Progress towards PT Goals Progress towards PT goals: Progressing toward goals  Plan Current plan remains appropriate    Precautions / Restrictions Precautions Precautions: Fall Restrictions LLE Weight Bearing: Weight bearing as tolerated   Pertinent Vitals/Pain Grimace with movement, unable to rate pain patient repositioned for comfort     Mobility  Transfers Transfers: Sit to Stand;Stand to Sit Sit to Stand: 4: Min assist;With upper extremity assist;From bed Stand to Sit: 4: Min assist;To chair/3-in-1 Details for Transfer Assistance: cues for hand placement. Ambulation/Gait Ambulation/Gait Assistance: 3: Mod assist Ambulation Distance (Feet): 40 Feet Assistive device: Rolling walker Ambulation/Gait Assistance Details: Cues for gait sequence and physical assist to advance RW Gait Pattern: Step-to pattern (emerging step-through)    Exercises     PT Diagnosis:    PT Problem List:   PT Treatment Interventions:     PT Goals (current goals can now be found in the care plan section) Acute Rehab PT Goals Patient Stated Goal: did not state PT Goal Formulation: With patient/family Time For Goal Achievement: 01/30/13 Potential to Achieve Goals: Good  Visit Information  Last PT Received On: 01/17/13 Assistance Needed: +1 History of Present Illness: Fall resulting in L hip  fx; s/p ORIF; WBAT    Subjective Data  Subjective: max encouragement to participate Patient Stated Goal: did not state   Cognition  Cognition Arousal/Alertness: Awake/alert Behavior During Therapy: WFL for tasks assessed/performed Overall Cognitive Status: Difficult to assess Difficult to assess due to: Hard of hearing/deaf    Balance     End of Session PT - End of Session Equipment Utilized During Treatment: Gait belt Activity Tolerance: Patient tolerated treatment well Patient left: in chair;with call bell/phone within reach;with family/visitor present   GP     Van Clines [facility] [place], [place] [phone]  01/17/2013, 3:58 PM

## 2013-01-17 NOTE — Progress Notes (Signed)
  Date: 01/17/2013  Patient name: [name]  Medical record number: [id number]  Date of birth: March 05, 1929   This patient has been seen and the plan of care was discussed with the house staff. Please see their note for complete details. I concur with their findings with the following additions/corrections:  Doing well post op. Surgical site looks c/d/i. Appreciate SLP consultation, she likely has degree of aspiration and needs dysphagia 2 diet with thin liquids, needs assistance and aspiration precautions, explained to her husband as well. She will need to follow up with her cardiologist s/p NSTEMI. She is POD #2 L hip pinning.  She needs outpatient GI consultation for evaluation of microcytic anemia, she has a hx of distal esophageal stricture and fundoplication. H/H is fairly stable, I do not see evidence of active bleeding.  Follow up with PCP as well. Stable for transfer to rehab.  [name], DO 01/17/2013, 1:47 PM

## 2013-01-17 NOTE — Progress Notes (Signed)
Speech Language Pathology Dysphagia Treatment Patient Details Name: [name] MRN: [id number] DOB: 1929-07-02 Today's Date: 01/17/2013 Time: 3086-5784 SLP Time Calculation (min): 15 min  Assessment / Plan / Recommendation Clinical Impression  Pt alert, upright in chiar; demonstrated adequate tolerance of thin liquids. Continues to have mild difficulty with dry solids. Hard cough with saltine; dentures are very loose. Recommend pt d/c with current diet to SNF with SLP to f/u for precautions. Disucssed with pt and caregiver.     Diet Recommendation  Continue with Current Diet: Dysphagia 2 (fine chop);Thin liquid    SLP Plan Continue with current plan of care   Pertinent Vitals/Pain NA   Swallowing Goals  SLP Swallowing Goals Patient will utilize recommended strategies during swallow to increase swallowing safety with: Moderate assistance Swallow Study Goal #2 - Progress: Progressing toward goal  General Temperature Spikes Noted: No Respiratory Status: Supplemental O2 delivered via (comment) Behavior/Cognition: Alert;Confused;Cooperative Oral Cavity - Dentition: Dentures, top;Dentures, bottom Patient Positioning: Upright in chair  Oral Cavity - Oral Hygiene Does patient have any of the following "at risk" factors?: None of the above Brush patient's teeth BID with toothbrush (using toothpaste with fluoride): Yes   Dysphagia Treatment Treatment focused on: Skilled observation of diet tolerance;Upgraded PO texture trials;Patient/family/caregiver education Family/Caregiver Educated: family Treatment Methods/Modalities: Skilled observation Patient observed directly with PO's: Yes Type of PO's observed: Dysphagia 3 (soft);Thin liquids Feeding: Needs assist Liquids provided via: Straw Oral Phase Signs & Symptoms: Prolonged oral phase;Prolonged bolus formation Pharyngeal Phase Signs & Symptoms: Immediate cough Type of cueing: Verbal Amount of cueing: Minimal   GO    Harlon Ditty, MA CCC-SLP [phone]  Claudine Mouton 01/17/2013, 2:13 PM

## 2013-01-17 NOTE — Progress Notes (Signed)
Subjective: Nurses report patient has been at times confused, but not lethargic. Patient was noted to not be passing much urine and a bladder scan was preformed which showed , she was subsequently straight cathed and of urine were obtained. Patient states that she is doing well, she was coughing during exam, but reported that she has not coughed much overnight.  She denied any SOB, or wheezing.  Denied F/C, weakness, dizziness. Objective: Vital signs in last 24 hours: Filed Vitals:   01/17/13 0550 01/17/13 0814 01/17/13 1010 01/17/13 1300  BP: 130/65  130/60 111/48  Pulse: 80  72 66  Temp: 97.8 F (36.6 C)   97.7 F (36.5 C)  TempSrc:      Resp: 16   16  Height:      Weight:      SpO2: 94% 92%  98%   Weight change:   Intake/Output Summary (Last 24 hours) at 01/17/13 1421 Last data filed at 01/17/13 1358  Gross per 24 hour  Intake    243 ml  Output    770 ml  Net   -527 ml   General: resting confortably in bed on room air. HEENT: EOMI, no scleral icterus  Cardiac: RRR, no rubs, murmurs or gallops  Pulm: mild expiratory wheezing B/L Abd: soft, nontender, nondistended, BS normoactive  Ext: warm and well perfused, no pedal edema, mild tenderness to palpation of left hip, bandage is c/d/i. Excoriations noted on left leg, are improving Neuro: alert and oriented to person, place [facility]), and [phone]), not to month or day, cranial nerves II-XII grossly intact  Lab Results: Basic Metabolic Panel:  Recent Labs Lab 01/14/13 0701  01/16/13 0622 01/17/13 0450  NA 137  < > 136 135  K 3.9  < > 4.0 3.5  CL 97  < > 98 100  CO2 30  < > 26 29  GLUCOSE 133*  < > 102* 94  BUN 23  < > 35* 32*  CREATININE 1.12*  < > 1.19* 1.07  CALCIUM 8.5  < > 8.4 8.2*  MG 2.1  --   --   --   < > = values in this interval not displayed. Liver Function Tests:  Recent Labs Lab 01/14/13 0701 01/15/13 0010  AST 23 29  ALT 11 10  ALKPHOS 70 59  BILITOT 3.7* 0.8  PROT 6.6 6.0    ALBUMIN 3.1* 2.5*   CBC:  Recent Labs Lab 01/16/13 0622 01/17/13 0450  WBC 8.0 6.9  HGB 9.2* 8.8*  HCT 28.5* 27.0*  MCV 70.2* 69.8*  PLT 170 171   Cardiac Enzymes:  Recent Labs Lab 01/14/13 0700 01/14/13 1235 01/15/13 0009  TROPONINI 1.06* 1.16* 0.93*   Coagulation:  Recent Labs Lab 01/14/13 0700  LABPROT 17.8*  INR 1.51*   Urinalysis:  Recent Labs Lab 01/13/13 1409  COLORURINE YELLOW  LABSPEC 1.013  PHURINE 5.5  GLUCOSEU NEGATIVE  HGBUR NEGATIVE  BILIRUBINUR NEGATIVE  KETONESUR NEGATIVE  PROTEINUR NEGATIVE  UROBILINOGEN 0.2  NITRITE NEGATIVE  LEUKOCYTESUR MODERATE*    Micro Results: Recent Results (from the past 240 hour(s))  MRSA PCR SCREENING     Status: None   Collection Time    01/14/13  6:06 PM      Result Value Range Status   MRSA by PCR NEGATIVE  NEGATIVE Final   Comment:            The GeneXpert MRSA Assay (FDA     approved for NASAL specimens  only), is one component of a     comprehensive MRSA colonization     surveillance program. It is not     intended to diagnose MRSA     infection nor to guide or     monitor treatment for     MRSA infections.   Studies/Results: Dg Hip Operative Left  01/15/2013   *RADIOLOGY REPORT*  Clinical Data: Femoral fixation.  OPERATIVE LEFT HIP  Comparison: 01/13/2013  Findings: Two intraoperative views which demonstrate screw fixation of the proximal left femur. No acute hardware complication. Subcapital femoral fracture is less well visualized today.  IMPRESSION: Intraoperative imaging of fixation of proximal left femur.   Original Report Authenticated By: Jeronimo Greaves, M.D.   Dg Pelvis Portable  01/15/2013   *RADIOLOGY REPORT*  Clinical Data: Postop for left hip pinning.  PORTABLE PELVIS  Comparison: 01/13/2013 and intraoperative imaging of same date.  Findings: Interval screw fixation of the previously described subcapital left femoral fracture. No acute hardware complication. Right hip arthroplasty  incidentally noted.  IMPRESSION: Expected appearance after proximal left femoral fixation.   Original Report Authenticated By: Jeronimo Greaves, M.D.   Dg Hip Portable 1 View Left  01/15/2013   *RADIOLOGY REPORT*  Clinical Data: Postop of left hip fixation.  PORTABLE LEFT HIP - 1 VIEW  Comparison: Intraoperative imaging of earlier today and preoperative imaging of 01/13/2013  Findings: Single lateral view of the left hip which demonstrates screw fixation of the proximal left femoral fracture.  The femoral head and proximal portion of the neck are not included on this lateral view.  IMPRESSION: Limited lateral imaging demonstrating screw fixation of the previously described femoral neck fracture.   Original Report Authenticated By: Jeronimo Greaves, M.D.   Dg Swallowing Func-speech Pathology  01/16/2013   Riley Nearing [place], CCC-SLP     01/16/2013  3:12 PM Objective Swallowing Evaluation: Modified Barium Swallowing Study   Patient Details  Name: [name] MRN: [id number] Date of Birth: 03/30/1929  Today's Date: 01/16/2013 Time: 4098-1191 SLP Time Calculation (min): 25 min  Past Medical History:  Past Medical History  Diagnosis Date  . Coronary artery disease     s/p PCI in 1999 with peri-cath CVA, previous stress test  which  showed ischemia in the inferior and inferolateral wall--> refused  cardiac catheterization and has been treated medically though  does not want to take statin or ASA  . CVA (cerebral vascular accident)   . GERD (gastroesophageal reflux disease)   . Hypertension   . Femoral neck fracture 12/2008    right  . Asthma   . Hyperlipidemia   . Breast cancer   . Cryptogenic cirrhosis     Dr. Arlyce Dice  . GI bleed Mar 2012    transfused 4 units PRBCs EGD showed bleed at duodenal bulb.   Plavix was D/C at this time.    Past Surgical History:  Past Surgical History  Procedure Laterality Date  . Hemiarthroplasty hip    . Cardiac catheterization    . Bilateral mastectomy    . Abdominal hysterectomy    .  Laparotomy  10/29/2011    Procedure: EXPLORATORY LAPAROTOMY;  Surgeon: Robyne Askew,  MD;  Location: [facility] OR;  Service: General;  Laterality: N/A;   EXPLORATORY LAPAROTOMY, LYSIS OF ADHESIONS, PERITONEAL WASHINGS.   HPI:  Ms. Sterba is an 79yo woman with PMH of CAD s/p stent in 1999,  peri cath CVA, asthma, HTN, HLD, cirrhosis, Rt hip replacement,  CKD, h/o GI bleeding  who presents after a fall. She fell and fx  her left hip, underwent repair on 7/7. Apparently for the last  3-6 days pta Ms. Mitzel had been more somnolent and groggy, but  able to move around with her walker and interact.  Of note, over  the last 3 days pta, Ms. Granzow had been complaining of more pain  and taking aleve twice a day. She takes reflux medication, and pt  and husband report history of trouble swallowing and about 3  episodes a week of emesis/coughing up food. CXR on admission  showed RLL pna which could represent aspiration pneumonitis.  Records show an MBS in 2002 with no aspiration, but documented  distal esophageal narrowing and a history of DISTAL ESOPHAGEAL  STRICTURE.  HISTORY OF FUNDOPLICATION FOR HIATAL HERNIA IN 1997.     Assessment / Plan / Recommendation Clinical Impression  Dysphagia Diagnosis: Mild oral phase dysphagia;Mild pharyngeal  phase dysphagia;Mild cervical esophageal phase dysphagia Clinical impression: Pt demonstrates a mild oral dysphagia  related to loose dentition. She demonstrates ability to [facility]  very soft solids without significant difficulty. There is a mild  delay in swallow initiation that results in trace flash  penetration. No aspiraiton of thin liquids observed during test.  After fluoro turned off, pt given water resulting in throat  clear. Suspect frank penetration in this case, but with  successful sensation and expulsion. Esophageal function appeared  [facility] though a bite of puree is required to transit pills.   Suspect that when pt is lethargic, has poor positioning or is  particularly weak,  episodes of aspiration could occur. Recommend  pt initiate a dys 2/thin liquid diet with full supervision and  aspiration precautions.  SLP will follow for tolerance and  precautions.      Treatment Recommendation  Therapy as outlined in treatment plan below    Diet Recommendation Dysphagia 2 (Fine chop);Thin liquid   Liquid Administration via: Cup;Straw Medication Administration: Whole meds with puree Supervision: Full supervision/cueing for compensatory  strategies;Staff feed patient Compensations: Slow rate;Small sips/bites;Follow solids with  liquid Postural Changes and/or Swallow Maneuvers: Seated upright 90  degrees;Upright 30-60 min after meal;Out of bed for meals    Other  Recommendations Recommended Consults: MBS Oral Care Recommendations: Oral care BID   Follow Up Recommendations  Skilled Nursing facility    Frequency and Duration min 2x/week  2 weeks   Pertinent Vitals/Pain NA    SLP Swallow Goals     General HPI: Ms. Livengood is an 79yo woman with PMH of CAD s/p  stent in 1999, peri cath CVA, asthma, HTN, HLD, cirrhosis, Rt hip  replacement, CKD, h/o GI bleeding who presents after a fall. She  fell and fx her left hip, underwent repair on 7/7. Apparently for  the last 3-6 days pta Ms. Tomkinson had been more somnolent and  groggy, but able to move around with her walker and interact.  Of  note, over the last 3 days pta, Ms. Botz had been complaining  of more pain and taking aleve twice a day. She takes reflux  medication, and pt and husband report history of trouble  swallowing and about 3 episodes a week of emesis/coughing up  food. CXR on admission showed RLL pna which could represent  aspiration pneumonitis. Records show an MBS in 2002 with no  aspiration, but documented distal esophageal narrowing and a  history of DISTAL ESOPHAGEAL STRICTURE.  HISTORY OF  FUNDOPLICATION FOR HIATAL HERNIA IN 1997. Type of Study: Modified  Barium Swallowing Study Reason for Referral: Objectively evaluate swallowing  function Previous Swallow Assessment: MBS 2002 regular thin Diet Prior to this Study: Dysphagia 1 (puree) Temperature Spikes Noted: No Respiratory Status: Supplemental O2 delivered via (comment) History of Recent Intubation: Yes Length of Intubations (days): 1 days Date extubated: 01/15/13 Behavior/Cognition: Alert;Confused;Cooperative Oral Cavity - Dentition: Dentures, top;Dentures, bottom Oral Motor / Sensory Function: Within functional limits Self-Feeding Abilities: Needs assist Patient Positioning: Upright in chair Baseline Vocal Quality: Clear Volitional Cough: Strong Volitional Swallow: Able to elicit Anatomy: Within functional limits Pharyngeal Secretions: Not observed secondary MBS    Reason for Referral Objectively evaluate swallowing function   Oral Phase Oral Preparation/Oral Phase Oral Phase: Impaired Oral - Thin Oral - Thin Cup: Holding of bolus Oral - Thin Straw: Holding of bolus Oral - Solids Oral - Puree: Holding of bolus Oral - Regular: Impaired mastication (loose dentures) Oral - Pill: Within functional limits   Pharyngeal Phase Pharyngeal Phase Pharyngeal Phase: Impaired Pharyngeal - Thin Pharyngeal - Thin Cup: Delayed swallow  initiation;Penetration/Aspiration before swallow Penetration/Aspiration details (thin cup): Material enters  airway, remains ABOVE vocal cords then ejected out Pharyngeal - Thin Straw: Delayed swallow  initiation;Penetration/Aspiration before swallow Penetration/Aspiration details (thin straw): Material enters  airway, remains ABOVE vocal cords then ejected out Pharyngeal - Solids Pharyngeal - Puree: Within functional limits Pharyngeal - Mechanical Soft: Within functional limits Pharyngeal - Pill: Within functional limits  Cervical Esophageal Phase    GO    Cervical Esophageal Phase Cervical Esophageal Phase: Impaired Cervical Esophageal Phase - Comment Cervical Esophageal Comment: Pill briefly lodged in proximal  esophagus, passed easily when given a bolus of puree, pill  passes  through GE junction without difficulty.         Harlon Ditty, [place] CCC-SLP [phone]  Dyanne Iha Riley Nearing 01/16/2013, 3:11 PM    Medications: I have reviewed the patient's current medications. Scheduled Meds: . albuterol  2.5 mg Nebulization BID  . docusate sodium  100 mg Oral BID  . enoxaparin (LOVENOX) injection  40 mg Subcutaneous Q24H  . hydrocerin   Topical BID  . ipratropium  0.5 mg Nebulization BID  . metoprolol succinate  12.5 mg Oral Daily  . pantoprazole  40 mg Oral BID  . senna  1 tablet Oral BID  . sodium chloride  3 mL Intravenous Q12H   Continuous Infusions:none   PRN Meds:.acetaminophen, acetaminophen, alum & mag hydroxide-simeth, bisacodyl, HYDROcodone-acetaminophen, menthol-cetylpyridinium, metoCLOPramide (REGLAN) injection, metoCLOPramide, morphine injection, ondansetron (ZOFRAN) IV, ondansetron, phenol, polyethylene glycol, sodium chloride Assessment/Plan: Fracture of femoral neck, left  -POD#2 Left cannulated hip pinning by Dr. Dion Saucier.  -Weight bearing as tolerated.  -Dry dressings PRN.  -Continue Dys2 (thin liquid diet) SLP following will advance diet as tolerated. -Will Discharge today to SNF with PT/OT NSTEMI Type II  -Cardiology on board, likely due to demand ischemia.  -Troponins trended down. No current chest pain.  Symptomatic microcytic anemia  -Patient denies any dizziness currently.  Hgb has dropped slightly to 8.8 today.  MCV of 69.8  With patient's history this is consistent with Iron deficiency anemia.  Will prescribe supplemental Iron and recommend follow up with GI as outpatient for EGD. CAD (coronary artery disease)  -Stable, (patient has refused ASA and other medications), would continue to hold aspirin due to possible slow GI bleed. Cirrhosis of liver  -Stable Asthma/COPD -Mild expiratory wheezing on exam.  -Albuterol nebs Q4 Atrovent nebs Q4  -O2 by nasal cannula to maintain O2 sat >92%.  -Will resume home medications  on D/C to  SNF Renal insufficeny -On admission patient's creatine was increased to 1.1 from roughly 0.8 in April 2013.  Patient has recently increased her dose of lasix, lasix was held and patient has received gentile IVF.   Patient's husband also reports patient was recently taking more naproxen than usual.  -Creatinine has remained stable at 1.07 today.  Will recommend follow up and coorelation to more recent labs by PCP. Hypertension  -Stable - Continue to monitor  Dysphasia  -SLP evaluated, MBS did not show aspiration, did show mild dysphasia, cont  dys2 diet and supervision and aspiration precautions. Severe  Protein- Calorie Malnutrition -Albumin 2.5 on 7/7.  Nutrition consult recommends Ensure complete po BID.  Will continue and recommend on discharge. Left lower leg rash  -Not warm, not tender to palpation, does not appear to be cellulitis. Appears to be improving. -Eucerin cream BID.  DVT prophylaxis: SCDs  Code Status: DNR/DNI  Dispo: Will discharge to SNF today.  The patient does have a current PCP [name], MD) and does not need an [facility] hospital follow-up appointment after discharge.  The patient does not have transportation limitations that hinder transportation to clinic appointments.   .Services Needed at time of discharge: Y = Yes, Blank = No PT:   OT:   RN:   Equipment:   Other:     LOS: 4 days   Carlynn Purl, DO 01/17/2013, 2:21 PM

## 2013-01-17 NOTE — Progress Notes (Signed)
Subjective:  Patient is doing well after hip surgery . Denies chest pain or dyspnea.  Objective:  Vital Signs in the last 24 hours: Temp:  [97.8 F (36.6 C)-98.5 F (36.9 C)] 97.8 F (36.6 C) (07/09 0550) Pulse Rate:  [71-80] 80 (07/09 0550) Resp:  [16] 16 (07/09 0550) BP: (109-130)/(41-87) 130/65 mmHg (07/09 0550) SpO2:  [91 %-100 %] 92 % (07/09 0814) Weight:  [119 lb 1.6 oz (54.023 kg)] 119 lb 1.6 oz (54.023 kg) (07/09 0500)  Intake/Output from previous day: 07/08 0701 - 07/09 0700 In: 243 [P.O.:240; I.V.:3] Out: 750 [Urine:750] Intake/Output from this shift:    . albuterol  2.5 mg Nebulization BID  . docusate sodium  100 mg Oral BID  . enoxaparin (LOVENOX) injection  40 mg Subcutaneous Q24H  . hydrocerin   Topical BID  . ipratropium  0.5 mg Nebulization BID  . metoprolol succinate  12.5 mg Oral Daily  . pantoprazole (PROTONIX) IV  40 mg Intravenous Q12H  . senna  1 tablet Oral BID  . sodium chloride  3 mL Intravenous Q12H   . 0.45 % NaCl with KCl 20 mEq / L 75 mL/hr at 01/15/13 2137    Physical Exam: The patient appears to be in no distress.  Head and neck exam reveals that the pupils are equal and reactive.  The extraocular movements are full.  There is no scleral icterus.  Mouth and pharynx are benign.  No lymphadenopathy.  No carotid bruits.  The jugular venous pressure is normal.  Thyroid is not enlarged or tender.  Chest mild bibasilar rhonchi.  Heart reveals no abnormal lift or heave.  First and second heart sounds are normal.  There is no murmur gallop rub or click.  The abdomen is soft and nontender.  Bowel sounds are normoactive.  There is no hepatosplenomegaly or mass.  There are no abdominal bruits.  Extremities reveal no phlebitis or edema.  Pedal pulses are good.  There is no cyanosis or clubbing.   Neurologic exam is normal strength and no lateralizing weakness.  No sensory deficits.  Integument reveals no rash  Lab Results:  Recent Labs  01/16/13 0622 01/17/13 0450  WBC 8.0 6.9  HGB 9.2* 8.8*  PLT 170 171    Recent Labs  01/16/13 0622 01/17/13 0450  NA 136 135  K 4.0 3.5  CL 98 100  CO2 26 29  GLUCOSE 102* 94  BUN 35* 32*  CREATININE 1.19* 1.07    Recent Labs  01/14/13 1235 01/15/13 0009  TROPONINI 1.16* 0.93*   Hepatic Function Panel  Recent Labs  01/15/13 0010  PROT 6.0  ALBUMIN 2.5*  AST 29  ALT 10  ALKPHOS 59  BILITOT 0.8   No results found for this basename: CHOL,  in the last 72 hours No results found for this basename: PROTIME,  in the last 72 hours  Imaging: Dg Hip Operative Left  01/15/2013   *RADIOLOGY REPORT*  Clinical Data: Femoral fixation.  OPERATIVE LEFT HIP  Comparison: 01/13/2013  Findings: Two intraoperative views which demonstrate screw fixation of the proximal left femur. No acute hardware complication. Subcapital femoral fracture is less well visualized today.  IMPRESSION: Intraoperative imaging of fixation of proximal left femur.   Original Report Authenticated By: Jeronimo Greaves, M.D.   Dg Pelvis Portable  01/15/2013   *RADIOLOGY REPORT*  Clinical Data: Postop for left hip pinning.  PORTABLE PELVIS  Comparison: 01/13/2013 and intraoperative imaging of same date.  Findings: Interval screw fixation of  the previously described subcapital left femoral fracture. No acute hardware complication. Right hip arthroplasty incidentally noted.  IMPRESSION: Expected appearance after proximal left femoral fixation.   Original Report Authenticated By: Jeronimo Greaves, M.D.   Dg Hip Portable 1 View Left  01/15/2013   *RADIOLOGY REPORT*  Clinical Data: Postop of left hip fixation.  PORTABLE LEFT HIP - 1 VIEW  Comparison: Intraoperative imaging of earlier today and preoperative imaging of 01/13/2013  Findings: Single lateral view of the left hip which demonstrates screw fixation of the proximal left femoral fracture.  The femoral head and proximal portion of the neck are not included on this lateral view.   IMPRESSION: Limited lateral imaging demonstrating screw fixation of the previously described femoral neck fracture.   Original Report Authenticated By: Jeronimo Greaves, M.D.   Dg Swallowing Func-speech Pathology  01/16/2013   [name], CCC-SLP     01/16/2013  3:12 PM Objective Swallowing Evaluation: Modified Barium Swallowing Study   Patient Details  Name: [name] MRN: [id number] Date of Birth: 07/29/1928  Today's Date: 01/16/2013 Time: 6962-9528 SLP Time Calculation (min): 25 min  Past Medical History:  Past Medical History  Diagnosis Date  . Coronary artery disease     s/p PCI in 1999 with peri-cath CVA, previous stress test  which  showed ischemia in the inferior and inferolateral wall--> refused  cardiac catheterization and has been treated medically though  does not want to take statin or ASA  . CVA (cerebral vascular accident)   . GERD (gastroesophageal reflux disease)   . Hypertension   . Femoral neck fracture 12/2008    right  . Asthma   . Hyperlipidemia   . Breast cancer   . Cryptogenic cirrhosis     Dr. Arlyce Dice  . GI bleed Mar 2012    transfused 4 units PRBCs EGD showed bleed at duodenal bulb.   Plavix was D/C at this time.    Past Surgical History:  Past Surgical History  Procedure Laterality Date  . Hemiarthroplasty hip    . Cardiac catheterization    . Bilateral mastectomy    . Abdominal hysterectomy    . Laparotomy  10/29/2011    Procedure: EXPLORATORY LAPAROTOMY;  Surgeon: Robyne Askew,  MD;  Location: [facility] OR;  Service: General;  Laterality: N/A;   EXPLORATORY LAPAROTOMY, LYSIS OF ADHESIONS, PERITONEAL WASHINGS.   HPI:  [name] is an 79yo woman with PMH of CAD s/p stent in 1999,  peri cath CVA, asthma, HTN, HLD, cirrhosis, Rt hip replacement,  CKD, h/o GI bleeding who presents after a fall. She fell and fx  her left hip, underwent repair on 7/7. Apparently for the last  3-6 days pta [name] had been more somnolent and groggy, but  able to move around with her walker and interact.   Of note, over  the last 3 days pta, [name] had been complaining of more pain  and taking aleve twice a day. She takes reflux medication, and pt  and husband report history of trouble swallowing and about 3  episodes a week of emesis/coughing up food. CXR on admission  showed RLL pna which could represent aspiration pneumonitis.  Records show an MBS in 2002 with no aspiration, but documented  distal esophageal narrowing and a history of DISTAL ESOPHAGEAL  STRICTURE.  HISTORY OF FUNDOPLICATION FOR HIATAL HERNIA IN 1997.     Assessment / Plan / Recommendation Clinical Impression  Dysphagia Diagnosis: Mild oral phase dysphagia;Mild pharyngeal  phase dysphagia;Mild cervical esophageal phase dysphagia Clinical impression: Pt demonstrates a mild oral dysphagia  related to loose dentition. She demonstrates ability to [facility]  very soft solids without significant difficulty. There is a mild  delay in swallow initiation that results in trace flash  penetration. No aspiraiton of thin liquids observed during test.  After fluoro turned off, pt given water resulting in throat  clear. Suspect frank penetration in this case, but with  successful sensation and expulsion. Esophageal function appeared  [facility] though a bite of puree is required to transit pills.   Suspect that when pt is lethargic, has poor positioning or is  particularly weak, episodes of aspiration could occur. Recommend  pt initiate a dys 2/thin liquid diet with full supervision and  aspiration precautions.  SLP will follow for tolerance and  precautions.      Treatment Recommendation  Therapy as outlined in treatment plan below    Diet Recommendation Dysphagia 2 (Fine chop);Thin liquid   Liquid Administration via: Cup;Straw Medication Administration: Whole meds with puree Supervision: Full supervision/cueing for compensatory  strategies;Staff feed patient Compensations: Slow rate;Small sips/bites;Follow solids with  liquid Postural Changes and/or Swallow  Maneuvers: Seated upright 90  degrees;Upright 30-60 min after meal;Out of bed for meals    Other  Recommendations Recommended Consults: MBS Oral Care Recommendations: Oral care BID   Follow Up Recommendations  Skilled Nursing facility    Frequency and Duration min 2x/week  2 weeks   Pertinent Vitals/Pain NA    SLP Swallow Goals     General HPI: Ms. Nader is an 79yo woman with PMH of CAD s/p  stent in 1999, peri cath CVA, asthma, HTN, HLD, cirrhosis, Rt hip  replacement, CKD, h/o GI bleeding who presents after a fall. She  fell and fx her left hip, underwent repair on 7/7. Apparently for  the last 3-6 days pta Ms. Bale had been more somnolent and  groggy, but able to move around with her walker and interact.  Of  note, over the last 3 days pta, Ms. Merlin had been complaining  of more pain and taking aleve twice a day. She takes reflux  medication, and pt and husband report history of trouble  swallowing and about 3 episodes a week of emesis/coughing up  food. CXR on admission showed RLL pna which could represent  aspiration pneumonitis. Records show an MBS in 2002 with no  aspiration, but documented distal esophageal narrowing and a  history of DISTAL ESOPHAGEAL STRICTURE.  HISTORY OF  FUNDOPLICATION FOR HIATAL HERNIA IN 1997. Type of Study: Modified Barium Swallowing Study Reason for Referral: Objectively evaluate swallowing function Previous Swallow Assessment: MBS 2002 regular thin Diet Prior to this Study: Dysphagia 1 (puree) Temperature Spikes Noted: No Respiratory Status: Supplemental O2 delivered via (comment) History of Recent Intubation: Yes Length of Intubations (days): 1 days Date extubated: 01/15/13 Behavior/Cognition: Alert;Confused;Cooperative Oral Cavity - Dentition: Dentures, top;Dentures, bottom Oral Motor / Sensory Function: Within functional limits Self-Feeding Abilities: Needs assist Patient Positioning: Upright in chair Baseline Vocal Quality: Clear Volitional Cough: Strong Volitional  Swallow: Able to elicit Anatomy: Within functional limits Pharyngeal Secretions: Not observed secondary MBS    Reason for Referral Objectively evaluate swallowing function   Oral Phase Oral Preparation/Oral Phase Oral Phase: Impaired Oral - Thin Oral - Thin Cup: Holding of bolus Oral - Thin Straw: Holding of bolus Oral - Solids Oral - Puree: Holding of bolus Oral - Regular: Impaired mastication (loose dentures) Oral - Pill: Within functional limits  Pharyngeal Phase Pharyngeal Phase Pharyngeal Phase: Impaired Pharyngeal - Thin Pharyngeal - Thin Cup: Delayed swallow  initiation;Penetration/Aspiration before swallow Penetration/Aspiration details (thin cup): Material enters  airway, remains ABOVE vocal cords then ejected out Pharyngeal - Thin Straw: Delayed swallow  initiation;Penetration/Aspiration before swallow Penetration/Aspiration details (thin straw): Material enters  airway, remains ABOVE vocal cords then ejected out Pharyngeal - Solids Pharyngeal - Puree: Within functional limits Pharyngeal - Mechanical Soft: Within functional limits Pharyngeal - Pill: Within functional limits  Cervical Esophageal Phase    GO    Cervical Esophageal Phase Cervical Esophageal Phase: Impaired Cervical Esophageal Phase - Comment Cervical Esophageal Comment: Pill briefly lodged in proximal  esophagus, passed easily when given a bolus of puree, pill passes  through GE junction without difficulty.         Harlon Ditty, [place] CCC-SLP [phone]  Dyanne Iha Maria Nearing 01/16/2013, 3:11 PM     Cardiac Studies: Telemetry shows NSR. Assessment/Plan:  1. Known ischemic heart disease status post PCI in 1999 with precatheterization stroke. Known reversible ischemia by previous stress tests and refused further evaluation. No recent chest pain but does have elevated troponin consistent with type II non-STEMI secondary to demand ischemia from profound anemia and stress of hip fracture. Troponins trending down. 2. severe anemia with past  history of bleeding duodenal ulcer and requiring 4 units of packed cell transfusion one year ago. Chronic history of melena and has been on high-dose NSAIDs in the form of naproxen. Hgb 8.8 this am. 3. fractured left hip secondary to mechanical fall.  Plan: Continue low-dose beta blocker for known ischemic heart disease with recent non-STEMI.  Not a candidate for long-term aspirin because of chronic GI bleed with anemia.  Has refused statins in the past. Hemoglobin drifting down and would consider adding oral iron therapy.  She will need close followup with her medical doctor after discharge for following her hemoglobin.  No further cardiac tests planned at this time.  Cardiology followup will be in [place] with Dr.Gollan.  Okay for discharge from cardiology standpoint.  LOS: 4 days    Cassell Clement 01/17/2013, 8:50 AM

## 2013-01-17 NOTE — Progress Notes (Signed)
Patient ID: [name], female   DOB: [birth year], 79 y.o.   MRN: [id number]     Subjective:  Patient reports pain as mild to moderate.  Patient is alert and follows commands and states that she feels better  Objective:   VITALS:   Filed Vitals:   01/16/13 2033 01/16/13 2140 01/17/13 0500 01/17/13 0550  BP:  123/87  130/65  Pulse:  71  80  Temp:  98.5 F (36.9 C)  97.8 F (36.6 C)  TempSrc:      Resp:  16  16  Height:      Weight:   54.023 kg (119 lb 1.6 oz)   SpO2: 91% 100%  94%    ABD soft Sensation intact distally Dorsiflexion/Plantar flexion intact Incision: dressing C/D/I and no drainage Dry dressing applied and wound is clean and dry with no sign of infection   Lab Results  Component Value Date   WBC 6.9 01/17/2013   HGB 8.8* 01/17/2013   HCT 27.0* 01/17/2013   MCV 69.8* 01/17/2013   PLT 171 01/17/2013     Assessment/Plan: 2 Days Post-Op   Principal Problem:   Fracture of femoral neck, left Active Problems:   CAD (coronary artery disease)   Cirrhosis of liver   Microcytic anemia   Unspecified protein-calorie malnutrition   NSTEMI (non-ST elevated myocardial infarction)   Renal insufficiency   Protein-calorie malnutrition, severe   Advance diet Up with therapy WBAT  Dry dressing PRN Continue plan per medicine Okay for DC from ortho standpoint    Haskel Khan 01/17/2013, 8:11 AM   Teryl Lucy, MD Cell [phone]

## 2013-01-17 NOTE — Progress Notes (Signed)
Clinical social worker assisted with patient discharge to skilled nursing facility, [facility],  CSW addressed all family questions and concerns. CSW copied chart and added all important documents. CSW also set up patient transportation with [employer]. Clinical Social Worker will sign off for now as social work intervention is no longer needed.   Sabino Niemann, MSW, [phone]

## 2013-01-18 NOTE — Discharge Summary (Signed)
  Date: 01/18/2013  Patient name: [name]  Medical record number: [id number]  Date of birth: 08/18/1928   This patient has been seen and the plan of care was discussed with the house staff. Please see their note for complete details. I concur with their findings and plan.   [name], DO 01/18/2013, 4:07 PM

## 2013-03-25 IMAGING — CR DG CHEST 1V PORT
1 series · 1 of 1 positions shown · non-contrast
Comparison: 10/28/2011

PORTABLE CHEST - 1 VIEW

HISTORY: Shortness of breath

[AP]
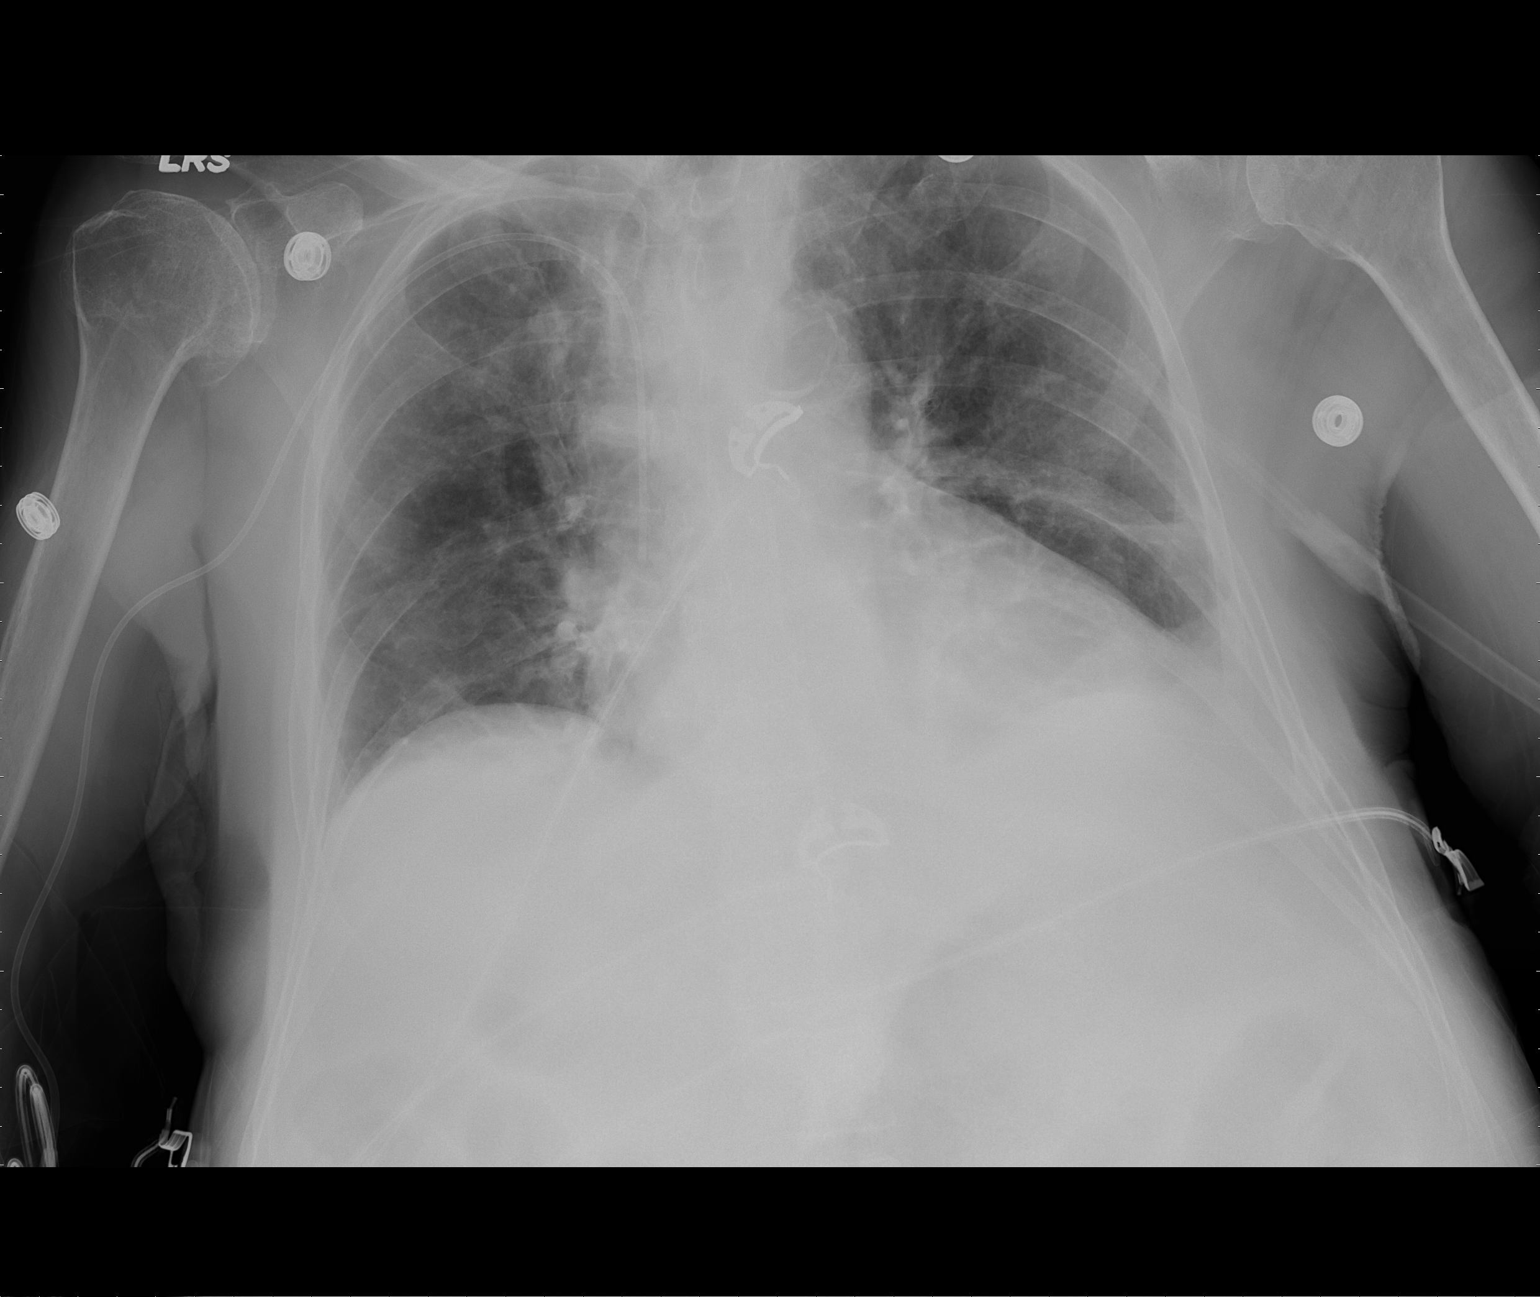

[1 of 1 positions shown; findings below may reference images not displayed]

FINDINGS: Right-sided PICC line tip terminates over the cavoatrial
junction.  Mild cardiomegaly persists.  Lung volumes are extremely
low, with curvilinear bilateral lower lobe atelectasis.  Trace left
pleural effusion or atelectasis.  Right glenohumeral joint
degenerative change.  Mild diffuse gaseous prominence of loops of
bowel project in the upper abdomen.
IMPRESSION: Extremely low lung volumes with bilateral lower lobe atelectasis
and possible trace left pleural effusion. If the patient's symptoms
continue, consider PA and lateral chest radiographs obtained at
full inspiration when the patient is clinically able.

## 2013-03-27 IMAGING — CR DG CHEST 2V
2 series · 2 of 2 positions shown · non-contrast
Comparison: 10/31/2011

CLINICAL DATA: Wheezing.  Left lower chest pain.

CHEST - 2 VIEW

[w chest lat]
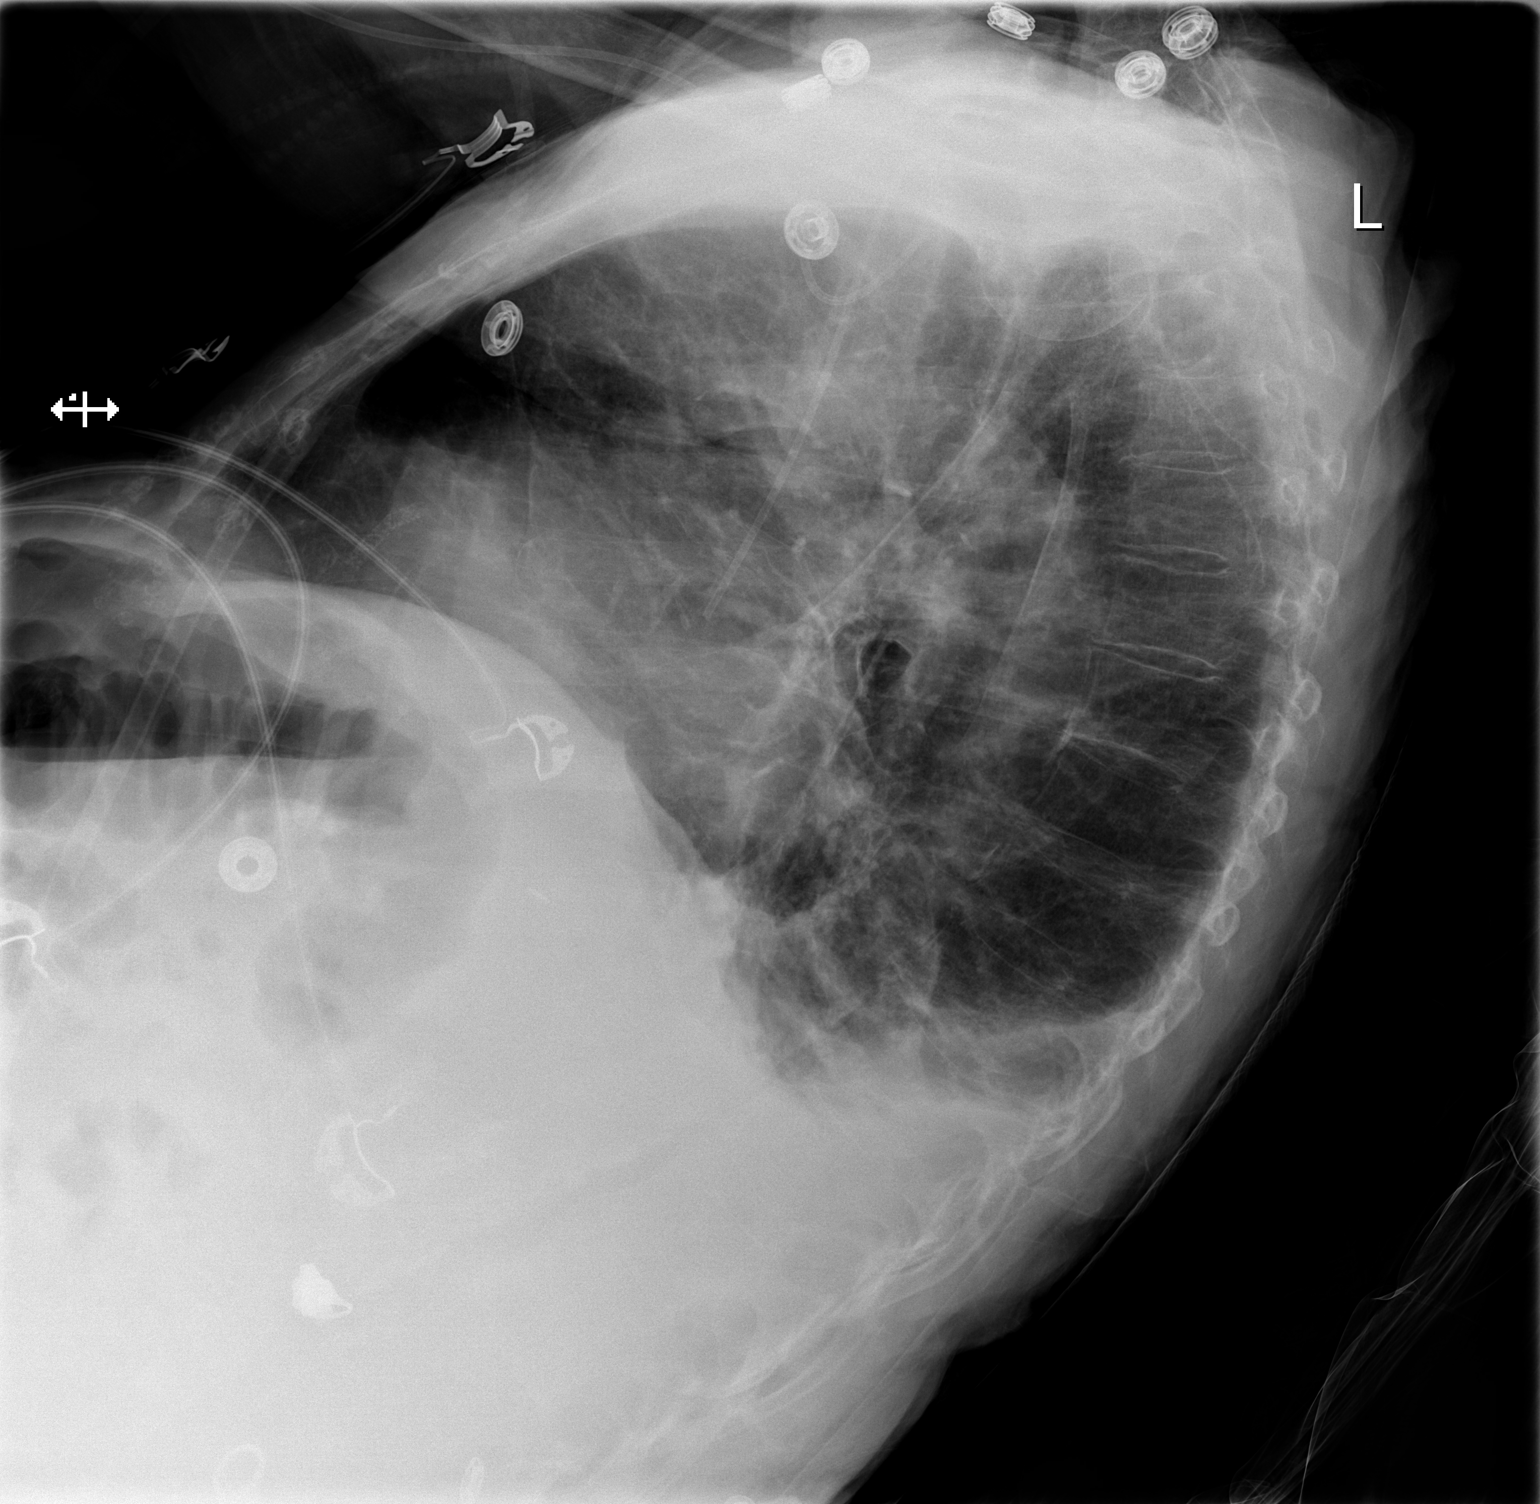

[x chest ap]
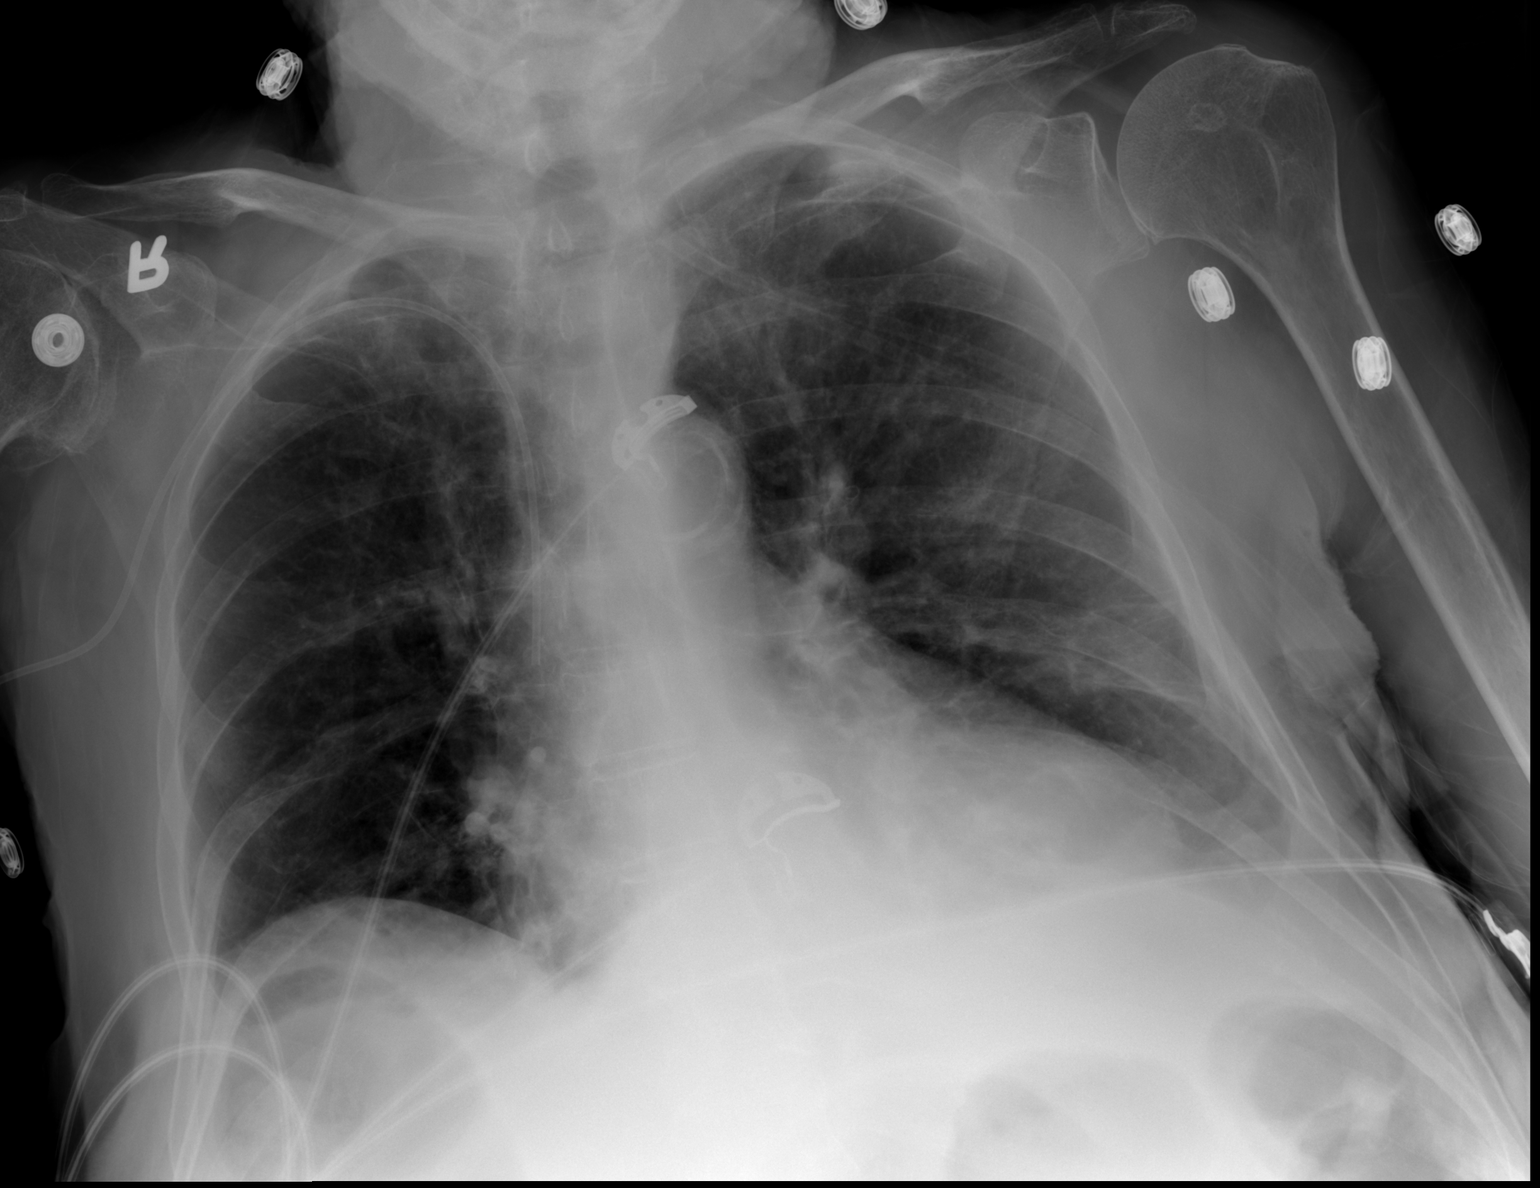

[2 of 2 positions shown; findings below may reference images not displayed]

FINDINGS: There is a right arm PICC line with tip in the cavoatrial
junction.

Small bilateral pleural effusions are again noted and appear
unchanged from previous exam.

Atelectasis is noted within the left midlung and left base.

No new findings identified.
IMPRESSION: 1.  Low lung volumes and left base atelectasis.
2.  Stable bilateral pleural effusions.

## 2013-09-19 ENCOUNTER — Ambulatory Visit
Admission: RE | Admit: 2013-09-19 | Discharge: 2013-09-19 | Disposition: A | Discharge status: Home / Self Care | Payer: Medicare Other | Source: Ambulatory Visit | Attending: Family Medicine | Admitting: Family Medicine

## 2013-09-19 ENCOUNTER — Other Ambulatory Visit: Payer: Self-pay | Admitting: Family Medicine

## 2013-09-19 DIAGNOSIS — R634 Abnormal weight loss: Secondary | ICD-10-CM

## 2013-09-19 DIAGNOSIS — R52 Pain, unspecified: Secondary | ICD-10-CM

## 2014-10-18 ENCOUNTER — Emergency Department (HOSPITAL_COMMUNITY): Payer: Medicare Other

## 2014-10-18 ENCOUNTER — Other Ambulatory Visit (HOSPITAL_COMMUNITY): Payer: Self-pay

## 2014-10-18 ENCOUNTER — Inpatient Hospital Stay (HOSPITAL_COMMUNITY): Payer: Medicare Other

## 2014-10-18 ENCOUNTER — Encounter (HOSPITAL_COMMUNITY): Payer: Self-pay | Admitting: Emergency Medicine

## 2014-10-18 ENCOUNTER — Inpatient Hospital Stay (HOSPITAL_COMMUNITY)
Admission: EM | Admit: 2014-10-18 | Discharge: 2014-10-21 | DRG: 432 | Disposition: A | Discharge status: Home / Self Care | Payer: Medicare Other | Attending: Internal Medicine | Admitting: Internal Medicine

## 2014-10-18 DIAGNOSIS — N390 Urinary tract infection, site not specified: Secondary | ICD-10-CM

## 2014-10-18 DIAGNOSIS — J9811 Atelectasis: Secondary | ICD-10-CM | POA: Diagnosis present

## 2014-10-18 DIAGNOSIS — K766 Portal hypertension: Secondary | ICD-10-CM | POA: Diagnosis present

## 2014-10-18 DIAGNOSIS — I2510 Atherosclerotic heart disease of native coronary artery without angina pectoris: Secondary | ICD-10-CM | POA: Diagnosis present

## 2014-10-18 DIAGNOSIS — K219 Gastro-esophageal reflux disease without esophagitis: Secondary | ICD-10-CM | POA: Diagnosis present

## 2014-10-18 DIAGNOSIS — E785 Hyperlipidemia, unspecified: Secondary | ICD-10-CM | POA: Diagnosis present

## 2014-10-18 DIAGNOSIS — Z9981 Dependence on supplemental oxygen: Secondary | ICD-10-CM | POA: Diagnosis not present

## 2014-10-18 DIAGNOSIS — E46 Unspecified protein-calorie malnutrition: Secondary | ICD-10-CM | POA: Diagnosis present

## 2014-10-18 DIAGNOSIS — I4891 Unspecified atrial fibrillation: Secondary | ICD-10-CM | POA: Diagnosis present

## 2014-10-18 DIAGNOSIS — Z7901 Long term (current) use of anticoagulants: Secondary | ICD-10-CM | POA: Diagnosis not present

## 2014-10-18 DIAGNOSIS — Z9013 Acquired absence of bilateral breasts and nipples: Secondary | ICD-10-CM | POA: Diagnosis present

## 2014-10-18 DIAGNOSIS — J90 Pleural effusion, not elsewhere classified: Secondary | ICD-10-CM

## 2014-10-18 DIAGNOSIS — Z9889 Other specified postprocedural states: Secondary | ICD-10-CM

## 2014-10-18 DIAGNOSIS — Z66 Do not resuscitate: Secondary | ICD-10-CM | POA: Diagnosis present

## 2014-10-18 DIAGNOSIS — Z8673 Personal history of transient ischemic attack (TIA), and cerebral infarction without residual deficits: Secondary | ICD-10-CM

## 2014-10-18 DIAGNOSIS — R0600 Dyspnea, unspecified: Secondary | ICD-10-CM

## 2014-10-18 DIAGNOSIS — J918 Pleural effusion in other conditions classified elsewhere: Secondary | ICD-10-CM | POA: Diagnosis present

## 2014-10-18 DIAGNOSIS — J45909 Unspecified asthma, uncomplicated: Secondary | ICD-10-CM | POA: Diagnosis present

## 2014-10-18 DIAGNOSIS — E43 Unspecified severe protein-calorie malnutrition: Secondary | ICD-10-CM | POA: Diagnosis present

## 2014-10-18 DIAGNOSIS — D649 Anemia, unspecified: Secondary | ICD-10-CM | POA: Diagnosis present

## 2014-10-18 DIAGNOSIS — K7469 Other cirrhosis of liver: Secondary | ICD-10-CM | POA: Diagnosis present

## 2014-10-18 DIAGNOSIS — Z681 Body mass index (BMI) 19 or less, adult: Secondary | ICD-10-CM

## 2014-10-18 DIAGNOSIS — Z9861 Coronary angioplasty status: Secondary | ICD-10-CM | POA: Diagnosis not present

## 2014-10-18 DIAGNOSIS — K7460 Unspecified cirrhosis of liver: Secondary | ICD-10-CM | POA: Diagnosis present

## 2014-10-18 DIAGNOSIS — I10 Essential (primary) hypertension: Secondary | ICD-10-CM | POA: Diagnosis present

## 2014-10-18 DIAGNOSIS — R0602 Shortness of breath: Secondary | ICD-10-CM | POA: Diagnosis present

## 2014-10-18 DIAGNOSIS — Z853 Personal history of malignant neoplasm of breast: Secondary | ICD-10-CM

## 2014-10-18 DIAGNOSIS — J948 Other specified pleural conditions: Secondary | ICD-10-CM

## 2014-10-18 DIAGNOSIS — I25119 Atherosclerotic heart disease of native coronary artery with unspecified angina pectoris: Secondary | ICD-10-CM

## 2014-10-18 HISTORY — DX: Pleural effusion, not elsewhere classified: J90

## 2014-10-18 HISTORY — DX: Unspecified intestinal obstruction, unspecified as to partial versus complete obstruction: K56.609

## 2014-10-18 HISTORY — DX: Edema, unspecified: R60.9

## 2014-10-18 HISTORY — DX: Disorientation, unspecified: R41.0

## 2014-10-18 HISTORY — DX: Localized edema: R60.0

## 2014-10-18 HISTORY — DX: Pneumonia, unspecified organism: J18.9

## 2014-10-18 HISTORY — DX: Dependence on supplemental oxygen: Z99.81

## 2014-10-18 HISTORY — DX: Unspecified protein-calorie malnutrition: E46

## 2014-10-18 LAB — COMPREHENSIVE METABOLIC PANEL
ALBUMIN: 2.6 g/dL — AB (ref 3.5–5.2)
ALK PHOS: 55 U/L (ref 39–117)
ALT: 10 U/L (ref 0–35)
ANION GAP: 4 — AB (ref 5–15)
AST: 18 U/L (ref 0–37)
BUN: 16 mg/dL (ref 6–23)
CO2: 34 mmol/L — ABNORMAL HIGH (ref 19–32)
Calcium: 8.7 mg/dL (ref 8.4–10.5)
Chloride: 98 mmol/L (ref 96–112)
Creatinine, Ser: 0.7 mg/dL (ref 0.50–1.10)
GFR calc non Af Amer: 78 mL/min — ABNORMAL LOW (ref >90)
GLUCOSE: 103 mg/dL — AB (ref 70–99)
POTASSIUM: 4.5 mmol/L (ref 3.5–5.1)
SODIUM: 136 mmol/L (ref 135–145)
TOTAL PROTEIN: 6.4 g/dL (ref 6.0–8.3)
Total Bilirubin: 0.9 mg/dL (ref 0.3–1.2)

## 2014-10-18 LAB — URINALYSIS, ROUTINE W REFLEX MICROSCOPIC
Glucose, UA: NEGATIVE mg/dL
Ketones, ur: NEGATIVE mg/dL
Nitrite: NEGATIVE
PH: 6 (ref 5.0–8.0)
Protein, ur: NEGATIVE mg/dL
Specific Gravity, Urine: 1.022 (ref 1.005–1.030)
Urobilinogen, UA: 0.2 mg/dL (ref 0.0–1.0)

## 2014-10-18 LAB — AMMONIA: Ammonia: 36 umol/L — ABNORMAL HIGH (ref 11–32)

## 2014-10-18 LAB — CBC WITH DIFFERENTIAL/PLATELET
BASOS PCT: 1 % (ref 0–1)
Basophils Absolute: 0.1 10*3/uL (ref 0.0–0.1)
Eosinophils Absolute: 0.3 10*3/uL (ref 0.0–0.7)
Eosinophils Relative: 5 % (ref 0–5)
HCT: 34.4 % — ABNORMAL LOW (ref 36.0–46.0)
Hemoglobin: 10.8 g/dL — ABNORMAL LOW (ref 12.0–15.0)
LYMPHS ABS: 0.8 10*3/uL (ref 0.7–4.0)
Lymphocytes Relative: 12 % (ref 12–46)
MCH: 25.3 pg — AB (ref 26.0–34.0)
MCHC: 31.4 g/dL (ref 30.0–36.0)
MCV: 80.6 fL (ref 78.0–100.0)
Monocytes Absolute: 0.6 10*3/uL (ref 0.1–1.0)
Monocytes Relative: 8 % (ref 3–12)
NEUTROS ABS: 5.3 10*3/uL (ref 1.7–7.7)
Neutrophils Relative %: 74 % (ref 43–77)
PLATELETS: 121 10*3/uL — AB (ref 150–400)
RBC: 4.27 MIL/uL (ref 3.87–5.11)
RDW: 16 % — ABNORMAL HIGH (ref 11.5–15.5)
WBC: 7.2 10*3/uL (ref 4.0–10.5)

## 2014-10-18 LAB — URINE MICROSCOPIC-ADD ON

## 2014-10-18 LAB — PROTIME-INR
INR: 1.3 (ref 0.00–1.49)
PROTHROMBIN TIME: 16.3 s — AB (ref 11.6–15.2)

## 2014-10-18 LAB — TROPONIN I

## 2014-10-18 LAB — BRAIN NATRIURETIC PEPTIDE
B Natriuretic Peptide: 191.6 pg/mL — ABNORMAL HIGH (ref 0.0–100.0)
B Natriuretic Peptide: 217.1 pg/mL — ABNORMAL HIGH (ref 0.0–100.0)

## 2014-10-18 LAB — LACTIC ACID, PLASMA
Lactic Acid, Venous: 1.3 mmol/L (ref 0.5–2.0)
Lactic Acid, Venous: 1.1 mmol/L (ref 0.5–2.0)

## 2014-10-18 LAB — TSH: TSH: 3.533 u[IU]/mL (ref 0.350–4.500)

## 2014-10-18 LAB — PROCALCITONIN

## 2014-10-18 MED ORDER — PANTOPRAZOLE SODIUM 40 MG PO TBEC
40.0000 mg | DELAYED_RELEASE_TABLET | Freq: Every day | ORAL | Status: DC
  Administered 2014-10-18 – 2014-10-21 (×4): 40 mg via ORAL
  Filled 2014-10-18 (×4): qty 1

## 2014-10-18 MED ORDER — FUROSEMIDE 10 MG/ML IJ SOLN
40.0000 mg | Freq: Once | INTRAMUSCULAR | Status: AC
  Administered 2014-10-18: 40 mg via INTRAVENOUS
  Filled 2014-10-18: qty 4

## 2014-10-18 MED ORDER — ALBUTEROL SULFATE (2.5 MG/3ML) 0.083% IN NEBU: 2.5000 mg | INHALATION_SOLUTION | RESPIRATORY_TRACT | Status: DC | PRN

## 2014-10-18 MED ORDER — DEXTROSE 5 % IV SOLN
500.0000 mg | Freq: Once | INTRAVENOUS | Status: AC
  Administered 2014-10-18: 500 mg via INTRAVENOUS
  Filled 2014-10-18: qty 500

## 2014-10-18 MED ORDER — BISACODYL 10 MG RE SUPP: 10.0000 mg | Freq: Every day | RECTAL | Status: DC | PRN

## 2014-10-18 MED ORDER — DOCUSATE SODIUM 100 MG PO CAPS
100.0000 mg | ORAL_CAPSULE | Freq: Two times a day (BID) | ORAL | Status: DC
  Administered 2014-10-18 – 2014-10-20 (×3): 100 mg via ORAL
  Filled 2014-10-18 (×8): qty 1

## 2014-10-18 MED ORDER — SODIUM CHLORIDE 0.9 % IV SOLN
INTRAVENOUS | Status: DC
  Administered 2014-10-18: 13:00:00 via INTRAVENOUS

## 2014-10-18 MED ORDER — HEPARIN SODIUM (PORCINE) 5000 UNIT/ML IJ SOLN
5000.0000 [IU] | Freq: Three times a day (TID) | INTRAMUSCULAR | Status: DC
  Administered 2014-10-18 – 2014-10-19 (×3): 5000 [IU] via SUBCUTANEOUS
  Filled 2014-10-18 (×4): qty 1

## 2014-10-18 MED ORDER — IPRATROPIUM-ALBUTEROL 0.5-2.5 (3) MG/3ML IN SOLN
3.0000 mL | Freq: Three times a day (TID) | RESPIRATORY_TRACT | Status: DC
  Administered 2014-10-19 – 2014-10-21 (×7): 3 mL via RESPIRATORY_TRACT
  Filled 2014-10-18 (×8): qty 3

## 2014-10-18 MED ORDER — ENSURE ENLIVE PO LIQD
237.0000 mL | Freq: Two times a day (BID) | ORAL | Status: DC
  Administered 2014-10-18 – 2014-10-19 (×2): 237 mL via ORAL

## 2014-10-18 MED ORDER — IPRATROPIUM-ALBUTEROL 0.5-2.5 (3) MG/3ML IN SOLN: 3.0000 mL | RESPIRATORY_TRACT | Status: DC | PRN

## 2014-10-18 MED ORDER — IOHEXOL 300 MG/ML  SOLN
80.0000 mL | Freq: Once | INTRAMUSCULAR | Status: AC | PRN
  Administered 2014-10-18: 80 mL via INTRAVENOUS

## 2014-10-18 MED ORDER — IPRATROPIUM-ALBUTEROL 0.5-2.5 (3) MG/3ML IN SOLN
3.0000 mL | Freq: Once | RESPIRATORY_TRACT | Status: AC
  Administered 2014-10-18: 3 mL via RESPIRATORY_TRACT
  Filled 2014-10-18: qty 3

## 2014-10-18 MED ORDER — DIAZEPAM 2 MG PO TABS
2.0000 mg | ORAL_TABLET | Freq: Two times a day (BID) | ORAL | Status: DC | PRN
Start: 2014-10-18 — End: 2014-10-21
  Administered 2014-10-18 – 2014-10-20 (×2): 2 mg via ORAL
  Filled 2014-10-18 (×2): qty 1

## 2014-10-18 MED ORDER — METOPROLOL SUCCINATE 12.5 MG HALF TABLET
12.5000 mg | ORAL_TABLET | Freq: Every day | ORAL | Status: DC
  Administered 2014-10-18 – 2014-10-21 (×4): 12.5 mg via ORAL
  Filled 2014-10-18 (×5): qty 1

## 2014-10-18 MED ORDER — FERROUS SULFATE 325 (65 FE) MG PO TABS
325.0000 mg | ORAL_TABLET | Freq: Three times a day (TID) | ORAL | Status: DC
  Administered 2014-10-18 – 2014-10-19 (×3): 325 mg via ORAL
  Filled 2014-10-18 (×5): qty 1

## 2014-10-18 MED ORDER — IPRATROPIUM-ALBUTEROL 0.5-2.5 (3) MG/3ML IN SOLN
3.0000 mL | Freq: Four times a day (QID) | RESPIRATORY_TRACT | Status: DC
  Administered 2014-10-18: 3 mL via RESPIRATORY_TRACT
  Filled 2014-10-18: qty 3

## 2014-10-18 MED ORDER — MONTELUKAST SODIUM 10 MG PO TABS
10.0000 mg | ORAL_TABLET | Freq: Every day | ORAL | Status: DC | PRN
  Filled 2014-10-18: qty 1

## 2014-10-18 MED ORDER — DEXTROSE 5 % IV SOLN
1.0000 g | Freq: Once | INTRAVENOUS | Status: AC
  Administered 2014-10-18: 1 g via INTRAVENOUS
  Filled 2014-10-18: qty 10

## 2014-10-18 MED ORDER — POLYETHYLENE GLYCOL 3350 17 G PO PACK
17.0000 g | PACK | Freq: Every day | ORAL | Status: DC | PRN
  Filled 2014-10-18: qty 1

## 2014-10-18 NOTE — ED Provider Notes (Signed)
CSN: [id number]     Arrival date & time 10/18/14  0759 History   First MD Initiated Contact with Patient 10/18/14 0805     Chief Complaint  Patient presents with  . Shortness of Breath      Patient is a 81 y.o. female presenting with shortness of breath. The history is provided by the EMS personnel and a caregiver.  Shortness of Breath Pt was seen at 0810. Per EMS and family report: c/o pt with progressively worsening "cough" and "chills" x1 week. EMS states pt usually wears 2L home O2 N/C, but they increased O2 to 3L en route. Pt has significant hx of dementia.    Past Medical History  Diagnosis Date  . Coronary artery disease     s/p PCI in 1999 with peri-cath CVA, previous stress test  which showed ischemia in the inferior and inferolateral wall--> refused cardiac catheterization and has been treated medically though does not want to take statin or ASA  . CVA (cerebral vascular accident)   . GERD (gastroesophageal reflux disease)   . Hypertension   . Femoral neck fracture 12/2008    right  . Asthma   . Hyperlipidemia   . Breast cancer   . Cryptogenic cirrhosis     Dr. Deatra Ina  . GI bleed Mar 2012    transfused 4 units PRBCs EGD showed bleed at duodenal bulb.  Plavix was D/C at this time.   . SBO (small bowel obstruction) 2013  . Peripheral edema   . On home O2   . Confusion   . Malnutrition    Past Surgical History  Procedure Laterality Date  . Hemiarthroplasty hip    . Cardiac catheterization    . Bilateral mastectomy    . Abdominal hysterectomy    . Laparotomy  10/29/2011    Procedure: EXPLORATORY LAPAROTOMY;  Surgeon: Merrie Roof, MD;  Location: [place];  Service: General;  Laterality: N/A;  EXPLORATORY LAPAROTOMY, LYSIS OF ADHESIONS, PERITONEAL WASHINGS.  . Hip pinning,cannulated Left 01/15/2013    Procedure: CANNULATED HIP PINNING;  Surgeon: Johnny Bridge, MD;  Location: [place];  Service: Orthopedics;  Laterality: Left;   Family History  Problem Relation Age of  Onset  . Heart attack Son     x 2   History  Substance Use Topics  . Smoking status: Never Smoker   . Smokeless tobacco: Never Used  . Alcohol Use: No    Review of Systems  Unable to perform ROS: Dementia  Respiratory: Positive for shortness of breath.       Allergies  Codeine  Home Medications   Prior to Admission medications   Medication Sig Start Date End Date Taking? Authorizing Provider  bisacodyl (DULCOLAX) 10 MG suppository Place 1 suppository (10 mg total) rectally daily as needed. 01/17/13   Neema Bobbie Stack, MD  diazepam (VALIUM) 5 MG tablet Take 5 mg by mouth as needed.  08/21/12   Historical Provider, MD  docusate sodium 100 MG CAPS Take 100 mg by mouth 2 (two) times daily. 01/17/13   Neema Bobbie Stack, MD  enoxaparin (LOVENOX) 40 MG/0.4ML injection Inject 0.4 mLs (40 mg total) into the skin daily. 01/15/13   Marchia Bond, MD  ferrous sulfate 325 (65 FE) MG tablet Take 1 tablet (325 mg total) by mouth 3 (three) times daily with meals. 01/17/13   Neema Bobbie Stack, MD  HYDROcodone-acetaminophen (NORCO) 5-325 MG per tablet Take 1-2 tablets by mouth every 6 (six) hours as needed for  pain. MAXIMUM TOTAL ACETAMINOPHEN DOSE IS 4000 MG PER DAY 01/15/13   Marchia Bond, MD  metoprolol succinate (TOPROL-XL) 12.5 mg TB24 Take 0.5 tablets (12.5 mg total) by mouth daily. 01/17/13   Neema Bobbie Stack, MD  montelukast (SINGULAIR) 10 MG tablet Take 10 mg by mouth as needed.  08/21/12   Historical Provider, MD  nitroGLYCERIN (NITROSTAT) 0.4 MG SL tablet Place 0.4 mg under the tongue every 5 (five) minutes as needed. Chest pain 09/21/11   Minna Merritts, MD  omeprazole (PRILOSEC) 20 MG capsule Take 20 mg by mouth daily.     Historical Provider, MD  polyethylene glycol (MIRALAX / GLYCOLAX) packet Take 17 g by mouth daily as needed. 01/17/13   Neema Bobbie Stack, MD  sennosides-docusate sodium (SENOKOT-S) 8.6-50 MG tablet Take 2 tablets by mouth daily. 01/15/13   Marchia Bond, MD  VENTOLIN HFA 108 (90 BASE) MCG/ACT  inhaler 2 puffs as needed.  10/07/12   Historical Provider, MD   BP 146/61 mmHg  Pulse 100  Temp(Src) 96.6 F (35.9 C) (Rectal)  SpO2 100% Physical Exam 0815: Physical examination:  Nursing notes reviewed; Vital signs and O2 SAT reviewed;  Constitutional: Thin, frail. In no acute distress; Head:  Normocephalic, atraumatic; Eyes: EOMI, PERRL, No scleral icterus; ENMT: Mouth and pharynx normal, Mucous membranes dry; Neck: Supple, Full range of motion, No lymphadenopathy; Cardiovascular: Irregular irregular rate and rhythm, No gallop; Respiratory: Breath sounds coarse & equal bilaterally, scattered exp wheezes. No audible wheezing. Normal respiratory effort/excursion; Chest: Nontender, Movement normal; Abdomen: Soft, Nontender, Nondistended, Normal bowel sounds; Genitourinary: No CVA tenderness; Extremities: Pulses normal, No tenderness, No edema, No calf edema or asymmetry.; Neuro: Awake, alert, confused re: events. Major CN grossly intact.  Speech minimal but clear. Moves all extremities spontaneously.; Skin: Color normal, Warm, Dry.   ED Course  Procedures     EKG Interpretation None      MDM  MDM Reviewed: previous chart, nursing note and vitals Reviewed previous: labs and ECG Interpretation: labs, ECG and x-ray   ED ECG REPORT   Date: 10/18/2014  Rate: 89  Rhythm: atrial fibrillation  QRS Axis: normal  Intervals: normal  ST/T Wave abnormalities: normal, artifact, baseline wander  Conduction Disutrbances:none  Narrative Interpretation:   Old EKG Reviewed: changes noted; NS STTW changes have improved from previous EKG dated 01/14/13.   Results for orders placed or performed during the hospital encounter of 10/18/14  Urinalysis, Routine w reflex microscopic  Result Value Ref Range   Color, Urine AMBER (A) YELLOW   APPearance CLEAR CLEAR   Specific Gravity, Urine 1.022 1.005 - 1.030   pH 6.0 5.0 - 8.0   Glucose, UA NEGATIVE NEGATIVE mg/dL   Hgb urine dipstick MODERATE (A)  NEGATIVE   Bilirubin Urine SMALL (A) NEGATIVE   Ketones, ur NEGATIVE NEGATIVE mg/dL   Protein, ur NEGATIVE NEGATIVE mg/dL   Urobilinogen, UA 0.2 0.0 - 1.0 mg/dL   Nitrite NEGATIVE NEGATIVE   Leukocytes, UA MODERATE (A) NEGATIVE  Comprehensive metabolic panel  Result Value Ref Range   Sodium 136 135 - 145 mmol/L   Potassium 4.5 3.5 - 5.1 mmol/L   Chloride 98 96 - 112 mmol/L   CO2 34 (H) 19 - 32 mmol/L   Glucose, Bld 103 (H) 70 - 99 mg/dL   BUN 16 6 - 23 mg/dL   Creatinine, Ser 0.70 0.50 - 1.10 mg/dL   Calcium 8.7 8.4 - 10.5 mg/dL   Total Protein 6.4 6.0 - 8.3 g/dL  Albumin 2.6 (L) 3.5 - 5.2 g/dL   AST 18 0 - 37 U/L   ALT 10 0 - 35 U/L   Alkaline Phosphatase 55 39 - 117 U/L   Total Bilirubin 0.9 0.3 - 1.2 mg/dL   GFR calc non Af Amer 78 (L) >90 mL/min   GFR calc Af Amer >90 >90 mL/min   Anion gap 4 (L) 5 - 15  Troponin I  Result Value Ref Range   Troponin I <0.03 <0.031 ng/mL  CBC with Differential  Result Value Ref Range   WBC 7.2 4.0 - 10.5 K/uL   RBC 4.27 3.87 - 5.11 MIL/uL   Hemoglobin 10.8 (L) 12.0 - 15.0 g/dL   HCT 34.4 (L) 36.0 - 46.0 %   MCV 80.6 78.0 - 100.0 fL   MCH 25.3 (L) 26.0 - 34.0 pg   MCHC 31.4 30.0 - 36.0 g/dL   RDW 16.0 (H) 11.5 - 15.5 %   Platelets 121 (L) 150 - 400 K/uL   Neutrophils Relative % 74 43 - 77 %   Neutro Abs 5.3 1.7 - 7.7 K/uL   Lymphocytes Relative 12 12 - 46 %   Lymphs Abs 0.8 0.7 - 4.0 K/uL   Monocytes Relative 8 3 - 12 %   Monocytes Absolute 0.6 0.1 - 1.0 K/uL   Eosinophils Relative 5 0 - 5 %   Eosinophils Absolute 0.3 0.0 - 0.7 K/uL   Basophils Relative 1 0 - 1 %   Basophils Absolute 0.1 0.0 - 0.1 K/uL  Protime-INR  Result Value Ref Range   Prothrombin Time 16.3 (H) 11.6 - 15.2 seconds   INR 1.30 0.00 - 1.49  Ammonia  Result Value Ref Range   Ammonia 36 (H) 11 - 32 umol/L  Brain natriuretic peptide  Result Value Ref Range   B Natriuretic Peptide 191.6 (H) 0.0 - 100.0 pg/mL  Urine microscopic-add on  Result Value Ref  Range   Squamous Epithelial / LPF FEW (A) RARE   WBC, UA 7-10 <3 WBC/hpf   RBC / HPF 7-10 <3 RBC/hpf   Bacteria, UA FEW (A) RARE   Dg Chest Port 1 View 10/18/2014   CLINICAL DATA:  Shortness of breath and congestion. See history of COPD.  EXAM: PORTABLE CHEST - 1 VIEW  COMPARISON:  PA and lateral chest 09/19/2013.  FINDINGS: The patient has a large right pleural effusion and extensive airspace disease. Very small left effusion is noted. The lungs are emphysematous. Heart size is normal.  IMPRESSION: Large right pleural effusion and extensive airspace disease could be due to to pneumonia cannot be definitively characterized. If the abnormality does not clear, short-term follow-up chest CT with contrast is recommended for further evaluation.   Electronically Signed   By: Inge Rise M.D.   On: 10/18/2014 08:55    1115:  Will tx for UTI while UC pending; IV rocephin ordered. Large right pleural effusion, but no definite pneumonia; will hold CAP abx at this time. Pt will need thoracentesis. T/C to [facility] Resident, case discussed, including:  HPI, pertinent PM/SHx, VS/PE, dx testing, ED course and treatment:  Agreeable to admit, requests to write temporary orders, obtain tele bed to Dr. Marinda Elk' service.    Francine Graven, DO 10/19/14 1550

## 2014-10-18 NOTE — Progress Notes (Signed)
  Echocardiogram 2D Echocardiogram has been performed.  [name] 10/18/2014, 3:07 PM

## 2014-10-18 NOTE — H&P (Signed)
Date: 10/18/2014               Patient Name:  [name] MRN: [id number]  DOB: 18-Dec-1928 Age / Sex: 81 y.o., female   PCP: Tamsen Roers, MD         Medical Service: Internal Medicine Teaching Service         Attending Physician: Dr. Bertha Stakes, MD    First Contact: Dr. Ethelene Hal Pager: [phone]  Second Contact: Dr. Gordy Levan Pager: [phone]       After Hours (After 5p/  First Contact Pager: [phone]  weekends / holidays): Second Contact Pager: [phone]   Chief Complaint: Cough and chills x1 week  History of Present Illness: Ms. Kugelman is a 81 year old female with PMH of cryptogenic cirrhosis, CAD disease s/p PCI and CVA, HTN, hx of GIB, HL presents to the ED via EMS for worsening cough and chills x1 week.  Her husband provided most of the history. He says she was in her usual state of health until about a week ago when she developed increased SOB. About 3 days ago she also developed a cough with scant production of clear sputum. She also has been weak with decreased appetite over this time period. She is on chronic home o2 2L [place] (family unsure why). She does not have any pain and specifically denies any fevers, chills, night sweats, chest pain, nausea, emesis, diarrhea, constipation, dysuria, polyuria.  In the ED, she received NS IV infusion, ceftriaxone 1 g iv once, and duoneb once. She was found in the ED to have large R pleural effusion.   Attempted to contact PCP Dr Tamsen Roers but office closed on Fridays.  Meds: Current Facility-Administered Medications  Medication Dose Route Frequency Provider Last Rate Last Dose  . cefTRIAXone (ROCEPHIN) 1 g in dextrose 5 % 50 mL IVPB  1 g Intravenous Once Francine Graven, DO 100 mL/hr at 10/18/14 1142 1 g at 10/18/14 1142   Current Outpatient Prescriptions  Medication Sig Dispense Refill  . diazepam (VALIUM) 5 MG tablet Take 5 mg by mouth as needed.     . ferrous sulfate 325 (65 FE) MG tablet Take 1 tablet (325 mg total) by mouth 3  (three) times daily with meals. 90 tablet 0  . furosemide (LASIX) 40 MG tablet Take 40 mg by mouth daily.    . montelukast (SINGULAIR) 10 MG tablet Take 10 mg by mouth daily as needed (allergies).     Marland Kitchen omeprazole (PRILOSEC) 20 MG capsule Take 20 mg by mouth daily.     . polyethylene glycol (MIRALAX / GLYCOLAX) packet Take 17 g by mouth daily as needed. 14 each 0  . VENTOLIN HFA 108 (90 BASE) MCG/ACT inhaler Inhale 2 puffs into the lungs every 4 (four) hours as needed for wheezing.     . bisacodyl (DULCOLAX) 10 MG suppository Place 1 suppository (10 mg total) rectally daily as needed. (Patient not taking: Reported on 10/18/2014) 12 suppository 0  . docusate sodium 100 MG CAPS Take 100 mg by mouth 2 (two) times daily. (Patient not taking: Reported on 10/18/2014) 10 capsule 0  . enoxaparin (LOVENOX) 40 MG/0.4ML injection Inject 0.4 mLs (40 mg total) into the skin daily. (Patient not taking: Reported on 10/18/2014) 14 Syringe 0  . HYDROcodone-acetaminophen (NORCO) 5-325 MG per tablet Take 1-2 tablets by mouth every 6 (six) hours as needed for pain. MAXIMUM TOTAL ACETAMINOPHEN DOSE IS 4000 MG PER DAY (Patient not taking: Reported on 10/18/2014) 30 tablet 0  .  metoprolol succinate (TOPROL-XL) 12.5 mg TB24 Take 0.5 tablets (12.5 mg total) by mouth daily. (Patient not taking: Reported on 10/18/2014) 60 tablet 0  . nitroGLYCERIN (NITROSTAT) 0.4 MG SL tablet Place 0.4 mg under the tongue every 5 (five) minutes as needed. Chest pain    . sennosides-docusate sodium (SENOKOT-S) 8.6-50 MG tablet Take 2 tablets by mouth daily. (Patient not taking: Reported on 10/18/2014) 30 tablet 1   Allergies: Allergies as of 10/18/2014 - Review Complete 10/18/2014  Allergen Reaction Noted  . Codeine  11/06/2009   Past Medical History  Diagnosis Date  . Coronary artery disease     s/p PCI in 1999 with peri-cath CVA, previous stress test  which showed ischemia in the inferior and inferolateral wall--> refused cardiac catheterization  and has been treated medically though does not want to take statin or ASA  . CVA (cerebral vascular accident)   . GERD (gastroesophageal reflux disease)   . Hypertension   . Femoral neck fracture 12/2008    right  . Asthma   . Hyperlipidemia   . Breast cancer   . Cryptogenic cirrhosis     Dr. Deatra Ina  . GI bleed Mar 2012    transfused 4 units PRBCs EGD showed bleed at duodenal bulb.  Plavix was D/C at this time.   . SBO (small bowel obstruction) 2013  . Peripheral edema   . On home O2   . Confusion   . Malnutrition    Past Surgical History  Procedure Laterality Date  . Hemiarthroplasty hip    . Cardiac catheterization    . Bilateral mastectomy    . Abdominal hysterectomy    . Laparotomy  10/29/2011    Procedure: EXPLORATORY LAPAROTOMY;  Surgeon: Merrie Roof, MD;  Location: [place];  Service: General;  Laterality: N/A;  EXPLORATORY LAPAROTOMY, LYSIS OF ADHESIONS, PERITONEAL WASHINGS.  . Hip pinning,cannulated Left 01/15/2013    Procedure: CANNULATED HIP PINNING;  Surgeon: Johnny Bridge, MD;  Location: [place];  Service: Orthopedics;  Laterality: Left;   Family History  Problem Relation Age of Onset  . Heart attack Son     x 2   History   Social History  . Marital Status: Married    Spouse Name: N/A  . Number of Children: N/A  . Years of Education: N/A   Occupational History  . Not on file.   Social History Main Topics  . Smoking status: Never Smoker   . Smokeless tobacco: Never Used  . Alcohol Use: No  . Drug Use: No  . Sexual Activity: Not on file   Other Topics Concern  . Not on file   Social History Narrative    Review of Systems: A comprehensive review of systems was negative except for: as noted above in HPI  Physical Exam: Blood pressure 107/44, pulse 85, temperature 96.6 F (35.9 C), temperature source Rectal, resp. rate 17, SpO2 96 %.  Gen: Moaning, covered with sheets, very frail/cachectic  HEENT: Atraumatic, sunken eyes, PERRL, EOMI, sclerae  anicteric, moist mucous membranes Heart: Regular rate and rhythm, normal S1 S2, no murmurs, rubs, or gallops Lungs: Very poor effort and hard to hear over moaning, faint wheezing, decreased sound on R lung base, respirations unlabored Abd: Soft, non-tender, distended with small fluid wave, + bowel sounds, no hepatosplenomegaly, no Murphy sign, small ventral hernia that was reducible Ext: B/l 2+ pitting edema in feet Neuro: A&O x 4, CN II-XII intact, strength 5/5 and symmetric in all extremities, sensation grossly  intact  Lab results: Basic Metabolic Panel:  Recent Labs  10/18/14 0908  NA 136  K 4.5  CL 98  CO2 34*  GLUCOSE 103*  BUN 16  CREATININE 0.70  CALCIUM 8.7   Liver Function Tests:  Recent Labs  10/18/14 0908  AST 18  ALT 10  ALKPHOS 55  BILITOT 0.9  PROT 6.4  ALBUMIN 2.6*    Recent Labs  10/18/14 0908  AMMONIA 36*   CBC:  Recent Labs  10/18/14 0908  WBC 7.2  NEUTROABS 5.3  HGB 10.8*  HCT 34.4*  MCV 80.6  PLT 121*   Cardiac Enzymes:  Recent Labs  10/18/14 0908  TROPONINI <0.03   Coagulation:  Recent Labs  10/18/14 0908  LABPROT 16.3*  INR 1.30   Urinalysis:  Recent Labs  10/18/14 1013  COLORURINE AMBER*  LABSPEC 1.022  PHURINE 6.0  GLUCOSEU NEGATIVE  HGBUR MODERATE*  BILIRUBINUR SMALL*  KETONESUR NEGATIVE  PROTEINUR NEGATIVE  UROBILINOGEN 0.2  NITRITE NEGATIVE  LEUKOCYTESUR MODERATE*  Few bacteria, few squamous epithelial cells   Lactic acid 1.3  Imaging results:  Dg Chest Port 1 View  10/18/2014   CLINICAL DATA:  Shortness of breath and congestion. See history of COPD.  EXAM: PORTABLE CHEST - 1 VIEW  COMPARISON:  PA and lateral chest 09/19/2013.  FINDINGS: The patient has a large right pleural effusion and extensive airspace disease. Very small left effusion is noted. The lungs are emphysematous. Heart size is normal.  IMPRESSION: Large right pleural effusion and extensive airspace disease could be due to to pneumonia  cannot be definitively characterized. If the abnormality does not clear, short-term follow-up chest CT with contrast is recommended for further evaluation.   Electronically Signed   By: Inge Rise M.D.   On: 10/18/2014 08:55   Other results: EKG: atrial fibrillation, no ST or t-wave changes compared to prior 01/14/13  Assessment & Plan by Problem: Principal Problem:   Pleural effusion, right Active Problems:   CAD (coronary artery disease)   Cirrhosis of liver   Protein-calorie malnutrition, severe  #R pleural effusion: Ms Lennartz presented with one week of cough, SOB. She is on 2L home O2 but was charted with 98% on RA. CXR demonstrates large R pleural effusion and extensive airspace disease. This could be due to pneumonia which would be CAP but she has 0/4 SIRS criteria. There is also concern given history of cirrhosis that she has hepatic hydrothorax related to her cirrhosis. She has no prior echo in our system but has known CAD and BNP 192. -CT chest -azithromycin 500 mg iv once(already got ceftriaxone in ED) -duoneb q6hprn -procalcitonin -urine strep pneumo and legionella antigens -sputum culture -TSH -echo -lasix 40 mg iv once and reassess -daily weights, I&Os -PT/OT -O2 supplementation as needed  #Cryptogenic cirrhosis: She had an exploratory laparotomy in 2013 for SBO and observed to have liver cirrhosis. She was then referred to Dr Erskine Emery of GI who called this cryptogenic cirrhosis with hypoalbuminemia and ascites. She has not been seen since as husband says doctor wanted to scope her and they thought this was too invasive. She had u/s guided paracentesis 10/2011. Her LFTs here are AST 18, ALT 10, alkaline phosphatase 55, total bilirubin 0.9, and low hypoalbuminemia at 2.6.   #Asymptomatic bacteruria: UA with moderate leukocyte, negative nitrite, few bacteria but few squam. She has no dysuria, polyuria so this is asymptomatic bacteruria.  #CAD s/p PCI and peri-cath  CVA: Ms Talerico is s/p PCI in  1999 with peri-cath CVA, previous stress test  which showed ischemia in the inferior and inferolateral wall. She has refused cardiac catheterization and has been treated medically though does not want to take statin or ASA. At home she is on toprol-xl 12.5 mg daily, lasix 40 mg daily. She was last seen by Dr Esmond Plants 10/2012 -cont toprol-xl 12.5 mg daily -lasix 40 mg iv once  #HTN: BP here 100s-140s/40s-60s. At home she is on lasix 40 mg daily, toprol-xl 12.5 mg daily. -lasix 40 mg iv once and reassess -toprol-xl 12.5 mg daily  #Atrial fibrillation: Ms Rogowski does not have prior documented history of atrial fibrillation in our EMR. She is asymptomatic. CHADS2VASC score is 7 which is 11% stroke risk per year. She is rate controlled with metoprolol. has lovenox 40 mg [place] daily on her medicine list in EMR but husband says she does not take it. -ask patient if they want anti-coagulation but per conversation they will likely defer to PCP -tropol-xl 12.5 mg daily  #Breast cancer: She had breast cancer (specific diagnosis and staging unknown) about 30-40 years ago (per husband who says it was before they met 19 years ago). She elected for bilateral mastectomy and has had no issues since.  #H/o GIB: Ms Leisey had a bleed in her duodenal bulb in 2012 requiring 4 U PRBC transfusion.  -cont to monitor  #Normocytic Anemia: Presenting hemoglobin 10.8 with MCV 81. Last hemoglobin in our system 8.8 in 01/2013 (hard to tell baseline as very fluctuating). No anemia panel in our record. -ferrous sulfate 325 mg po tid  #HL: No lipid panel in record and per EMR Ms Vanderheiden has refused statin in the past.  #Diet: regular  #DVT PPx: heparin 5000 u [place] tid  #Code: DNR (confirmed with patient and husband)  Dispo: Disposition is deferred at this time, awaiting improvement of current medical problems. Anticipated discharge in approximately 2-3 day(s).   The patient does have a current  PCP Tamsen Roers, MD) and does not need an [facility] hospital follow-up appointment after discharge.  The patient does not know have transportation limitations that hinder transportation to clinic appointments.  Signed: Wilber Oliphant, MD 10/18/2014, 11:44 AM

## 2014-10-18 NOTE — Progress Notes (Signed)
Internal Medicine Attending Admission Note Date: 10/18/2014  Patient name: [name] Medical record number: [id number] Date of birth: 09/06/1928 Age: 81 y.o. Gender: female  I saw and evaluated the patient. I reviewed the resident's note and I agree with the resident's findings and plan as documented in the resident's note, with the following additional comments.  Chief Complaint(s): Increased shortness of breath, cough  History - key components related to admission: Patient is an 81 year old woman with history of cryptogenic cirrhosis, coronary artery disease, hypertension, CVA, and other problems as outlined in the medical history admitted with a one-week history of cough, with increased shortness of breath over the 3 days prior to admission.  Husband also reports that she has been more weak, with decreased appetite.  Physical Exam - key components related to admission:  Filed Vitals:   10/18/14 1115 10/18/14 1211 10/18/14 1319 10/18/14 1842  BP: 123/56 121/51 114/38   Pulse: 88 80 83 95  Temp:   97.8 F (36.6 C)   TempSrc:   Oral   Resp: 21 19    Height:   5' (1.524 m)   Weight:   96 lb (43.545 kg)   SpO2: 100% 98% 95%     Gen.: Alert, frail-appearing, hard of hearing Lungs: Decreased breath sounds right lower lung field Heart: Regular; no extra sounds or murmurs Abdomen: Bowel sounds present, soft, nontender Extremities: 1+ bilateral pedal edema  Lab results:   Basic Metabolic Panel:  Recent Labs  10/18/14 0908  NA 136  K 4.5  CL 98  CO2 34*  GLUCOSE 103*  BUN 16  CREATININE 0.70  CALCIUM 8.7    Liver Function Tests:  Recent Labs  10/18/14 0908  AST 18  ALT 10  ALKPHOS 55  BILITOT 0.9  PROT 6.4  ALBUMIN 2.6*     Recent Labs  10/18/14 0908  AMMONIA 36*    CBC:  Recent Labs  10/18/14 0908  WBC 7.2  HGB 10.8*  HCT 34.4*  MCV 80.6  PLT 121*    Recent Labs  10/18/14 0908  NEUTROABS 5.3  LYMPHSABS 0.8  MONOABS 0.6  EOSABS 0.3   BASOSABS 0.1    Cardiac Enzymes:  Recent Labs  10/18/14 0908  TROPONINI <0.03     Thyroid Function Tests:  Recent Labs  10/18/14 1548  TSH 3.533     Coagulation:  Recent Labs  10/18/14 0908  INR 1.30      Urinalysis    Component Value Date/Time   COLORURINE AMBER* 10/18/2014 1013   APPEARANCEUR CLEAR 10/18/2014 1013   LABSPEC 1.022 10/18/2014 1013   PHURINE 6.0 10/18/2014 1013   GLUCOSEU NEGATIVE 10/18/2014 1013   HGBUR MODERATE* 10/18/2014 1013   BILIRUBINUR SMALL* 10/18/2014 1013   KETONESUR NEGATIVE 10/18/2014 1013   PROTEINUR NEGATIVE 10/18/2014 1013   UROBILINOGEN 0.2 10/18/2014 1013   NITRITE NEGATIVE 10/18/2014 1013   LEUKOCYTESUR MODERATE* 10/18/2014 1013    Urine microscopic:  Recent Labs  10/18/14 1013  EPIU FEW*  WBCU 7-10  RBCU 7-10  BACTERIA FEW*      Imaging results:  Dg Chest Port 1 View  10/18/2014   CLINICAL DATA:  Shortness of breath and congestion. See history of COPD.  EXAM: PORTABLE CHEST - 1 VIEW  COMPARISON:  PA and lateral chest 09/19/2013.  FINDINGS: The patient has a large right pleural effusion and extensive airspace disease. Very small left effusion is noted. The lungs are emphysematous. Heart size is normal.  IMPRESSION: Large right pleural  effusion and extensive airspace disease could be due to to pneumonia cannot be definitively characterized. If the abnormality does not clear, short-term follow-up chest CT with contrast is recommended for further evaluation.   Electronically Signed   By: Inge Rise M.D.   On: 10/18/2014 08:55    Other results: EKG: Atrial fibrillation; anterior infarct, old  Assessment & Plan by Problem:  1.  Large right pleural effusion and extensive airspace disease by chest x-ray.  The etiology is not clear; the differential includes pneumonia.  Plans include empiric IV antibiotic treatment with ceftriaxone and azithromycin; CT scan of the chest; supplemental oxygen and follow  saturations; consider diagnostic/therapeutic thoracentesis depending upon CT scan findings and clinical course.  2.  Atrial fibrillation.  This has apparently not been previously documented.  Discussion regarding anticoagulation for stroke prophylaxis would have to take into account the risk of anticoagulation including history of GI bleed and the potential benefit.  Plans include monitor; 2-D echocardiogram; continue beta blocker.  3.  Other problems and plans as per the resident physician's note.  4.  CODE STATUS.  I spoke with patient's husband, who confirmed DNR code status.

## 2014-10-18 NOTE — ED Notes (Signed)
Pt. Having cough and chills for 1 week.  Progressively getting worse. Pt. Is on Home oxygen 2 liters, paramedics increased to oxygen to 3L. GCS of 14.

## 2014-10-18 NOTE — Progress Notes (Signed)
Discussed placement of foley with MD due to incontinence with stage 1 sacral pressure and order for IV lasix.  MD does not wish for foley to be placed at this time due to concern over UTI.  Will con't plan of care.

## 2014-10-19 ENCOUNTER — Inpatient Hospital Stay (HOSPITAL_COMMUNITY): Payer: Medicare Other

## 2014-10-19 LAB — BODY FLUID CELL COUNT WITH DIFFERENTIAL
Eos, Fluid: 0 %
LYMPHS FL: 36 %
Monocyte-Macrophage-Serous Fluid: 68 % (ref 50–90)
NEUTROPHIL FLUID: 1 % (ref 0–25)
Total Nucleated Cell Count, Fluid: 81 cu mm (ref 0–1000)

## 2014-10-19 LAB — MAGNESIUM: Magnesium: 1.7 mg/dL (ref 1.5–2.5)

## 2014-10-19 LAB — BASIC METABOLIC PANEL
Anion gap: 8 (ref 5–15)
BUN: 13 mg/dL (ref 6–23)
CALCIUM: 8 mg/dL — AB (ref 8.4–10.5)
CHLORIDE: 97 mmol/L (ref 96–112)
CO2: 33 mmol/L — ABNORMAL HIGH (ref 19–32)
CREATININE: 0.71 mg/dL (ref 0.50–1.10)
GFR calc Af Amer: 90 mL/min — ABNORMAL LOW (ref >90)
GFR, EST NON AFRICAN AMERICAN: 78 mL/min — AB (ref >90)
Glucose, Bld: 94 mg/dL (ref 70–99)
Potassium: 3.8 mmol/L (ref 3.5–5.1)
SODIUM: 138 mmol/L (ref 135–145)

## 2014-10-19 LAB — CBC
HCT: 29.6 % — ABNORMAL LOW (ref 36.0–46.0)
HEMOGLOBIN: 9.4 g/dL — AB (ref 12.0–15.0)
MCH: 25.5 pg — AB (ref 26.0–34.0)
MCHC: 31.8 g/dL (ref 30.0–36.0)
MCV: 80.2 fL (ref 78.0–100.0)
Platelets: 109 10*3/uL — ABNORMAL LOW (ref 150–400)
RBC: 3.69 MIL/uL — AB (ref 3.87–5.11)
RDW: 16.3 % — ABNORMAL HIGH (ref 11.5–15.5)
WBC: 4.9 10*3/uL (ref 4.0–10.5)

## 2014-10-19 LAB — URINE CULTURE: Colony Count: 2000

## 2014-10-19 LAB — PROTEIN, BODY FLUID: Total protein, fluid: <3 g/dL

## 2014-10-19 LAB — LACTATE DEHYDROGENASE, PLEURAL OR PERITONEAL FLUID: LD, Fluid: 31 U/L — ABNORMAL HIGH (ref 3–23)

## 2014-10-19 LAB — PHOSPHORUS: PHOSPHORUS: 3.5 mg/dL (ref 2.3–4.6)

## 2014-10-19 LAB — AMYLASE, PLEURAL FLUID: AMYLASE, PLEURAL FLUID: 9 U/L

## 2014-10-19 LAB — GLUCOSE, SEROUS FLUID: GLUCOSE FL: 120 mg/dL

## 2014-10-19 MED ORDER — FUROSEMIDE 40 MG PO TABS
40.0000 mg | ORAL_TABLET | Freq: Every day | ORAL | Status: DC
  Administered 2014-10-19 – 2014-10-21 (×3): 40 mg via ORAL
  Filled 2014-10-19 (×3): qty 1

## 2014-10-19 MED ORDER — CEFTRIAXONE SODIUM IN DEXTROSE 20 MG/ML IV SOLN
1.0000 g | Freq: Once | INTRAVENOUS | Status: AC
  Administered 2014-10-19: 1 g via INTRAVENOUS
  Filled 2014-10-19: qty 50

## 2014-10-19 MED ORDER — SPIRONOLACTONE 100 MG PO TABS
100.0000 mg | ORAL_TABLET | Freq: Every day | ORAL | Status: DC
  Administered 2014-10-20 – 2014-10-21 (×2): 100 mg via ORAL
  Filled 2014-10-19 (×3): qty 1

## 2014-10-19 MED ORDER — AZITHROMYCIN 500 MG IV SOLR
500.0000 mg | Freq: Once | INTRAVENOUS | Status: AC
  Administered 2014-10-19: 500 mg via INTRAVENOUS
  Filled 2014-10-19: qty 500

## 2014-10-19 MED ORDER — CEFTRIAXONE SODIUM IN DEXTROSE 20 MG/ML IV SOLN
1.0000 g | INTRAVENOUS | Status: DC
  Administered 2014-10-20: 1 g via INTRAVENOUS
  Filled 2014-10-19 (×2): qty 50

## 2014-10-19 MED ORDER — CETYLPYRIDINIUM CHLORIDE 0.05 % MT LIQD
7.0000 mL | Freq: Two times a day (BID) | OROMUCOSAL | Status: DC
  Administered 2014-10-19 (×2): 7 mL via OROMUCOSAL

## 2014-10-19 MED ORDER — FERROUS SULFATE 325 (65 FE) MG PO TABS
325.0000 mg | ORAL_TABLET | Freq: Every day | ORAL | Status: DC
  Administered 2014-10-20 – 2014-10-21 (×2): 325 mg via ORAL
  Filled 2014-10-19 (×3): qty 1

## 2014-10-19 MED ORDER — LIDOCAINE HCL (PF) 1 % IJ SOLN
INTRAMUSCULAR | Status: AC
  Filled 2014-10-19: qty 10

## 2014-10-19 MED ORDER — DEXTROSE 5 % IV SOLN
500.0000 mg | INTRAVENOUS | Status: DC
  Administered 2014-10-20: 500 mg via INTRAVENOUS
  Filled 2014-10-19 (×2): qty 500

## 2014-10-19 NOTE — Evaluation (Signed)
Physical Therapy Evaluation Patient Details Name: [name] MRN: [id number] DOB: 1928-12-13 Today's Date: 10/19/2014   History of Present Illness  81 yo frail female with pleural effusion and ascites, very involved husband  Clinical Impression  Pt was seen for initial mobility with husband present to observe and get involved.  He is reluctant to have her to go SNF although she initiates no mobility and cannot support in even sitting.  Her plan may default to HHPT but requires more assistance than that at this point.    Follow Up Recommendations SNF;Supervision/Assistance - 24 hour    Equipment Recommendations  None recommended by PT    Recommendations for Other Services       Precautions / Restrictions Precautions Precautions: Fall Precaution Comments: Pt needs to be positioned upright Restrictions Weight Bearing Restrictions: No      Mobility  Bed Mobility Overal bed mobility: Needs Assistance Bed Mobility: Supine to Sit     Supine to sit: Mod assist;Max assist     General bed mobility comments: Pt needs to be supervised and assisted under trunk and legs  Transfers Overall transfer level: Needs assistance Equipment used: Rolling walker (2 wheeled);1 person hand held assist Transfers: Sit to/from [facility] Sit to Stand: Mod assist;Max assist Stand pivot transfers: Mod assist       General transfer comment: Pt will wb through legs to perform the task but is taking short untsteady steps to control   Ambulation/Gait             General Gait Details: unable  Stairs            Wheelchair Mobility    Modified Rankin (Stroke Patients Only)       Balance Overall balance assessment: Needs assistance Sitting-balance support: Feet supported Sitting balance-Leahy Scale: Poor   Postural control: Posterior lean Standing balance support: Bilateral upper extremity supported Standing balance-Leahy Scale: Poor Standing balance  comment: lethargic and weak generally                             Pertinent Vitals/Pain Pain Assessment: Faces Pain Score: 3  Faces Pain Scale: Hurts little more Pain Location: general stiffness Pain Intervention(s): Limited activity within patient's tolerance;Monitored during session;Repositioned    Home Living Family/patient expects to be discharged to:: Private residence Living Arrangements: Spouse/significant other Available Help at Discharge: Family Type of Home: House Home Access: Stairs to enter Entrance Stairs-Rails: Right;Left;Can reach both Entrance Stairs-Number of Steps: 2 Home Layout: One level Home Equipment: Environmental consultant - 2 wheels;Shower seat;Bedside commode      Prior Function Level of Independence: Needs assistance   Gait / Transfers Assistance Needed: RW with supervision and min assist on stairs  ADL's / Homemaking Assistance Needed: husband cares for house        Hand Dominance        Extremity/Trunk Assessment   Upper Extremity Assessment: Generalized weakness           Lower Extremity Assessment: Generalized weakness      Cervical / Trunk Assessment: Kyphotic  Communication   Communication: HOH;Expressive difficulties;Other (comment) (Mouth breathing with limited verbalizations)  Cognition Arousal/Alertness: Lethargic Behavior During Therapy: Flat affect Overall Cognitive Status: History of cognitive impairments - at baseline       Memory:  (Difficult to determine)              General Comments General comments (skin integrity, edema, etc.): Pt is tremendously  weak with FTT appearance, husband extensively complaining about previous SNF experience and wants to take her home.  This may be ill-advised and encouraged him to speak with SW first to help decide.      Exercises        Assessment/Plan    PT Assessment Patient needs continued PT services  PT Diagnosis Generalized weakness   PT Problem List Decreased  strength;Decreased range of motion;Decreased activity tolerance;Decreased balance;Decreased mobility;Decreased coordination;Decreased cognition;Decreased knowledge of use of DME;Decreased safety awareness;Cardiopulmonary status limiting activity;Decreased skin integrity  PT Treatment Interventions DME instruction;Gait training;Stair training;Functional mobility training;Therapeutic activities;Therapeutic exercise;Balance training;Neuromuscular re-education;Cognitive remediation;Patient/family education   PT Goals (Current goals can be found in the Care Plan section) Acute Rehab PT Goals Patient Stated Goal: none stated PT Goal Formulation: With patient/family Time For Goal Achievement: 11/02/14 Potential to Achieve Goals: Fair    Frequency Min 3X/week   Barriers to discharge Inaccessible home environment;Decreased caregiver support (Husband is elderly)      Co-evaluation               End of Session Equipment Utilized During Treatment: Oxygen Activity Tolerance: Patient limited by fatigue;Patient limited by lethargy Patient left: in chair;with call bell/phone within reach;with family/visitor present;with chair alarm set Nurse Communication: Mobility status         Time: 0820-0859 PT Time Calculation (min) (ACUTE ONLY): 39 min   Charges:   PT Evaluation $Initial PT Evaluation Tier I: 1 Procedure PT Treatments $Therapeutic Activity: 23-37 mins   PT G CodesRamond Dial 11-18-2014, 9:38 AM  Mee Hives, PT MS Acute Rehab Dept. Number: [phone]

## 2014-10-19 NOTE — Evaluation (Signed)
Clinical/Bedside Swallow Evaluation Patient Details  Name: [name] MRN: [id number] Date of Birth: 27-Aug-1928  Today's Date: 10/19/2014 Time: SLP Start Time (ACUTE ONLY): 1520 SLP Stop Time (ACUTE ONLY): 1600 SLP Time Calculation (min) (ACUTE ONLY): 40 min  Past Medical History:  Past Medical History  Diagnosis Date  . Coronary artery disease     s/p PCI in 1999 with peri-cath CVA, previous stress test  which showed ischemia in the inferior and inferolateral wall--> refused cardiac catheterization and has been treated medically though does not want to take statin or ASA  . CVA (cerebral vascular accident)   . GERD (gastroesophageal reflux disease)   . Hypertension   . Femoral neck fracture 12/2008    right  . Asthma   . Hyperlipidemia   . Breast cancer   . Cryptogenic cirrhosis     Dr. Deatra Ina  . GI bleed Mar 2012    transfused 4 units PRBCs EGD showed bleed at duodenal bulb.  Plavix was D/C at this time.   . SBO (small bowel obstruction) 2013  . Peripheral edema   . On home O2   . Confusion   . Malnutrition   . Pneumonia     "I've had it twice" (10/18/2014)  . Pleural effusion on right 10/18/2014   Past Surgical History:  Past Surgical History  Procedure Laterality Date  . Cardiac catheterization    . Abdominal hysterectomy    . Laparotomy  10/29/2011    Procedure: EXPLORATORY LAPAROTOMY;  Surgeon: Merrie Roof, MD;  Location: [facility];  Service: General;  Laterality: N/A;  EXPLORATORY LAPAROTOMY, LYSIS OF ADHESIONS, PERITONEAL WASHINGS.  . Hip pinning,cannulated Left 01/15/2013    Procedure: CANNULATED HIP PINNING;  Surgeon: Johnny Bridge, MD;  Location: [facility];  Service: Orthopedics;  Laterality: Left;  Marland Kitchen Mastectomy Bilateral    HPI:  Patient is an 81 year old woman with history of cryptogenic cirrhosis, coronary artery disease, hypertension, CVA, and other problems as outlined in the medical history admitted with a one-week history of cough, with increased shortness of  breath over the 3 days prior to admission. Husband also reports that she has been more weak, with decreased appetite.   Assessment / Plan / Recommendation Clinical Impression  Patient presents with a moderate, primary oral dysphagia, characterized by decreased bolus formation, delays in oral transit of solid texture boluses, oral holding and delays in mastication. Patient's husband stated that at home he has been cutting up her food and offering mostly soft, tender foods that do not require as much chewing. He stated that she has always been a slow eater.     Aspiration Risk  Mild    Diet Recommendation Dysphagia 2 (Fine chop);Thin liquid   Liquid Administration via: Cup;Straw Medication Administration: Whole meds with puree Supervision: Full supervision/cueing for compensatory strategies;Trained caregiver to feed patient;Staff to assist with self feeding Compensations: Slow rate;Small sips/bites;Check for pocketing Postural Changes and/or Swallow Maneuvers: Seated upright 90 degrees;Upright 30-60 min after meal    Other  Recommendations     Follow Up Recommendations  Home health SLP;24 hour supervision/assistance    Frequency and Duration min 2x/week  2 weeks   Pertinent Vitals/Pain     SLP Swallow Goals     Swallow Study Prior Functional Status       General Date of Onset: 10/18/14 HPI: Patient is an 81 year old woman with history of cryptogenic cirrhosis, coronary artery disease, hypertension, CVA, and other problems as outlined in the medical history admitted  with a one-week history of cough, with increased shortness of breath over the 3 days prior to admission. Husband also reports that she has been more weak, with decreased appetite. Type of Study: Bedside swallow evaluation Previous Swallow Assessment: 01/16/2013 (MBSS) Diet Prior to this Study: NPO Temperature Spikes Noted: No Respiratory Status: Nasal cannula History of Recent Intubation: No Behavior/Cognition:  Alert;Cooperative;Hard of hearing;Other (comment) (fatigued) Oral Cavity - Dentition: Dentures, top;Dentures, bottom (bottom dentures very loose) Self-Feeding Abilities: Needs assist;Total assist;Needs set up Patient Positioning: Upright in bed Baseline Vocal Quality: Low vocal intensity;Clear Volitional Cough: Weak Volitional Swallow: Unable to elicit    Oral/Motor/Sensory Function Overall Oral Motor/Sensory Function: Impaired Labial Strength: Reduced Lingual Strength: Reduced   Ice Chips Ice chips: Not tested   Thin Liquid Thin Liquid: Impaired Presentation: Straw;Cup Oral Phase Impairments: Reduced labial seal    Nectar Thick Nectar Thick Liquid: Not tested   Honey Thick Honey Thick Liquid: Not tested   Puree Puree: Impaired Oral Phase Impairments: Reduced lingual movement/coordination;Impaired anterior to posterior transit Oral Phase Functional Implications: Oral holding;Prolonged oral transit   Solid   GO    Solid: Impaired Oral Phase Impairments: Reduced lingual movement/coordination;Impaired anterior to posterior transit;Impaired mastication;Poor awareness of bolus Oral Phase Functional Implications: Oral holding       [name] 10/19/2014,6:10 PM  Maria Monarch, MA, CCC-SLP

## 2014-10-19 NOTE — Progress Notes (Signed)
Utilization review complete 

## 2014-10-19 NOTE — Progress Notes (Signed)
Subjective: [name] was sitting in a chair with her husband and son present. Her husband says that she looked even more improved this morning eating breakfast but since then she is sitting in the chair moaning. I explained that she has a fluid collection in her lung that is likely causing her this discomfort and that we would like to drain it. We would then run tests on the fluid we collect to see whether this is an infection, related to her liver, or something else. The son and husband were both agreeable and this was explained to [name].  Objective: Vital signs in last 24 hours: Filed Vitals:   10/18/14 2150 10/19/14 0433 10/19/14 0436 10/19/14 0918  BP: 120/47 112/52    Pulse: 90 91    Temp: 97.6 F (36.4 C) 98.2 F (36.8 C)    TempSrc: Oral Oral    Resp: 18 24    Height:      Weight:  96 lb 5.5 oz (43.7 kg)    SpO2: 96% 88% 95% 96%   Weight change:   Intake/Output Summary (Last 24 hours) at 10/19/14 1114 Last data filed at 10/19/14 0858  Gross per 24 hour  Intake    120 ml  Output      0 ml  Net    120 ml   Gen: Moaning, covered with sheets sitting in chair, very frail/cachectic  HEENT: Atraumatic, sunken eyes, PERRL, EOMI, sclerae anicteric, moist mucous membranes Heart: Regular rate and rhythm, normal S1 S2, no murmurs, rubs, or gallops Lungs: Very poor effort and hard to hear over moaning, faint wheezing, decreased sound on R lung base, respirations unlabored Abd: Soft, non-tender, distended with fluid wave, + bowel sounds, no hepatosplenomegaly, no Murphy sign, small ventral hernia that was reducible Ext: Pitting edema in feet largely resolved  Lab Results: Basic Metabolic Panel:  Recent Labs Lab 10/18/14 0908 10/18/14 2329 10/19/14 0325  NA 136  --  138  K 4.5  --  3.8  CL 98  --  97  CO2 34*  --  33*  GLUCOSE 103*  --  94  BUN 16  --  13  CREATININE 0.70  --  0.71  CALCIUM 8.7  --  8.0*  MG  --  1.7  --   PHOS  --  3.5  --    Liver Function  Tests:  Recent Labs Lab 10/18/14 0908  AST 18  ALT 10  ALKPHOS 55  BILITOT 0.9  PROT 6.4  ALBUMIN 2.6*    Recent Labs Lab 10/18/14 0908  AMMONIA 36*   CBC:  Recent Labs Lab 10/18/14 0908 10/19/14 0325  WBC 7.2 4.9  NEUTROABS 5.3  --   HGB 10.8* 9.4*  HCT 34.4* 29.6*  MCV 80.6 80.2  PLT 121* 109*   Cardiac Enzymes:  Recent Labs Lab 10/18/14 0908  TROPONINI <0.03   Thyroid Function Tests:  Recent Labs Lab 10/18/14 1548  TSH 3.533   Coagulation:  Recent Labs Lab 10/18/14 0908  LABPROT 16.3*  INR 1.30   Urinalysis:  Recent Labs Lab 10/18/14 1013  COLORURINE AMBER*  LABSPEC 1.022  PHURINE 6.0  GLUCOSEU NEGATIVE  HGBUR MODERATE*  BILIRUBINUR SMALL*  KETONESUR NEGATIVE  PROTEINUR NEGATIVE  UROBILINOGEN 0.2  NITRITE NEGATIVE  LEUKOCYTESUR MODERATE*   Misc. Labs: Procalcitonin <0.10 BNP 217  Micro Results: No results found for this or any previous visit (from the past 240 hour(s)). Studies/Results: Ct Chest W Contrast  10/18/2014  CLINICAL DATA:  Right pleural effusion.  EXAM: CT CHEST WITH CONTRAST  TECHNIQUE: Multidetector CT imaging of the chest was performed during intravenous contrast administration.  CONTRAST:  18m OMNIPAQUE IOHEXOL 300 MG/ML  SOLN  COMPARISON:  Chest radiograph of same day.  FINDINGS: No pneumothorax is noted. Mild left pleural effusion is noted. Large right pleural effusion is noted with atelectasis of the right lower lobe. Subsegmental atelectasis of right upper lobe is noted. There is no evidence of thoracic aortic dissection or aneurysm. Visualized portions of pulmonary arteries appear normal. Small sliding-type hiatal hernia is noted. No mediastinal mass or adenopathy is noted. No significant osseous abnormality is noted.  Severe hepatic cirrhosis is noted. Extensive collateral veins are seen in the splenic hilum consistent with portal hypertension. Moderate to severe ascites is noted as well.  IMPRESSION: Mild left  pleural effusion.  Large right pleural effusion is noted with atelectasis of the right lower lobe.  Severe hepatic cirrhosis is noted with evidence of portal hypertension and collateral circulation. Moderate to severe ascites is noted as well.   Electronically Signed   By: JMarijo Conception M.D.   On: 10/18/2014 20:31   Dg Chest Port 1 View  10/18/2014   CLINICAL DATA:  Shortness of breath and congestion. See history of COPD.  EXAM: PORTABLE CHEST - 1 VIEW  COMPARISON:  PA and lateral chest 09/19/2013.  FINDINGS: The patient has a large right pleural effusion and extensive airspace disease. Very small left effusion is noted. The lungs are emphysematous. Heart size is normal.  IMPRESSION: Large right pleural effusion and extensive airspace disease could be due to to pneumonia cannot be definitively characterized. If the abnormality does not clear, short-term follow-up chest CT with contrast is recommended for further evaluation.   Electronically Signed   By: TInge RiseM.D.   On: 10/18/2014 08:55   Medications: I have reviewed the patient's current medications. Scheduled Meds: . antiseptic oral rinse  7 mL Mouth Rinse BID  . azithromycin  500 mg Intravenous Once  . cefTRIAXone (ROCEPHIN)  IV  1 g Intravenous Once  . docusate sodium  100 mg Oral BID  . feeding supplement (ENSURE ENLIVE)  237 mL Oral BID BM  . ferrous sulfate  325 mg Oral TID WC  . furosemide  40 mg Oral Daily  . ipratropium-albuterol  3 mL Nebulization TID  . metoprolol succinate  12.5 mg Oral Daily  . pantoprazole  40 mg Oral Daily   Continuous Infusions:  PRN Meds:.albuterol, bisacodyl, diazepam, montelukast, polyethylene glycol Assessment/Plan: Principal Problem:   Pleural effusion, right Active Problems:   CAD (coronary artery disease)   Cirrhosis of liver   Protein-calorie malnutrition, severe   Cirrhosis, cryptogenic  #R pleural effusion: Maria FCorvinpresented with one week of cough, SOB. She is on 2L home O2 but  was charted with 98% on RA. CXR demonstrates large R pleural effusion and extensive airspace disease. Chest CT describes mild L pleural effusion and large R pleural effusion with atelectasis of the R lower lobe. This could be due to pneumonia which would be CAP but she has 0/4 SIRS criteria and procalcitonin negative. There is also concern given history of cirrhosis that she has hepatic hydrothorax related to her cirrhosis which on chest CT mentioned severe hepatic cirrhosis with evidence of portal HTN and collateral circulation with moderate to severe ascites. Echo was notable for EF 598-92% grade 1 diastolic dysfunction PA peak pressure 36 mmHg so acute CHF exacerbation unlikely. I  spoke with husband and son about a thoracentesis which would be both therapeutic and help with diagnosis. They were agreeable as was IR -appreciate IR -IR ultrasound guided thoracentesis -pleural fluid culture, gram stain, cell count with differential, glucose, protein, LDH, amylase, cytology, pH -cont azithromycin 500 mg iv daily and ceftriaxone 1 g iv daily -duoneb q6hprn -f/u urine strep pneumo and legionella antigens, sputum culture -daily weights, I&Os -PT/OT -O2 supplementation as needed  #Cryptogenic cirrhosis: She had an exploratory laparotomy in 2013 for SBO and observed to have liver cirrhosis. She was then referred to Dr Erskine Emery of GI who called this cryptogenic cirrhosis with hypoalbuminemia and ascites. She has not been seen since as husband says doctor wanted to scope her and they thought this was too invasive. She had u/s guided paracentesis 10/2011. Her LFTs here are AST 18, ALT 10, alkaline phosphatase 55, total bilirubin 0.9, and low hypoalbuminemia at 2.6. CT chest also noted evere hepatic cirrhosis with evidence of portal HTN and collateral circulation with moderate to severe ascites with ascites observed on exam. -cont to monitor -consider GI referral but patient husband is  resistant  #Asymptomatic bacteruria: UA with moderate leukocyte, negative nitrite, few bacteria but few squam. She has no dysuria, polyuria so this is asymptomatic bacteruria. -cont to monitor  #CAD s/p PCI and peri-cath CVA: [name] is s/p PCI in 1999 with peri-cath CVA, previous stress test which showed ischemia in the inferior and inferolateral wall. She has refused cardiac catheterization and has been treated medically though does not want to take statin or ASA. At home she is on toprol-xl 12.5 mg daily, lasix 40 mg daily. She was last seen by Dr Esmond Plants 10/2012 -cont toprol-xl 12.5 mg daily -resume lasix 40 mg po daily  #HTN: BP overnight 110s-120s/40s-50s. At home she is on lasix 40 mg daily, toprol-xl 12.5 mg daily. -toprol-xl 12.5 mg daily -resume lasix 40 mg po daily  #Atrial fibrillation: [name] does not have prior documented history of atrial fibrillation in our EMR. She is asymptomatic. CHADS2VASC score is 7 which is 11% stroke risk per year. She does have a history of GI bleeds. She is rate controlled with metoprolol. Has lovenox 40 mg [place] daily on her medicine list in EMR but husband says she does not take it. -patient should discuss anti-coagulation with PCP -tropol-xl 12.5 mg daily  #Breast cancer: She had breast cancer (specific diagnosis and staging unknown) about 30-40 years ago (per husband who says it was before they met 19 years ago). She elected for bilateral mastectomy and has had no issues since.  #H/o GIB: [name] had a bleed in her duodenal bulb in 2012 requiring 4 U PRBC transfusion.  -cont to monitor  #Normocytic Anemia: Presenting hemoglobin 10.8 with MCV 81 is now 9.4. Last hemoglobin in our system 8.8 in 01/2013 (hard to tell baseline as very fluctuating). No anemia panel in our record. -ferrous sulfate 325 mg po tid -cont to monitor  #HL: No lipid panel in record and per EMR [name] has refused statin in the past.  #Diet: regular  #DVT PPx:  heparin 5000 u  tid (hold for thoracentesis)  #Code: DNR (confirmed with patient and husband)  Dispo: Disposition is deferred at this time, awaiting improvement of current medical problems.  Anticipated discharge in approximately 1 day(s).   The patient does have a current PCP Tamsen Roers, MD) and does need an [facility] hospital follow-up appointment after discharge.  The patient does not know  have transportation limitations that hinder transportation to clinic appointments.  .Services Needed at time of discharge: Y = Yes, Blank = No PT:   OT:   RN:   Equipment:   Other:     LOS: 1 day   Kelby Aline, MD 10/19/2014, 11:14 AM

## 2014-10-19 NOTE — Procedures (Signed)
US guided diagnostic/therapeutic right thoracentesis performed yielding 1.5 liters turbid, yellow fluid. The fluid was sent to the lab for preordered studies. F/u CXR pending. No immediate complications. Only the above amount of fluid was removed today secondary this being pt's first thoracentesis and soft BP.

## 2014-10-19 NOTE — Progress Notes (Signed)
INITIAL NUTRITION ASSESSMENT Pt meets criteria for SEVERE MALNUTRITION in the context of CHRONIC ILLNESS as evidenced by SEVERE MUSCLE AND FAT LOSS. DOCUMENTATION CODES Per approved criteria  -Severe malnutrition in the context of chronic illness   INTERVENTION: -Magic cup BID with meals, each supplement provides 290 kcal and 9 grams of protein  -Ordered Snacks  -recommend Swallow evaluation   -Recommend providing pt mvi with minerals   NUTRITION DIAGNOSIS: Inadequate oral intake related to poor appetite as evidenced by severe muscle/fat loss.   Goal: Pt to meet >/= 90% of their estimated nutrition needs   Monitor:  Oral intake, weight, labs, snack preferences, fluid status  Reason for Assessment: Consult: Assessment of status  81 y.o. female  Admitting Dx: Pleural effusion, right  ASSESSMENT: 81 year old female with PMH of cryptogenic cirrhosis, CAD disease s/p PCI and CVA, HTN, hx of GIB, HL presents to the ED via EMS for worsening cough and chills x1 week.  Spoke to family members. Pt has poor appetite. She has sporadic intake and only eats small portions. No complaints of n/v or c/d, just is not hungry. Family member states she eats things like sugar wafers, half sandwich, and other low protein items.   Family members state her weight is usually ~100 pounds. Emr shows loss of about 25 lbs over the past 2 years. Wt loss is likely more severe than appears due to current excess fluid on pt.   Family members thought patient would eat snacks. Also magic cup since dysphagia 2 diet- Family members say pt has not had trouble eating? Recommend swallow eval.    Nutrition Focused Physical Exam:  Subcutaneous Fat:  Orbital Region: Severe fat loss Upper Arm Region: Severe fat loss Thoracic and Lumbar Region: N/A  Muscle:  Temple Region: Severe Muscle loss Clavicle Bone Region: Severe Muscle loss Clavicle and Acromion Bone Region: Severe Muscle loss Scapular Bone Region:  N/a Dorsal Hand: N/a Patellar Region: Severe muscle loss Anterior Thigh Region: n/a Posterior Calf Region: Severe muscle loss  Edema: None noted   Height: Ht Readings from Last 1 Encounters:  10/18/14 5' (1.524 m)    Weight: Wt Readings from Last 1 Encounters:  10/19/14 96 lb 5.5 oz (43.7 kg)    Ideal Body Weight: 100 lbs  % Ideal Body Weight: 96%  Wt Readings from Last 10 Encounters:  10/19/14 96 lb 5.5 oz (43.7 kg)  01/17/13 119 lb 1.6 oz (54.023 kg)  10/11/12 121 lb 8 oz (55.112 kg)  03/21/12 112 lb 4 oz (50.916 kg)  12/08/11 107 lb (48.535 kg)  11/17/11 141 lb 12.8 oz (64.32 kg)  11/09/11 140 lb (63.504 kg)  11/03/11 140 lb 11.2 oz (63.821 kg)  09/21/11 135 lb (61.236 kg)  11/12/10 138 lb (62.596 kg)    Usual Body Weight: 110-120 lbs  % Usual Body Weight: 87%  BMI:  Body mass index is 18.82 kg/(m^2).  Estimated Nutritional Needs: Kcal: 1500-1600(35-37 kcal/kg) Protein: 66-79 (15-1.8 g/kg) Fluid: 1.5-1.6 liters  Skin: ecchymosis  Diet Order: DIET DYS 2 Room service appropriate?: Yes; Fluid consistency:: Thin  EDUCATION NEEDS: -No education needs identified at this time   Intake/Output Summary (Last 24 hours) at 10/19/14 0852 Last data filed at 10/18/14 1800  Gross per 24 hour  Intake      0 ml  Output      0 ml  Net      0 ml    Last BM: Unknown   Labs:   Recent Labs  Lab 10/18/14 0908 10/18/14 2329 10/19/14 0325  NA 136  --  138  K 4.5  --  3.8  CL 98  --  97  CO2 34*  --  33*  BUN 16  --  13  CREATININE 0.70  --  0.71  CALCIUM 8.7  --  8.0*  MG  --  1.7  --   PHOS  --  3.5  --   GLUCOSE 103*  --  94    CBG (last 3)  No results for input(s): GLUCAP in the last 72 hours.  Scheduled Meds: . antiseptic oral rinse  7 mL Mouth Rinse BID  . docusate sodium  100 mg Oral BID  . feeding supplement (ENSURE ENLIVE)  237 mL Oral BID BM  . ferrous sulfate  325 mg Oral TID WC  . heparin  5,000 Units Subcutaneous 3 times per day  .  ipratropium-albuterol  3 mL Nebulization TID  . metoprolol succinate  12.5 mg Oral Daily  . pantoprazole  40 mg Oral Daily    Continuous Infusions:   Past Medical History  Diagnosis Date  . Coronary artery disease     s/p PCI in 1999 with peri-cath CVA, previous stress test  which showed ischemia in the inferior and inferolateral wall--> refused cardiac catheterization and has been treated medically though does not want to take statin or ASA  . CVA (cerebral vascular accident)   . GERD (gastroesophageal reflux disease)   . Hypertension   . Femoral neck fracture 12/2008    right  . Asthma   . Hyperlipidemia   . Breast cancer   . Cryptogenic cirrhosis     Dr. Deatra Ina  . GI bleed Mar 2012    transfused 4 units PRBCs EGD showed bleed at duodenal bulb.  Plavix was D/C at this time.   . SBO (small bowel obstruction) 2013  . Peripheral edema   . On home O2   . Confusion   . Malnutrition   . Pneumonia     "I've had it twice" (10/18/2014)  . Pleural effusion on right 10/18/2014    Past Surgical History  Procedure Laterality Date  . Cardiac catheterization    . Abdominal hysterectomy    . Laparotomy  10/29/2011    Procedure: EXPLORATORY LAPAROTOMY;  Surgeon: Merrie Roof, MD;  Location: [place];  Service: General;  Laterality: N/A;  EXPLORATORY LAPAROTOMY, LYSIS OF ADHESIONS, PERITONEAL WASHINGS.  . Hip pinning,cannulated Left 01/15/2013    Procedure: CANNULATED HIP PINNING;  Surgeon: Johnny Bridge, MD;  Location: [place];  Service: Orthopedics;  Laterality: Left;  Marland Kitchen Mastectomy Bilateral     Burtis Junes RD, LDN Nutrition Pager: [phone] 10/19/2014 8:52 AM

## 2014-10-19 NOTE — Clinical Social Work Psychosocial (Signed)
     Clinical Social Work Department BRIEF PSYCHOSOCIAL ASSESSMENT 10/19/2014  Patient:  [name], [name]     Account Number:  [id number]     [place] date:  10/18/2014  Clinical Social Worker:  Elam Dutch  Date/Time:  10/19/2014 12:53 PM  Referred by:  Physician  Date Referred:  10/18/2014 Referred for  SNF Placement   Other Referral:   Interview type:  Family Other interview type:   Husband- Philis Kendall  Son:  Shanon Brow    PSYCHOSOCIAL DATA Living Status:  HUSBAND Admitted from facility:   Level of care:   Primary support name:  Philis Kendall  [phone] Primary support relationship to patient:  SPOUSE Degree of support available:   Strong support from husband and son ( only living child)    CURRENT CONCERNS Current Concerns  Post-Acute Placement   Other Concerns:   MD recommends SNF- husband /son refuses    SOCIAL WORK ASSESSMENT / PLAN 81 year old female admitted from home where she lives with her husband Clair Gulling.  Patiient referred to CSW for short term SNF placement.  CSW met with husband and son Shanon Brow to discuss.  Patiient was lying in bed- she is oriented to person only at this time.  Patient has been a resident of [facility] 2 times in the past- the most recent in 2014.  Her husband, who is hard of hearing- stated that both stays were not good experiences as patient would get up without help and could be very easily angered and belligerent with the staff. She has always been extremely independent and strong willed per her husband. He stated that the facility did their best but she was not manageable there.  He does not wish to place her again and prefers to take her [facility] with [facility]. she has a walker and BSC at home. He would like a hospital bed at d/c as well.  Patient's son does not live with them but states he helps out all the time and is very supportive.  Husband feels that he will have better peace of mind if she is at home.  CSW  discussed benefits of receiving PT- even if at home with home health. Both husband and son disagree wiht this and feel that with her age and medical conditions- asking her to do PT is "not a priority to her".  Discussed benefits of movement, exercises and use of her muscles to increase strength and muscle tone- however, husband remains adamant that they do not want PT at home. He will agree to have her receive it in the hospital. Thus- at d/c- he asks for [facility] RN and a hospital bed. Will notify RNCM.   Assessment/plan status:  Information/Referral to [employer] Other assessment/ plan:   Information/referral to community resources:   Discussed home health services. SNF care and DME  Will refer to [facility]    PATIENTS/FAMILYS RESPONSE TO PLAN OF CARE: Patient is alert but disoriented to place,situation and time.  She is fiercely independent in the past and is often impulsive when she wants to get up.  Husband is very supportive and plans to take her home with home health services. Patient has been very upset when she had to go to the SNF the past 2 times.  CSW will sign off.  Lorie Phenix. [place], [place]  (weekend coverage)

## 2014-10-19 NOTE — Progress Notes (Signed)
81yo female w/ CXR showing pleural effusion, concern for CAP, to begin IV ABX.  Will start Rocephin 1g IV Q24H and monitor CBC and Cx.  Wynona Neat, PharmD, BCPS 10/19/2014 10:48 PM

## 2014-10-20 DIAGNOSIS — Z9861 Coronary angioplasty status: Secondary | ICD-10-CM

## 2014-10-20 DIAGNOSIS — N390 Urinary tract infection, site not specified: Secondary | ICD-10-CM

## 2014-10-20 DIAGNOSIS — Z9013 Acquired absence of bilateral breasts and nipples: Secondary | ICD-10-CM

## 2014-10-20 DIAGNOSIS — K7469 Other cirrhosis of liver: Principal | ICD-10-CM

## 2014-10-20 DIAGNOSIS — I10 Essential (primary) hypertension: Secondary | ICD-10-CM

## 2014-10-20 DIAGNOSIS — I4891 Unspecified atrial fibrillation: Secondary | ICD-10-CM

## 2014-10-20 DIAGNOSIS — Z853 Personal history of malignant neoplasm of breast: Secondary | ICD-10-CM

## 2014-10-20 DIAGNOSIS — I2510 Atherosclerotic heart disease of native coronary artery without angina pectoris: Secondary | ICD-10-CM

## 2014-10-20 DIAGNOSIS — B9689 Other specified bacterial agents as the cause of diseases classified elsewhere: Secondary | ICD-10-CM

## 2014-10-20 DIAGNOSIS — D649 Anemia, unspecified: Secondary | ICD-10-CM

## 2014-10-20 DIAGNOSIS — Z8673 Personal history of transient ischemic attack (TIA), and cerebral infarction without residual deficits: Secondary | ICD-10-CM

## 2014-10-20 DIAGNOSIS — J90 Pleural effusion, not elsewhere classified: Secondary | ICD-10-CM

## 2014-10-20 LAB — COMPREHENSIVE METABOLIC PANEL
ALK PHOS: 50 U/L (ref 39–117)
ALT: 9 U/L (ref 0–35)
AST: 14 U/L (ref 0–37)
Albumin: 1.9 g/dL — ABNORMAL LOW (ref 3.5–5.2)
Anion gap: 5 (ref 5–15)
BUN: 16 mg/dL (ref 6–23)
CO2: 34 mmol/L — ABNORMAL HIGH (ref 19–32)
CREATININE: 0.82 mg/dL (ref 0.50–1.10)
Calcium: 8 mg/dL — ABNORMAL LOW (ref 8.4–10.5)
Chloride: 97 mmol/L (ref 96–112)
GFR calc Af Amer: 75 mL/min — ABNORMAL LOW (ref >90)
GFR calc non Af Amer: 64 mL/min — ABNORMAL LOW (ref >90)
Glucose, Bld: 89 mg/dL (ref 70–99)
Potassium: 4.1 mmol/L (ref 3.5–5.1)
Sodium: 136 mmol/L (ref 135–145)
Total Bilirubin: 0.7 mg/dL (ref 0.3–1.2)
Total Protein: 5.2 g/dL — ABNORMAL LOW (ref 6.0–8.3)

## 2014-10-20 LAB — PH, BODY FLUID: pH, Fluid: 8

## 2014-10-20 LAB — STREP PNEUMONIAE URINARY ANTIGEN: Strep Pneumo Urinary Antigen: NEGATIVE

## 2014-10-20 LAB — LACTATE DEHYDROGENASE: LDH: 95 U/L (ref 94–250)

## 2014-10-20 NOTE — Progress Notes (Signed)
Subjective:  Pt still moaning this AM.  States her breathing is worse?  Denies CP.    Objective: Vital signs in last 24 hours: Filed Vitals:   10/19/14 1423 10/19/14 2024 10/19/14 2130 10/20/14 0532  BP: 109/53 106/47  127/46  Pulse: 73 73 78 82  Temp: 97.6 F (36.4 C) 98.1 F (36.7 C)  97.9 F (36.6 C)  TempSrc: Oral Oral  Oral  Resp: 20 21 20 20   Height:      Weight:    43.4 kg (95 lb 10.9 oz)  SpO2: 100% 100%  93%   Weight change: -0.145 kg (-5.1 oz)  Intake/Output Summary (Last 24 hours) at 10/20/14 0747 Last data filed at 10/19/14 1805  Gross per 24 hour  Intake    540 ml  Output      0 ml  Net    540 ml   Gen: Moaning, covered with sheets .lying in the bed, very frail/cachectic  HEENT: Atraumatic, sunken eyes, temporal wasting, EOMI, sclerae anicteric, moist mucous membranes Heart: RRR, no murmurs, rubs, or gallops Lungs: Very poor effort and difficult to hear d/t patient moaning,  decreased sound on R lung base, respirations labored Abd: Soft, non-tender, distended with fluid wave, +BS,no hepatosplenomegaly, no Murphy sign, small ventral hernia that was reducible Ext: no LE edema (heels covered with bandages)   Lab Results: Basic Metabolic Panel:  Recent Labs Lab 10/18/14 2329 10/19/14 0325 10/19/14 2251  NA  --  138 136  K  --  3.8 4.1  CL  --  97 97  CO2  --  33* 34*  GLUCOSE  --  94 89  BUN  --  13 16  CREATININE  --  0.71 0.82  CALCIUM  --  8.0* 8.0*  MG 1.7  --   --   PHOS 3.5  --   --    Liver Function Tests:  Recent Labs Lab 10/18/14 0908 10/19/14 2251  AST 18 14  ALT 10 9  ALKPHOS 55 50  BILITOT 0.9 0.7  PROT 6.4 5.2*  ALBUMIN 2.6* 1.9*    Recent Labs Lab 10/18/14 0908  AMMONIA 36*   CBC:  Recent Labs Lab 10/18/14 0908 10/19/14 0325  WBC 7.2 4.9  NEUTROABS 5.3  --   HGB 10.8* 9.4*  HCT 34.4* 29.6*  MCV 80.6 80.2  PLT 121* 109*   Cardiac Enzymes:  Recent Labs Lab 10/18/14 0908  TROPONINI <0.03   Thyroid  Function Tests:  Recent Labs Lab 10/18/14 1548  TSH 3.533   Coagulation:  Recent Labs Lab 10/18/14 0908  LABPROT 16.3*  INR 1.30   Urinalysis:  Recent Labs Lab 10/18/14 1013  COLORURINE AMBER*  LABSPEC 1.022  PHURINE 6.0  GLUCOSEU NEGATIVE  HGBUR MODERATE*  BILIRUBINUR SMALL*  KETONESUR NEGATIVE  PROTEINUR NEGATIVE  UROBILINOGEN 0.2  NITRITE NEGATIVE  LEUKOCYTESUR MODERATE*   Misc. Labs: Procalcitonin <0.10 BNP 217  Micro Results: Recent Results (from the past 240 hour(s))  Urine culture     Status: None   Collection Time: 10/18/14 10:13 AM  Result Value Ref Range Status   Specimen Description URINE, RANDOM  Final   Special Requests NONE  Final   Colony Count   Final    2,000 COLONIES/ML Performed at [facility]    Culture   Final    INSIGNIFICANT GROWTH Performed at [facility]    Report Status 10/19/2014 FINAL  Final  Body fluid culture     Status: None (  Preliminary result)   Collection Time: 10/19/14  1:27 PM  Result Value Ref Range Status   Specimen Description PLEURAL RIGHT FLUID  Final   Special Requests NONE  Final   Gram Stain   Final    FEW WBC PRESENT, PREDOMINANTLY PMN NO ORGANISMS SEEN Performed at [facility]    Culture PENDING  Incomplete   Report Status PENDING  Incomplete   Studies/Results: Dg Chest 1 View  10/19/2014   CLINICAL DATA:  Status post thoracentesis .  EXAM: CHEST  1 VIEW  COMPARISON:  October 18, 2014.  FINDINGS: Stable cardiomediastinal silhouette. Right pleural effusion is significantly smaller status post thoracentesis. No definite pneumothorax is noted. Minimal left pleural effusion is noted. Degenerative changes seen involving the right glenohumeral joint.  IMPRESSION: Right pleural effusion is significantly smaller status post thoracentesis. No definite pneumothorax is noted.   Electronically Signed   By: Marijo Conception, M.D.   On: 10/19/2014 14:05   Ct Chest W Contrast  10/18/2014    CLINICAL DATA:  Right pleural effusion.  EXAM: CT CHEST WITH CONTRAST  TECHNIQUE: Multidetector CT imaging of the chest was performed during intravenous contrast administration.  CONTRAST:  97m OMNIPAQUE IOHEXOL 300 MG/ML  SOLN  COMPARISON:  Chest radiograph of same day.  FINDINGS: No pneumothorax is noted. Mild left pleural effusion is noted. Large right pleural effusion is noted with atelectasis of the right lower lobe. Subsegmental atelectasis of right upper lobe is noted. There is no evidence of thoracic aortic dissection or aneurysm. Visualized portions of pulmonary arteries appear normal. Small sliding-type hiatal hernia is noted. No mediastinal mass or adenopathy is noted. No significant osseous abnormality is noted.  Severe hepatic cirrhosis is noted. Extensive collateral veins are seen in the splenic hilum consistent with portal hypertension. Moderate to severe ascites is noted as well.  IMPRESSION: Mild left pleural effusion.  Large right pleural effusion is noted with atelectasis of the right lower lobe.  Severe hepatic cirrhosis is noted with evidence of portal hypertension and collateral circulation. Moderate to severe ascites is noted as well.   Electronically Signed   By: JMarijo Conception M.D.   On: 10/18/2014 20:31   Dg Chest Port 1 View  10/18/2014   CLINICAL DATA:  Shortness of breath and congestion. See history of COPD.  EXAM: PORTABLE CHEST - 1 VIEW  COMPARISON:  PA and lateral chest 09/19/2013.  FINDINGS: The patient has a large right pleural effusion and extensive airspace disease. Very small left effusion is noted. The lungs are emphysematous. Heart size is normal.  IMPRESSION: Large right pleural effusion and extensive airspace disease could be due to to pneumonia cannot be definitively characterized. If the abnormality does not clear, short-term follow-up chest CT with contrast is recommended for further evaluation.   Electronically Signed   By: TInge RiseM.D.   On: 10/18/2014  08:55   UKoreaThoracentesis Asp Pleural Space W/img Guide  10/19/2014   INDICATION: Cirrhosis, coronary artery disease, remote history of breast cancer and stroke, dyspnea, right pleural effusion. Request is made for diagnostic and therapeutic right thoracentesis.  EXAM: ULTRASOUND GUIDED DIAGNOSTIC AND THERAPEUTIC RIGHT THORACENTESIS  COMPARISON:  None.  MEDICATIONS: None  COMPLICATIONS: None immediate  TECHNIQUE: Informed written consent was obtained from the patient's family after a discussion of the risks, benefits and alternatives to treatment. A timeout was performed prior to the initiation of the procedure.  Initial ultrasound scanning demonstrates a large right pleural effusion. The lower chest was  prepped and draped in the usual sterile fashion. 1% lidocaine was used for local anesthesia.  An ultrasound image was saved for documentation purposes. A 6 Fr Safe-T-Centesis catheter was introduced. The thoracentesis was performed. The catheter was removed and a dressing was applied. The patient tolerated the procedure well without immediate post procedural complication. The patient was escorted to have an upright chest radiograph.  FINDINGS: A total of approximately 1.5 liters of turbid, yellow fluid was removed. Requested samples were sent to the laboratory. Due to the fact that this was patient's first thoracentesis and noted soft blood pressures only the above amount of fluid was removed at this time.  IMPRESSION: Successful ultrasound-guided diagnostic and therapeutic right sided thoracentesis yielding 1.5 liters of pleural fluid.  Read by: Rowe Robert, PA-C   Electronically Signed   By: Marybelle Killings M.D.   On: 10/19/2014 13:30   Medications: I have reviewed the patient's current medications. Scheduled Meds: . antiseptic oral rinse  7 mL Mouth Rinse BID  . azithromycin  500 mg Intravenous Q24H  . cefTRIAXone (ROCEPHIN)  IV  1 g Intravenous Q24H  . docusate sodium  100 mg Oral BID  . feeding  supplement (ENSURE ENLIVE)  237 mL Oral BID BM  . ferrous sulfate  325 mg Oral Q breakfast  . furosemide  40 mg Oral Daily  . ipratropium-albuterol  3 mL Nebulization TID  . metoprolol succinate  12.5 mg Oral Daily  . pantoprazole  40 mg Oral Daily  . 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  CXR showed large R pleural effusion and extensive airspace disease. Chest CT describes mild L pleural effusion and large R pleural effusion with atelectasis of the R lower lobe. TTE LVEF 55-60%, grade 1 DD, PA peak pressure 36 mmHg so acute CHF exacerbation unlikely. IR performed thoracentesis yesterday and drained 1.5L of turbid yellow fluid and was sent for analysis.  Does not meet Light's criteria so likely transudate making hepatic hydrothorax more likely.  I spoke with the husband who is amenable to speaking with palliative care.   -appreciate IR -cytology pending  -cont azithro/ceftriaxone daily -duoneb q6hprn -daily weights, I&Os -PT/OT -O2 supplementation as needed -cont lasix 23m-->add spironolactone 1050m -will consider consulting palliative medicine  Cryptogenic cirrhosis:  -cont lasix 4050m-added spironolactone 100m19mily   Asymptomatic bacteruria: UA with moderate leukocyte, negative nitrite, few bacteria but few squam. She has no dysuria, polyuria so this is asymptomatic bacteruria. -cont to monitor  CAD s/p PCI and peri-cath CVA: Ms FielBossis/p PCI in 1999 with peri-cath CVA, previous stress test which showed ischemia in the inferior and inferolateral wall. She has refused cardiac catheterization and has been  treated medically though does not want to take statin or ASA. At home she is on toprol-xl 12.5 mg daily, lasix 40 mg daily. She was last seen by Dr Tim Esmond [id number] -cont toprol-xl 12.5 mg daily -resume lasix 40 mg po daily  HTN:  -toprol-xl 12.5 mg daily -resume lasix 40 mg po daily  Atrial fibrillation: No prior h/o afib in our EMR.  CHADS2VASC score is 7 which is 11% stroke risk per year. She does have a  history of GI bleeds. She is rate controlled with metoprolol. Has lovenox 40 mg [place] daily on her med list in EMR but husband says she does not take it. -patient should discuss anti-coagulation with PCP -tropol-xl 12.5 mg daily  Breast cancer s/p bilateral mastectomy: H/o breast ca (specific diagnosis and staging unknown) about 30-40 years ago (per husband who says it was before they met 19 years ago). She elected for bilateral mastectomy and has had no issues since.  Normocytic Anemia: Presenting hemoglobin 10.8 with MCV 81 is now 9.4. Last hemoglobin in our system 8.8 in 01/2013 (hard to tell baseline as very fluctuating). No anemia panel in our record; had a bleed in her duodenal bulb in 2012 requiring 4 u prbcs  -cont to monitor -cont ferrous sulfate 346m qd  DVT PPx: heparin 5000 u [place] tid (hold for thoracentesis)  Code: DNR (confirmed with patient and husband)  Dispo: Disposition is deferred at this time, awaiting improvement of current medical problems.  Anticipated discharge in approximately 1 day(s).    LOS: 2 days   JJones Bales MD 10/20/2014, 7:47 AM

## 2014-10-20 NOTE — Progress Notes (Signed)
Foley inserted per MD orders and per family wishes. Patient tolerated well. RN will continue to monitor. Alfredo Bach RN BSN 10/20/2014 5:59 PM

## 2014-10-20 NOTE — Evaluation (Signed)
Occupational Therapy Evaluation Patient Details Name: [name] MRN: [id number] DOB: Dec 06, 1928 Today's Date: 10/20/2014    History of Present Illness 81 yo frail female with pleural effusion and ascites, PMHx: CVA. Very involved husband   Clinical Impression   This 81 yo female admitted with above presents to acute OT with decreased mobility, decreased balance, decreased cognition all affecting her ability to help care for herself at home. She will benefit from acute OT with follow up [place] (however can not get it due to family declining HHPT and thus cannot have [place]) to work towards doing more for herself. We will continue to follow.    Follow Up Recommendations  Home health OT (however pt and son refusing PT and can't have OT in home without PT)    Equipment Recommendations  None recommended by OT       Precautions / Restrictions Precautions Precautions: Fall Restrictions Weight Bearing Restrictions: No      Mobility Bed Mobility Overal bed mobility: Needs Assistance Bed Mobility: Supine to Sit     Supine to sit: Mod assist     General bed mobility comments: Got her legs over and came up sit, but then needed A to scoot forward to EOB  Transfers Overall transfer level: Needs assistance Equipment used: 1 person hand held assist Transfers: Sit to/from [facility] Sit to Stand: Min assist Stand pivot transfers: Mod assist            Balance Overall balance assessment: Needs assistance Sitting-balance support: No upper extremity supported;Feet supported Sitting balance-Leahy Scale: Fair     Standing balance support: Bilateral upper extremity supported Standing balance-Leahy Scale: Poor                              ADL Overall ADL's : Needs assistance/impaired Eating/Feeding:  (Husband reports that she fed her self her lunch)   Grooming: Wash/dry hands;Wash/dry face;Moderate assistance (for thoroughness)   Upper Body  Bathing: Maximal assistance;Sitting;Bed level (supported or)   Lower Body Bathing: Maximal assistance (with min A sit<>stand)   Upper Body Dressing : Total assistance;Sitting (supported)   Lower Body Dressing: Total assistance (with min A sit<>stand)   Toilet Transfer: Moderate assistance;Stand-pivot (with me in front of her)   Toileting- Clothing Manipulation and Hygiene: Total assistance (with min A sit<>stand)                         Pertinent Vitals/Pain Pain Assessment: No/denies pain     Hand Dominance  Right   Extremity/Trunk Assessment Upper Extremity Assessment Upper Extremity Assessment: Generalized weakness ( can use wash cloth to wash hands and face--but not throughly)           Communication Communication Communication: HOH;Expressive difficulties;Other (comment) (makes the same noise over and over again--moaning but not in pain)   Cognition Arousal/Alertness: Lethargic Behavior During Therapy: Flat affect Overall Cognitive Status:  (H/O confusion)                                Home Living Family/patient expects to be discharged to:: Private residence Living Arrangements: Spouse/significant other Available Help at Discharge: Family;Available 24 hours/day Type of Home: House Home Access: Stairs to enter [employer] of Steps: 2 Entrance Stairs-Rails: Right;Left;Can reach both Home Layout: One level     Bathroom Shower/Tub: Occupational psychologist:  Standard     Home Equipment: Walker - 2 wheels;Shower seat;Bedside commode          Prior Functioning/Environment Level of Independence: Needs assistance  Gait / Transfers Assistance Needed: RW with supervision and min assist on stairs ADL's / Homemaking Assistance Needed: husband cares for house        OT Diagnosis: Generalized weakness;Altered mental status   OT Problem List: Decreased strength;Decreased activity tolerance;Impaired balance (sitting  and/or standing);Decreased cognition;Decreased knowledge of use of DME or AE   OT Treatment/Interventions: Self-care/ADL training;Therapeutic activities;Patient/family education;Balance training    OT Goals(Current goals can be found in the care plan section) Acute Rehab OT Goals OT Goal Formulation: With family Time For Goal Achievement: 10/27/14 Potential to Achieve Goals: Fair  OT Frequency: Min 2X/week              End of Session Equipment Utilized During Treatment: Gait belt;Rolling walker Nurse Communication: Mobility status  Activity Tolerance: Patient limited by lethargy Patient left: in chair;with call bell/phone within reach;with family/visitor present (son and asked son to let staff now when he left to they could put pt back to bed)   Time: 0277-4128 OT Time Calculation (min): 26 min Charges:  OT General Charges $OT Visit: 1 Procedure OT Evaluation $Initial OT Evaluation Tier I: 1 Procedure OT Treatments $Self Care/Home Management : 8-22 mins  Almon Register [phone] 10/20/2014, 4:30 PM

## 2014-10-20 NOTE — Progress Notes (Signed)
Internal Medicine Attending  Date: 10/20/2014  Patient name: [name] Medical record number: [id number] Date of birth: 1929/05/15 Age: 81 y.o. Gender: female  I saw and evaluated the patient this AM and I discussed her care with resident .  I reviewed the resident's note by Dr. Gordy Levan and I agree with the resident's findings and plans as documented in her note, with the following additional comments.  At the time I saw patient, she was eating lunch, appeared comfortable, and reported that her breathing was better.  Would repeat chest x-ray tomorrow and then consider whether repeat thoracentesis would be helpful.  The issue of anticoagulation given her new diagnosis of atrial fibrillation should be discussed further with patient and family; the risk of anticoagulation would have to be carefully considered, given her frailty and her history of GI bleed.  Dr. Dareen Piano will return tomorrow as attending physician 10/21/2014.

## 2014-10-20 NOTE — Progress Notes (Signed)
Paged by the RN regarding the patient's son requesting that Ms. Rozar have a foley catheter placed.  I explained my concerns regarding infection and possible trauma with placement as well as if the patient tries to pull it out.  He is insistent upon having a foley placed even after I explained my concerns and my intention to do what is best for his mother.  He states "I know what should be done" and she has had a foley at other hospitals.  I will place an order for a foley catheter per the son's wishes.

## 2014-10-21 ENCOUNTER — Inpatient Hospital Stay (HOSPITAL_COMMUNITY): Payer: Medicare Other

## 2014-10-21 DIAGNOSIS — D509 Iron deficiency anemia, unspecified: Secondary | ICD-10-CM

## 2014-10-21 LAB — CBC
HEMATOCRIT: 28.5 % — AB (ref 36.0–46.0)
Hemoglobin: 9.2 g/dL — ABNORMAL LOW (ref 12.0–15.0)
MCH: 25.3 pg — ABNORMAL LOW (ref 26.0–34.0)
MCHC: 32.3 g/dL (ref 30.0–36.0)
MCV: 78.5 fL (ref 78.0–100.0)
PLATELETS: 128 10*3/uL — AB (ref 150–400)
RBC: 3.63 MIL/uL — AB (ref 3.87–5.11)
RDW: 16.8 % — ABNORMAL HIGH (ref 11.5–15.5)
WBC: 5 10*3/uL (ref 4.0–10.5)

## 2014-10-21 LAB — LEGIONELLA ANTIGEN, URINE

## 2014-10-21 LAB — BASIC METABOLIC PANEL
ANION GAP: 5 (ref 5–15)
BUN: 16 mg/dL (ref 6–23)
CO2: 34 mmol/L — ABNORMAL HIGH (ref 19–32)
Calcium: 8 mg/dL — ABNORMAL LOW (ref 8.4–10.5)
Chloride: 95 mmol/L — ABNORMAL LOW (ref 96–112)
Creatinine, Ser: 0.92 mg/dL (ref 0.50–1.10)
GFR calc Af Amer: 65 mL/min — ABNORMAL LOW (ref >90)
GFR, EST NON AFRICAN AMERICAN: 56 mL/min — AB (ref >90)
GLUCOSE: 98 mg/dL (ref 70–99)
POTASSIUM: 4.1 mmol/L (ref 3.5–5.1)
SODIUM: 134 mmol/L — AB (ref 135–145)

## 2014-10-21 MED ORDER — HEPARIN SODIUM (PORCINE) 5000 UNIT/ML IJ SOLN: 5000.0000 [IU] | Freq: Three times a day (TID) | INTRAMUSCULAR | Status: DC

## 2014-10-21 MED ORDER — HEPARIN SODIUM (PORCINE) 1000 UNIT/ML IJ SOLN: 5000.0000 [IU] | Freq: Three times a day (TID) | INTRAMUSCULAR | Status: DC

## 2014-10-21 MED ORDER — SPIRONOLACTONE 100 MG PO TABS: 100.0000 mg | ORAL_TABLET | Freq: Every day | ORAL | Status: AC

## 2014-10-21 NOTE — Progress Notes (Signed)
Chaplain responded to spiritual care consult for advanced directive. Pt confused at times by questions in the advanced directive. Pt reports that she would prefer to do this at a later time. Accd to pt husband and pt living will is completed and on file from years earlier. Chaplain attempted to ease tension with husband through clarification. Pt husband does not understand purpose of document, with exception of Power of [place], when there is MOST form, DNR, and existing living will. Chaplain unable to complete for with pt at this time due to confusion.    10/21/14 1400  Clinical Encounter Type  Visited With Patient and family together  Visit Type Initial;Spiritual support  Referral From Palliative care team  Spiritual Encounters  Spiritual Needs Emotional  Stress Factors  Patient Stress Factors Health changes  Family Stress Factors Health changes  [name], Barbette Hair, Chaplain 10/21/2014 2:08 PM

## 2014-10-21 NOTE — Progress Notes (Signed)
Speech Language Pathology Treatment: Dysphagia  Patient Details Name: [name] MRN: [id number] DOB: 11-22-1928 Today's Date: 10/21/2014 Time: 4210-3128 SLP Time Calculation (min) (ACUTE ONLY): 8 min  Assessment / Plan / Recommendation Clinical Impression  F/u after Saturday's swallow evaluation.  Pt with limited appetite, early satiety, but is consuming soft foods and thin liquids with sufficient effort, adequate mastication, no overt s/s of aspiration; mod I for safety.  For D/C either today or tomorrow - recommend continuing soft diet at home.  No SLP f/u is indicated - will sign off.    HPI HPI: Patient is an 81 year old woman with history of cryptogenic cirrhosis, coronary artery disease, hypertension, CVA, and other problems as outlined in the medical history admitted with a one-week history of cough, with increased shortness of breath over the 3 days prior to admission. Husband also reports that she has been more weak, with decreased appetite.   Pertinent Vitals Pain Assessment: No/denies pain  SLP Plan  All goals met    Recommendations Diet recommendations: Dysphagia 2 (fine chop);Thin liquid Medication Administration: Whole meds with puree Supervision: Patient able to self feed Compensations: Slow rate;Small sips/bites;Check for pocketing              Follow up Recommendations: None Plan: All goals met  Aveena Bari L. Tivis Ringer, [place] CCC/SLP Pager [phone]       Juan Quam Laurice 10/21/2014, 2:30 PM

## 2014-10-21 NOTE — Discharge Instructions (Signed)
It was a pleasure to care for you at [facility]. We have started a new medication called spironolactone to be taken daily for your liver. Please attend your follow-up appointment with Dr Rex Kras on Wednesday 4/13 at 2 pm . Please return to the Emergency Department or seek medical attention if you have any new or worsening trouble breathing, fever, or other worrisome medical condition.   Lottie Mussel, MD

## 2014-10-21 NOTE — Consult Note (Signed)
Palliative Medicine Team at [facility]  Date: 10/21/2014   Patient Name: [name]  DOB: 24-Oct-1928  MRN: [id number]  Age / Sex: 81 y.o., female   PCP: Tamsen Roers, MD Referring Physician: Aldine Contes, MD  Active Problems: Principal Problem:   Pleural effusion secondary to cryptogenic cirrhosis Active Problems:   CAD (coronary artery disease)   Cirrhosis of liver   Protein-calorie malnutrition, severe   Cirrhosis, cryptogenic   HPI/Reason for Consultation: [name] is a 81 y.o. female admitted with increasing shortness of breath and weakness; new on-set a. Fib, pleural effusion thought to be hydrothorax secondary to cryptogenic cirrhosis following IR guided thoracentesis. Patient has been living at home. Per report she is very independent and SO and son favor return to home, declining SNF, for on-going care. Consult is requested to establish goals of care.   Per patient and confirmed by her husband she has lost weight over the past 2 years since her GI bleed and since being diagnosed with cirrhosis. She does eat at home. She can ambulate using a walker for short distances. She does require assistance with bathing but does dress herself.   Participants in Discussion: HCPOA: no formal HCPOA in place. This was discussed and HCPOA form will be provided.   Advance Directive:    Code Status Orders        Start     Ordered   10/18/14 1805  Do not attempt resuscitation (DNR)   Continuous    Question Answer Comment  In the event of cardiac or respiratory ARREST Do not call a "code blue"   In the event of cardiac or respiratory ARREST Do not perform Intubation, CPR, defibrillation or ACLS   In the event of cardiac or respiratory ARREST Use medication by any route, position, wound care, and other measures to relive pain and suffering. May use oxygen, suction and manual treatment of airway obstruction as needed for comfort.      10/18/14 1804    Advance Directive  Documentation        Most Recent Value   Type of Advance Directive  Living will [dated 12/25/1991]   Pre-existing out of facility DNR order (yellow form or pink MOST form)  Provided Yellow DNR form signed today 10/21/14   "MOST" Form in Place?  Discussed elements of MOST and completed and signed form today 10/21/14         I have reviewed the medical record, interviewed the patient and family, and examined the patient. The following aspects are pertinent.  Past Medical History  Diagnosis Date  . Coronary artery disease     s/p PCI in 1999 with peri-cath CVA, previous stress test  which showed ischemia in the inferior and inferolateral wall--> refused cardiac catheterization and has been treated medically though does not want to take statin or ASA  . CVA (cerebral vascular accident)   . GERD (gastroesophageal reflux disease)   . Hypertension   . Femoral neck fracture 12/2008    right  . Asthma   . Hyperlipidemia   . Breast cancer   . Cryptogenic cirrhosis     Dr. Deatra Ina  . GI bleed Mar 2012    transfused 4 units PRBCs EGD showed bleed at duodenal bulb.  Plavix was D/C at this time.   . SBO (small bowel obstruction) 2013  . Peripheral edema   . On home O2   . Confusion   . Malnutrition   . Pneumonia     "I've  had it twice" (10/18/2014)  . Pleural effusion on right 10/18/2014   History   Social History  . Marital Status: Married    Spouse Name: Clair Gulling  . Number of Children: 2: 1 daughter deceased - leukemia; 1 son  . Years of Education: 8th   Social History Main Topics  . Smoking status: Never Smoker   . Smokeless tobacco: Never Used  . Alcohol Use: No  . Drug Use: No  . Sexual Activity: No   Other Topics Concern  . 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 She raised two children: lost a daughter to leukemia several  years ago; has a living son. She has 2 grandsons, 3 grand-daughters. She was widowed but remarried in 2001. She and her husband live alone in a trailer. He is her primary caretaker and reports that he manages her needs. She uses a walker in the home and when out. He will often use a w/c for convenience when they go out to eat. She is a strong willed person.  Family History  Problem Relation Age of Onset  . Heart attack Son     x 2   Scheduled Meds: . antiseptic oral rinse  7 mL Mouth Rinse BID  . docusate sodium  100 mg Oral BID  . feeding supplement (ENSURE ENLIVE)  237 mL Oral BID BM  . ferrous sulfate  325 mg Oral Q breakfast  . furosemide  40 mg Oral Daily  . heparin subcutaneous  5,000 Units Subcutaneous 3 times per day  . ipratropium-albuterol  3 mL Nebulization TID  . metoprolol succinate  12.5 mg Oral Daily  . pantoprazole  40 mg Oral Daily  . spironolactone  100 mg Oral Daily   Continuous Infusions:  PRN Meds:.albuterol, bisacodyl, diazepam, montelukast, polyethylene glycol Allergies  Allergen Reactions  . Codeine     REACTION: Any derevative ----  Becomes unresponsive   CBC:    Component Value Date/Time   WBC 5.0 10/21/2014 0400   HGB 9.2* 10/21/2014 0400   HCT 28.5* 10/21/2014 0400   PLT 128* 10/21/2014 0400   MCV 78.5 10/21/2014 0400   NEUTROABS 5.3 10/18/2014 0908   LYMPHSABS 0.8 10/18/2014 0908   MONOABS 0.6 10/18/2014 0908   EOSABS 0.3 10/18/2014 0908   BASOSABS 0.1 10/18/2014 0908   Comprehensive Metabolic Panel:    Component Value Date/Time   NA 134* 10/21/2014 0400   K 4.1 10/21/2014 0400   CL 95* 10/21/2014 0400   CO2 34* 10/21/2014 0400   BUN 16 10/21/2014 0400   CREATININE 0.92 10/21/2014 0400   GLUCOSE 98 10/21/2014 0400   CALCIUM 8.0* 10/21/2014 0400   AST 14 10/19/2014 2251   ALT 9 10/19/2014 2251   ALKPHOS 50 10/19/2014 2251   BILITOT 0.7 10/19/2014 2251   PROT 5.2* 10/19/2014 2251   ALBUMIN 1.9* 10/19/2014 2251    Vital Signs: BP  120/42 mmHg  Pulse 89  Temp(Src) 97.8 F (36.6 C) (Oral)  Resp 19  Ht 5' (1.524 m)  Wt 42.7 kg (94 lb 2.2 oz)  BMI 18.38 kg/m2  SpO2 96% Filed Weights   10/19/14 0433 10/20/14 0532 10/21/14 0453  Weight: 43.7 kg (96 lb 5.5 oz) 43.4 kg (95 lb 10.9 oz) 42.7 kg (94 lb 2.2 oz)   04/10 0701 - 04/11 0700 In: 683 [P.O.:796] Out:  550 [Urine:550]  Physical Exam:  General Appearance: Alert, oriented, no acute distress and cachectic HEENT: Noticeable temporal wasting, ectropic eyelids OD, C&S clear, PERRLA, edentulous with loose fitting dentures Lungs: decreased breath sounds CV: regular rate on exam and telemetry Abdomen:deferred Extremities:very thin with interosseous wasting. NO frank deformities Skin:Skin color, texture, turgor normal. No rashes or lesions or Poor skin turgor. Very thin skin. Dark keratotic appearing mole right temple. Psych: oriented to time, place and person  Neuro: grossly intact  But hindered by hearing loss.  Summary of Established Goals of Care and Medical Treatment Preferences  Primary Diagnoses  1.  Pleural effusion, most likely transudate related to cirrhosis. No evidence of heart failure Active Symptoms: 1. Stable w/o Active respiratory distress; denies any pain  Psycho-social/Spiritual: appears stable.  Prognosis: guarded given underlying medical issues: 02 dependent asthmatic/COPD, CAD, malnutrition, cirrhosis. 1. Pleural effusion - s/p thoracentesis. For f/u CXR today.  2. A. Fib - currently in sinus rhythm. Husband reports she was previously on  Anti-coag treatment but doesn't know why. Suspect she may have had intermittent A. Fib.. Followed by Dr. Rockey Situ for cardiology in [place]. 3. Cryptogenic cirrhosis w/o acute flare and with stable LFTs.  4. Malnutrition - very thin and has been a poor eater at home. SLP eval done - will need chopped meats, soft textured foods and supervision with meals. 5. Advanced deconditioning - patient and family do not  want HHPT or [place]. She does need HH nursing and aide services   Palliative Performance Scale: 30%  Recommendations:  1. Code Status: DNR 2. Scope of Treatment:  DNR, no ICU care, no intubation or mechanical ventilation; antibiotics on case by case basis; no tube feeding; trial period of IV fluids for short-term problems. MOST completed accordingly 3. Symptom Management: No active issues re: pain, GI, respiratory 4. Palliative Prophylaxis: per husband she has a normal bowel habit at home 5. Disposition: D/C home with HH.  Will provide HCPOA form and ask Chaplains office to notarize.   Time In: 11:30 Time Out: 13:00 Time Total: 90 minutes Greater than 50%  of this time was spent counseling and coordinating care related to the above assessment and plan.  Signed by: Adella Hare, MD  Neena Rhymes, MD  10/21/2014, 11:36 AM  Please contact Palliative Medicine Team phone at [phone] for questions and concerns.

## 2014-10-21 NOTE — Progress Notes (Signed)
Subjective:  [name] and husband were in much better spirits this morning. She says her SOB is improved and she feels better but still some SOB. Husband says she seems back to her baseline and was even joking with him this morning. Her husband insists that she does not want to go to SNF and they would prefer home health services and also ask for a hospital bed.   Objective: Vital signs in last 24 hours: Filed Vitals:   10/20/14 2028 10/20/14 2138 10/21/14 0453 10/21/14 0858  BP:  102/43 96/41   Pulse: 88 97 91   Temp:  98.3 F (36.8 C) 97.8 F (36.6 C)   TempSrc:  Oral Oral   Resp: _0 Height:      Weight:   94 lb 2.2 oz (42.7 kg)   SpO2:  93% 93% 96%   Weight change: -1 lb 8.7 oz (-0.7 kg)  Intake/Output Summary (Last 24 hours) at 10/21/14 1013 Last data filed at 10/21/14 0457  Gross per 24 hour  Intake    576 ml  Output    550 ml  Net     26 ml   Gen: Sitting up in bed eating, pleasant mood, very frail/cachectic  HEENT: Atraumatic, sunken eyes, temporal wasting, EOMI, sclerae anicteric, moist mucous membranes Heart: RRR, no murmurs, rubs, or gallops Lungs: Decreased sound on R lung base, respirations unlabored Abd: Soft, non-tender, distended with fluid wave, +BS,no hepatosplenomegaly, no Murphy sign, small ventral hernia that was reducible, baseline tremor but no asterixis Ext: no LE edema (heels covered with bandages)   Lab Results: Basic Metabolic Panel:  Recent Labs Lab 10/18/14 2329  10/19/14 2251 10/21/14 0400  NA  --   < > 136 134*  K  --   < > 4.1 4.1  CL  --   < > 97 95*  CO2  --   < > 34* 34*  GLUCOSE  --   < > 89 98  BUN  --   < > 16 16  CREATININE  --   < > 0.82 0.92  CALCIUM  --   < > 8.0* 8.0*  MG 1.7  --   --   --   PHOS 3.5  --   --   --   < > = values in this interval not displayed. Liver Function Tests:  Recent Labs Lab 10/18/14 0908 10/19/14 2251  AST 18 14  ALT 10 9  ALKPHOS 55 50  BILITOT 0.9 0.7  PROT 6.4 5.2*    ALBUMIN 2.6* 1.9*    Recent Labs Lab 10/18/14 0908  AMMONIA 36*   CBC:  Recent Labs Lab 10/18/14 0908 10/19/14 0325 10/21/14 0400  WBC 7.2 4.9 5.0  NEUTROABS 5.3  --   --   HGB 10.8* 9.4* 9.2*  HCT 34.4* 29.6* 28.5*  MCV 80.6 80.2 78.5  PLT 121* 109* 128*   Cardiac Enzymes:  Recent Labs Lab 10/18/14 0908  TROPONINI <0.03   Thyroid Function Tests:  Recent Labs Lab 10/18/14 1548  TSH 3.533   Coagulation:  Recent Labs Lab 10/18/14 0908  LABPROT 16.3*  INR 1.30   Urinalysis:  Recent Labs Lab 10/18/14 1013  COLORURINE AMBER*  LABSPEC 1.022  PHURINE 6.0  GLUCOSEU NEGATIVE  HGBUR MODERATE*  BILIRUBINUR SMALL*  KETONESUR NEGATIVE  PROTEINUR NEGATIVE  UROBILINOGEN 0.2  NITRITE NEGATIVE  LEUKOCYTESUR MODERATE*   Misc. Labs: Procalcitonin <0.10 BNP 217 Strep pneumo negative LDH 95 Pleural fluid:  yellow, cloudy, 81 WBC, 1% neutrophil, 36 lymph%, 68% monocyte, few mesothelial cells, <3 protein, glucose 120, LDH 31, amylase 9, pH 8.0    Micro Results: Recent Results (from the past 240 hour(s))  Urine culture     Status: None   Collection Time: 10/18/14 10:13 AM  Result Value Ref Range Status   Specimen Description URINE, RANDOM  Final   Special Requests NONE  Final   Colony Count   Final    2,000 COLONIES/ML Performed at [facility]    Culture   Final    INSIGNIFICANT GROWTH Performed at [facility]    Report Status 10/19/2014 FINAL  Final  Body fluid culture     Status: None (Preliminary result)   Collection Time: 10/19/14  1:27 PM  Result Value Ref Range Status   Specimen Description PLEURAL RIGHT FLUID  Final   Special Requests NONE  Final   Gram Stain   Final    FEW WBC PRESENT, PREDOMINANTLY PMN NO ORGANISMS SEEN Performed at [facility]    Culture NO GROWTH Performed at [facility]   Final   Report Status PENDING  Incomplete   Studies/Results: Dg Chest 1 View  10/19/2014   CLINICAL  DATA:  Status post thoracentesis .  EXAM: CHEST  1 VIEW  COMPARISON:  October 18, 2014.  FINDINGS: Stable cardiomediastinal silhouette. Right pleural effusion is significantly smaller status post thoracentesis. No definite pneumothorax is noted. Minimal left pleural effusion is noted. Degenerative changes seen involving the right glenohumeral joint.  IMPRESSION: Right pleural effusion is significantly smaller status post thoracentesis. No definite pneumothorax is noted.   Electronically Signed   By: Marijo Conception, M.D.   On: 10/19/2014 14:05   Dg Chest 2 View  10/21/2014   CLINICAL DATA:  Pleural effusion and status post right thoracentesis on 10/19/2014  EXAM: CHEST - 2 VIEW  COMPARISON:  10/19/2014  FINDINGS: The heart size and mediastinal contours are within normal limits. A moderate right pleural effusion remains. There is no evidence of pulmonary edema, consolidation, pneumothorax or mass. The visualized skeletal structures are unremarkable.  IMPRESSION: Residual moderate right pleural effusion.   Electronically Signed   By: Aletta Edouard M.D.   On: 10/21/2014 08:23   US Thoracentesis Asp Pleural Space W/img Guide  10/19/2014   INDICATION: Cirrhosis, coronary artery disease, remote history of breast cancer and stroke, dyspnea, right pleural effusion. Request is made for diagnostic and therapeutic right thoracentesis.  EXAM: ULTRASOUND GUIDED DIAGNOSTIC AND THERAPEUTIC RIGHT THORACENTESIS  COMPARISON:  None.  MEDICATIONS: None  COMPLICATIONS: None immediate  TECHNIQUE: Informed written consent was obtained from the patient's family after a discussion of the risks, benefits and alternatives to treatment. A timeout was performed prior to the initiation of the procedure.  Initial ultrasound scanning demonstrates a large right pleural effusion. The lower chest was prepped and draped in the usual sterile fashion. 1% lidocaine was used for local anesthesia.  An ultrasound image was saved for documentation  purposes. A 6 Fr Safe-T-Centesis catheter was introduced. The thoracentesis was performed. The catheter was removed and a dressing was applied. The patient tolerated the procedure well without immediate post procedural complication. The patient was escorted to have an upright chest radiograph.  FINDINGS: A total of approximately 1.5 liters of turbid, yellow fluid was removed. Requested samples were sent to the laboratory. Due to the fact that this was patient's first thoracentesis and noted soft blood pressures only the above amount of  fluid was removed at this time.  IMPRESSION: Successful ultrasound-guided diagnostic and therapeutic right sided thoracentesis yielding 1.5 liters of pleural fluid.  Read by: Rowe Robert, PA-C   Electronically Signed   By: Marybelle Killings M.D.   On: 10/19/2014 13:30   Medications: I have reviewed the patient's current medications. Scheduled Meds: . antiseptic oral rinse  7 mL Mouth Rinse BID  . docusate sodium  100 mg Oral BID  . feeding supplement (ENSURE ENLIVE)  237 mL Oral BID BM  . ferrous sulfate  325 mg Oral Q breakfast  . furosemide  40 mg Oral Daily  . heparin subcutaneous  5,000 Units Subcutaneous 3 times per day  . ipratropium-albuterol  3 mL Nebulization TID  . metoprolol succinate  12.5 mg Oral Daily  . pantoprazole  40 mg Oral Daily  . 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  CXR showed large R pleural effusion and extensive airspace disease. Chest CT describes mild L pleural effusion and large R pleural effusion with atelectasis of the R lower lobe. TTE LVEF  55-60%, grade 1 DD, PA peak pressure 36 mmHg so acute CHF exacerbation unlikely. IR performed thoracentesis 4/9 and drained 1.5L of turbid yellow fluid and was sent for analysis.  Does not meet Light's criteria so likely transudate making hepatic hydrothorax more likely. Added spironolactone 100 mg po daily. Will stop antibiotics (3 days of azithro/ceftriaxone) as CAP unlikely without leukocytosis or fever, and repeat CXR today only notes residual R pleural effusion without other consolidation. Patient and husband agree to palliative consult -appreciate IR -f/u palliative consult -cytology pending  -d/c azithro/ceftriaxone daily -cont lasix 58m, spironolactone 1072m -duoneb q6hprn -daily weights, I&Os -PT/OT -O2 supplementation as needed  Cryptogenic cirrhosis:  -cont lasix 4015mspironolactone 100m43mily  -palliative consult  Asymptomatic bacteruria: UA with moderate leukocyte, negative nitrite, few bacteria but few squam. She has no dysuria, polyuria so this is asymptomatic bacteruria. -cont to monitor  CAD s/p PCI and peri-cath CVA: Ms [place]/p PCI in 1999 with peri-cath CVA, previous stress test which showed ischemia in the inferior and inferolateral wall. She has refused cardiac catheterization and has been treated medically though does not want to take statin or ASA. At home she is on toprol-xl 12.5 mg daily, lasix 40 mg daily. She was last seen by Dr Tim Esmond Plants014 -cont toprol-xl 12.5 mg daily, lasix 40 mg po daily  HTN: BP soft overnight 90s-100s/40s so on low dose medications for her CHF, cirrhosis -toprol-xl 12.5 mg daily, lasix 40 mg po daily, spironolactone 100 mg daily  Atrial fibrillation: No prior h/o afib in our EMR.  CHADS2VASC score is 7 which is 11% stroke risk per year. She does have a history of GI bleeds. She is rate controlled with metoprolol. Has lovenox 40 mg [place] daily on her med list in EMR but husband says she does not take it. -patient should discuss  anti-coagulation with PCP -cont tropol-xl 12.5 mg daily  Breast cancer s/p bilateral mastectomy: H/o breast ca (specific diagnosis and staging unknown) about 30-40 years ago (per husband who says it was before they met 19 years ago). She elected for  bilateral mastectomy and has had no issues since.  Normocytic Anemia: Presenting hemoglobin 10.8 with MCV 81 is now 9.2. Last hemoglobin in our system 8.8 in 01/2013 (hard to tell baseline as very fluctuating). No anemia panel in our record; had a bleed in her duodenal bulb in 2012 requiring 4 u prbcs  -cont to monitor -cont ferrous sulfate 378m qd  DVT PPx: heparin 5000 u [place] tid  Code: DNR (confirmed with patient and husband)  Dispo: Disposition is deferred at this time, awaiting improvement of current medical problems.  Anticipated discharge in approximately 0-1 day(s).    LOS: 3 days   AKelby Aline MD 10/21/2014, 10:13 AM

## 2014-10-21 NOTE — H&P (Deleted)
Name: [name] MRN: [id number] DOB: 29-May-1929 81 y.o. PCP: Tamsen Roers, MD  Date of Admission: 10/18/2014  8:03 AM Date of Discharge: 10/21/2014 Attending Physician: Aldine Contes, MD  Discharge Diagnosis:  Principal Problem:   Pleural effusion secondary to cryptogenic cirrhosis Active Problems:   CAD (coronary artery disease)   Cirrhosis of liver   Protein-calorie malnutrition, severe   Cirrhosis, cryptogenic  Discharge Medications:   Medication List    STOP taking these medications        enoxaparin 40 MG/0.4ML injection  Commonly known as:  LOVENOX      TAKE these medications        bisacodyl 10 MG suppository  Commonly known as:  DULCOLAX  Place 1 suppository (10 mg total) rectally daily as needed.     diazepam 5 MG tablet  Commonly known as:  VALIUM  Take 5 mg by mouth as needed.     DSS 100 MG Caps  Take 100 mg by mouth 2 (two) times daily.     ferrous sulfate 325 (65 FE) MG tablet  Take 1 tablet (325 mg total) by mouth 3 (three) times daily with meals.     furosemide 40 MG tablet  Commonly known as:  LASIX  Take 40 mg by mouth daily.     HYDROcodone-acetaminophen 5-325 MG per tablet  Commonly known as:  NORCO  Take 1-2 tablets by mouth every 6 (six) hours as needed for pain. MAXIMUM TOTAL ACETAMINOPHEN DOSE IS 4000 MG PER DAY     metoprolol succinate 12.5 mg Tb24 24 hr tablet  Commonly known as:  TOPROL-XL  Take 0.5 tablets (12.5 mg total) by mouth daily.     montelukast 10 MG tablet  Commonly known as:  SINGULAIR  Take 10 mg by mouth daily as needed (allergies).     nitroGLYCERIN 0.4 MG SL tablet  Commonly known as:  NITROSTAT  Place 0.4 mg under the tongue every 5 (five) minutes as needed. Chest pain     omeprazole 20 MG capsule  Commonly known as:  PRILOSEC  Take 20 mg by mouth daily.     polyethylene glycol packet  Commonly known as:  MIRALAX / GLYCOLAX  Take 17 g by mouth daily as needed.     sennosides-docusate sodium  8.6-50 MG tablet  Commonly known as:  SENOKOT-S  Take 2 tablets by mouth daily.     spironolactone 100 MG tablet  Commonly known as:  ALDACTONE  Take 1 tablet (100 mg total) by mouth daily.     VENTOLIN HFA 108 (90 BASE) MCG/ACT inhaler  Generic drug:  albuterol  Inhale 2 puffs into the lungs every 4 (four) hours as needed for wheezing.        Disposition and follow-up:   [name] was discharged from [facility] in [place] condition.  At the hospital follow up visit please address:  1.  Breathing status, fluid status, consider atrial fibrillation anti-coagulation  2.  Labs / imaging needed at time of follow-up: none  3.  Pending labs/ test needing follow-up: pleural fluid cytology  Follow-up Appointments: Follow-up Information    Follow up with Tamsen Roers, MD On 10/23/2014.   Specialty:  Family Medicine   Why:  @ 2 pm   Contact information:   [address] [place] [address] [phone]       Follow up with [facility].   Why:  HH-RN/PT/OT ordered (husband only wants Therapist, sports)  Contact information:   [address] [place] [address] [phone]       Follow up with [facility].   Why:  Hospital bed arranged   Contact information:   [address] [place] [address] [phone]       Discharge Instructions: Discharge Instructions    Diet - low sodium heart healthy    Complete by:  As directed      Increase activity slowly    Complete by:  As directed            Consultations: Treatment Team:  Palliative [facility]  Procedures Performed:  Dg Chest 1 View  10/19/2014   CLINICAL DATA:  Status post thoracentesis .  EXAM: CHEST  1 VIEW  COMPARISON:  October 18, 2014.  FINDINGS: Stable cardiomediastinal silhouette. Right pleural effusion is significantly smaller status post thoracentesis. No definite pneumothorax is noted. Minimal left pleural effusion is noted. Degenerative changes  seen involving the right glenohumeral joint.  IMPRESSION: Right pleural effusion is significantly smaller status post thoracentesis. No definite pneumothorax is noted.   Electronically Signed   By: James  Green Jr, M.D.   On: 10/19/2014 14:05   Dg Chest 2 View  10/21/2014   CLINICAL DATA:  Pleural effusion and status post right thoracentesis on 10/19/2014  EXAM: CHEST - 2 VIEW  COMPARISON:  10/19/2014  FINDINGS: The heart size and mediastinal contours are within normal limits. A moderate right pleural effusion remains. There is no evidence of pulmonary edema, consolidation, pneumothorax or mass. The visualized skeletal structures are unremarkable.  IMPRESSION: Residual moderate right pleural effusion.   Electronically Signed   By: Glenn  Yamagata M.D.   On: 10/21/2014 08:23   Ct Chest W Contrast  10/18/2014   CLINICAL DATA:  Right pleural effusion.  EXAM: CT CHEST WITH CONTRAST  TECHNIQUE: Multidetector CT imaging of the chest was performed during intravenous contrast administration.  CONTRAST:  80mL OMNIPAQUE IOHEXOL 300 MG/ML  SOLN  COMPARISON:  Chest radiograph of same day.  FINDINGS: No pneumothorax is noted. Mild left pleural effusion is noted. Large right pleural effusion is noted with atelectasis of the right lower lobe. Subsegmental atelectasis of right upper lobe is noted. There is no evidence of thoracic aortic dissection or aneurysm. Visualized portions of pulmonary arteries appear normal. Small sliding-type hiatal hernia is noted. No mediastinal mass or adenopathy is noted. No significant osseous abnormality is noted.  Severe hepatic cirrhosis is noted. Extensive collateral veins are seen in the splenic hilum consistent with portal hypertension. Moderate to severe ascites is noted as well.  IMPRESSION: Mild left pleural effusion.  Large right pleural effusion is noted with atelectasis of the right lower lobe.  Severe hepatic cirrhosis is noted with evidence of portal hypertension and collateral  circulation. Moderate to severe ascites is noted as well.   Electronically Signed   By: James  Green Jr, M.D.   On: 10/18/2014 20:31   Dg Chest Port 1 View  10/18/2014   CLINICAL DATA:  Shortness of breath and congestion. See history of COPD.  EXAM: PORTABLE CHEST - 1 VIEW  COMPARISON:  PA and lateral chest 09/19/2013.  FINDINGS: The patient has a large right pleural effusion and extensive airspace disease. Very small left effusion is noted. The lungs are emphysematous. Heart size is normal.  IMPRESSION: Large right pleural effusion and extensive airspace disease could be due to to pneumonia cannot be definitively characterized. If the abnormality does not clear, short-term follow-up   chest CT with contrast is recommended for further evaluation.   Electronically Signed   By: Inge Rise M.D.   On: 10/18/2014 08:55   US Thoracentesis Asp Pleural Space W/img Guide  10/19/2014   INDICATION: Cirrhosis, coronary artery disease, remote history of breast cancer and stroke, dyspnea, right pleural effusion. Request is made for diagnostic and therapeutic right thoracentesis.  EXAM: ULTRASOUND GUIDED DIAGNOSTIC AND THERAPEUTIC RIGHT THORACENTESIS  COMPARISON:  None.  MEDICATIONS: None  COMPLICATIONS: None immediate  TECHNIQUE: Informed written consent was obtained from the patient's family after a discussion of the risks, benefits and alternatives to treatment. A timeout was performed prior to the initiation of the procedure.  Initial ultrasound scanning demonstrates a large right pleural effusion. The lower chest was prepped and draped in the usual sterile fashion. 1% lidocaine was used for local anesthesia.  An ultrasound image was saved for documentation purposes. A 6 Fr Safe-T-Centesis catheter was introduced. The thoracentesis was performed. The catheter was removed and a dressing was applied. The patient tolerated the procedure well without immediate post procedural complication. The patient was escorted to have  an upright chest radiograph.  FINDINGS: A total of approximately 1.5 liters of turbid, yellow fluid was removed. Requested samples were sent to the laboratory. Due to the fact that this was patient's first thoracentesis and noted soft blood pressures only the above amount of fluid was removed at this time.  IMPRESSION: Successful ultrasound-guided diagnostic and therapeutic right sided thoracentesis yielding 1.5 liters of pleural fluid.  Read by: Rowe Robert, PA-C   Electronically Signed   By: Marybelle Killings M.D.   On: 10/19/2014 13:30    2D Echo: - Left ventricle: The cavity size was normal. Wall thickness was normal. Systolic function was normal. The estimated ejection fraction was in the range of 55% to 60%. Wall motion was normal; there were no regional wall motion abnormalities. Doppler parameters are consistent with abnormal left ventricular relaxation (grade 1 diastolic dysfunction). - Mitral valve: Calcified annulus. There was mild regurgitation. - Left atrium: The atrium was mildly dilated. - Pulmonary arteries: Systolic pressure was mildly increased. PA peak pressure: 36 mm Hg (S).  Admission HPI: [name] is a 81 year old female with PMH of cryptogenic cirrhosis, CAD disease s/p PCI and CVA, HTN, hx of GIB, HL presents to the ED via EMS for worsening cough and chills x1 week. Her husband provided most of the history. He says she was in her usual state of health until about a week ago when she developed increased SOB. About 3 days ago she also developed a cough with scant production of clear sputum. She also has been weak with decreased appetite over this time period. She is on chronic home o2 2L [place] (family unsure why). She does not have any pain and specifically denies any fevers, chills, night sweats, chest pain, nausea, emesis, diarrhea, constipation, dysuria, polyuria.  In the ED, she received NS IV infusion, ceftriaxone 1 g iv once, and duoneb once. She was found in the  ED to have large R pleural effusion.   Attempted to contact PCP Dr Tamsen Roers but office closed on Fridays.  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 Chest X-ray showed large R pleural  effusion and extensive airspace disease. Chest CT describes mild L pleural effusion and large R pleural effusion with atelectasis of the R lower lobe. TTE LVEF 55-60%, grade 1 DD, PA peak pressure 36 mmHg so acute CHF exacerbation unlikely. IR performed thoracentesis 4/9 and drained 1.5L of turbid yellow fluid and was sent for analysis. Does not meet Light's criteria so likely transudate making hepatic hydrothorax more likely. Added spironolactone 100 mg po daily. Her SOB improved following the procedure. We stopped antibiotics (3 days of azithro/ceftriaxone) as CAP unlikely without leukocytosis or fever, and repeat CXR on 4/11 only notes residual R pleural effusion without other consolidation. Pleural fluid cytology is still pending. At discharge, she was sating in the mid 90s on 2 L nasal cannula.  Cryptogenic cirrhosis: [name] had an exploratory laparotomy in 2013 for SBO and observed to have liver cirrhosis. She was then referred to Dr Erskine Emery of GI who called this cryptogenic cirrhosis with hypoalbuminemia and ascites. She has not been seen since as husband says doctor wanted to scope her and they thought this was too invasive. She had u/s guided paracentesis 10/2011. Her LFTs here are AST 18, ALT 10, alkaline phosphatase 55, total bilirubin 0.9, and low hypoalbuminemia at 2.6. CT chest also noted evere hepatic cirrhosis with evidence of portal HTN and collateral circulation with moderate to severe ascites with  ascites observed on exam. We continued her lasix 40 mg daily and added spironolactone 100 mg daily. She was seen by palliative care services during her stay who provided her with a MOST form she was told to bring to PCP.  Asymptomatic bacteruria: UA with moderate leukocyte, negative nitrite, few bacteria but few squam. She has no dysuria, polyuria so this is asymptomatic bacteruria which does not require treatment.  CAD s/p PCI and peri-cath CVA: [name] is s/p PCI in 1999 with peri-cath CVA, previous stress test which showed ischemia in the inferior and inferolateral wall. She has refused cardiac catheterization and has been treated medically though does not want to take statin or ASA. At home she is on toprol-xl 12.5 mg daily, lasix 40 mg daily which were continued. She was last seen by Dr Esmond Plants 10/2012.  HTN: [name] had generally soft so continued her lasix 40 mg daily, toprol-xl 12.5 mg daily and started spironolactone 100 mg daily for her cirrhosis.  Atrial fibrillation: No prior h/o afib in our EMR. CHADS2VASC score is 7 which is 11% stroke risk per year. She does have a history of GI bleeds. She is rate controlled with metoprolol which was continued. Has lovenox 40 mg [place] daily on her med list in EMR but husband says she does not take it. She should discuss anti-coagulation with PCP.  Breast cancer s/p bilateral mastectomy: H/o breast ca (specific diagnosis and staging unknown) about 30-40 years ago (per husband who says it was before they met 19 years ago). She elected for bilateral mastectomy and has had no issues since.  Normocytic Anemia: Presenting hemoglobin 10.8 with MCV 81 is now 9.2. Last hemoglobin in our system 8.8 in 01/2013 (hard to tell baseline as very fluctuating). No anemia panel in our record; had a bleed in her duodenal bulb in 2012 requiring 4 u prbcs. She was continued on ferrous sulfate 352m qd  Discharge Vitals:   BP 120/42 mmHg  Pulse 89  Temp(Src) 97.8 F  (36.6 C) (Oral)  Resp 19  Ht 5' (1.524 m)  Wt 94 lb 2.2 oz (42.7 kg)  BMI 18.38 kg/m2  SpO2 96%  Discharge Physical Exam: Gen: Sitting up in bed eating, pleasant mood, very frail/cachectic  HEENT: Atraumatic, sunken eyes, temporal wasting, EOMI, sclerae anicteric, moist mucous membranes Heart: RRR, no murmurs, rubs, or gallops Lungs: Decreased sound on R lung base, respirations unlabored Abd: Soft, non-tender, distended with fluid wave, +BS,no hepatosplenomegaly, no Murphy sign, small ventral hernia that was reducible, baseline tremor but no asterixis Ext: no LE edema (heels covered with bandages)   Discharge Labs:  Basic Metabolic Panel:  Recent Labs Lab 10/18/14 2329  10/19/14 2251 10/21/14 0400  NA  --   < > 136 134*  K  --   < > 4.1 4.1  CL  --   < > 97 95*  CO2  --   < > 34* 34*  GLUCOSE  --   < > 89 98  BUN  --   < > 16 16  CREATININE  --   < > 0.82 0.92  CALCIUM  --   < > 8.0* 8.0*  MG 1.7  --   --   --   PHOS 3.5  --   --   --   < > = values in this interval not displayed. Liver Function Tests:  Recent Labs Lab 10/18/14 0908 10/19/14 2251  AST 18 14  ALT 10 9  ALKPHOS 55 50  BILITOT 0.9 0.7  PROT 6.4 5.2*  ALBUMIN 2.6* 1.9*    Recent Labs Lab 10/18/14 0908  AMMONIA 36*   CBC:  Recent Labs Lab 10/18/14 0908 10/19/14 0325 10/21/14 0400  WBC 7.2 4.9 5.0  NEUTROABS 5.3  --   --   HGB 10.8* 9.4* 9.2*  HCT 34.4* 29.6* 28.5*  MCV 80.6 80.2 78.5  PLT 121* 109* 128*   Cardiac Enzymes:  Recent Labs Lab 10/18/14 0908  TROPONINI <0.03   Thyroid Function Tests:  Recent Labs Lab 10/18/14 1548  TSH 3.533   Coagulation:  Recent Labs Lab 10/18/14 0908  LABPROT 16.3*  INR 1.30   Urinalysis:  Recent Labs Lab 10/18/14 1013  COLORURINE AMBER*  LABSPEC 1.022  PHURINE 6.0  GLUCOSEU NEGATIVE  HGBUR MODERATE*  BILIRUBINUR SMALL*  KETONESUR NEGATIVE  PROTEINUR NEGATIVE  UROBILINOGEN 0.2  NITRITE NEGATIVE  LEUKOCYTESUR MODERATE*     Misc. Labs: Procalcitonin <0.10 BNP 217 Strep pneumo negative LDH 95 Pleural fluid: yellow, cloudy, 81 WBC, 1% neutrophil, 36 lymph%, 68% monocyte, few mesothelial cells, <3 protein, glucose 120, LDH 31, amylase 9, pH 8.0  Signed: Kelby Aline, MD 10/21/2014, 2:12 PM    Services Ordered on Discharge: [place] Ordered on Discharge: Hospital bed

## 2014-10-21 NOTE — Discharge Summary (Signed)
Name: [name] MRN: [id number] DOB: Nov 26, 1928 81 y.o. PCP: Tamsen Roers, MD  Date of Admission: 10/18/2014  8:03 AM Date of Discharge: 10/21/2014 Attending Physician: Aldine Contes, MD  Discharge Diagnosis:  Principal Problem:   Pleural effusion secondary to cryptogenic cirrhosis Active Problems:   CAD (coronary artery disease)   Cirrhosis of liver   Protein-calorie malnutrition, severe   Cirrhosis, cryptogenic  Discharge Medications:   Medication List    STOP taking these medications        enoxaparin 40 MG/0.4ML injection  Commonly known as:  LOVENOX      TAKE these medications        bisacodyl 10 MG suppository  Commonly known as:  DULCOLAX  Place 1 suppository (10 mg total) rectally daily as needed.     diazepam 5 MG tablet  Commonly known as:  VALIUM  Take 5 mg by mouth as needed.     DSS 100 MG Caps  Take 100 mg by mouth 2 (two) times daily.     ferrous sulfate 325 (65 FE) MG tablet  Take 1 tablet (325 mg total) by mouth 3 (three) times daily with meals.     furosemide 40 MG tablet  Commonly known as:  LASIX  Take 40 mg by mouth daily.     HYDROcodone-acetaminophen 5-325 MG per tablet  Commonly known as:  NORCO  Take 1-2 tablets by mouth every 6 (six) hours as needed for pain. MAXIMUM TOTAL ACETAMINOPHEN DOSE IS 4000 MG PER DAY     metoprolol succinate 12.5 mg Tb24 24 hr tablet  Commonly known as:  TOPROL-XL  Take 0.5 tablets (12.5 mg total) by mouth daily.     montelukast 10 MG tablet  Commonly known as:  SINGULAIR  Take 10 mg by mouth daily as needed (allergies).     nitroGLYCERIN 0.4 MG SL tablet  Commonly known as:  NITROSTAT  Place 0.4 mg under the tongue every 5 (five) minutes as needed. Chest pain     omeprazole 20 MG capsule  Commonly known as:  PRILOSEC  Take 20 mg by mouth daily.     polyethylene glycol packet  Commonly known as:  MIRALAX / GLYCOLAX  Take 17 g by mouth daily as needed.     sennosides-docusate sodium  8.6-50 MG tablet  Commonly known as:  SENOKOT-S  Take 2 tablets by mouth daily.     spironolactone 100 MG tablet  Commonly known as:  ALDACTONE  Take 1 tablet (100 mg total) by mouth daily.     VENTOLIN HFA 108 (90 BASE) MCG/ACT inhaler  Generic drug:  albuterol  Inhale 2 puffs into the lungs every 4 (four) hours as needed for wheezing.        Disposition and follow-up:   Ms.Babette Genora Arp was discharged from [facility] in [place] condition.  At the hospital follow up visit please address:  1.  Breathing status, fluid status, consider atrial fibrillation anti-coagulation  2.  Labs / imaging needed at time of follow-up: none  3.  Pending labs/ test needing follow-up: pleural fluid cytology  Follow-up Appointments: Follow-up Information    Follow up with Tamsen Roers, MD On 10/23/2014.   Specialty:  Family Medicine   Why:  @ 2 pm   Contact information:   [address] [place] [address] [phone]       Follow up with [facility].   Why:  HH-RN/PT/OT ordered (husband only wants Therapist, sports)  Contact information:   [address] [place] [address] [phone]       Follow up with [facility].   Why:  Hospital bed arranged   Contact information:   [address] [place] [address] [phone]       Discharge Instructions: Discharge Instructions    Diet - low sodium heart healthy    Complete by:  As directed      Increase activity slowly    Complete by:  As directed            Consultations: Treatment Team:  Palliative [facility]  Procedures Performed:  Dg Chest 1 View  10/19/2014   CLINICAL DATA:  Status post thoracentesis .  EXAM: CHEST  1 VIEW  COMPARISON:  October 18, 2014.  FINDINGS: Stable cardiomediastinal silhouette. Right pleural effusion is significantly smaller status post thoracentesis. No definite pneumothorax is noted. Minimal left pleural effusion is noted. Degenerative changes  seen involving the right glenohumeral joint.  IMPRESSION: Right pleural effusion is significantly smaller status post thoracentesis. No definite pneumothorax is noted.   Electronically Signed   By: Marijo [place]ception, M.D.   On: 10/19/2014 14:05   Dg Chest 2 View  10/21/2014   CLINICAL DATA:  Pleural effusion and status post right thoracentesis on 10/19/2014  EXAM: CHEST - 2 VIEW  COMPARISON:  10/19/2014  FINDINGS: The heart size and mediastinal contours are within normal limits. A moderate right pleural effusion remains. There is no evidence of pulmonary edema, consolidation, pneumothorax or mass. The visualized skeletal structures are unremarkable.  IMPRESSION: Residual moderate right pleural effusion.   Electronically Signed   By: Aletta Edouard M.D.   On: 10/21/2014 08:23   Ct Chest W Contrast  10/18/2014   CLINICAL DATA:  Right pleural effusion.  EXAM: CT CHEST WITH CONTRAST  TECHNIQUE: Multidetector CT imaging of the chest was performed during intravenous contrast administration.  CONTRAST:  66m OMNIPAQUE IOHEXOL 300 MG/ML  SOLN  COMPARISON:  Chest radiograph of same day.  FINDINGS: No pneumothorax is noted. Mild left pleural effusion is noted. Large right pleural effusion is noted with atelectasis of the right lower lobe. Subsegmental atelectasis of right upper lobe is noted. There is no evidence of thoracic aortic dissection or aneurysm. Visualized portions of pulmonary arteries appear normal. Small sliding-type hiatal hernia is noted. No mediastinal mass or adenopathy is noted. No significant osseous abnormality is noted.  Severe hepatic cirrhosis is noted. Extensive collateral veins are seen in the splenic hilum consistent with portal hypertension. Moderate to severe ascites is noted as well.  IMPRESSION: Mild left pleural effusion.  Large right pleural effusion is noted with atelectasis of the right lower lobe.  Severe hepatic cirrhosis is noted with evidence of portal hypertension and collateral  circulation. Moderate to severe ascites is noted as well.   Electronically Signed   By: JMarijo Conception M.D.   On: 10/18/2014 20:31   Dg Chest Port 1 View  10/18/2014   CLINICAL DATA:  Shortness of breath and congestion. See history of COPD.  EXAM: PORTABLE CHEST - 1 VIEW  COMPARISON:  PA and lateral chest 09/19/2013.  FINDINGS: The patient has a large right pleural effusion and extensive airspace disease. Very small left effusion is noted. The lungs are emphysematous. Heart size is normal.  IMPRESSION: Large right pleural effusion and extensive airspace disease could be due to to pneumonia cannot be definitively characterized. If the abnormality does not clear, short-term follow-up  chest CT with contrast is recommended for further evaluation.   Electronically Signed   By: Inge Rise M.D.   On: 10/18/2014 08:55   US Thoracentesis Asp Pleural Space W/img Guide  10/19/2014   INDICATION: Cirrhosis, coronary artery disease, remote history of breast cancer and stroke, dyspnea, right pleural effusion. Request is made for diagnostic and therapeutic right thoracentesis.  EXAM: ULTRASOUND GUIDED DIAGNOSTIC AND THERAPEUTIC RIGHT THORACENTESIS  COMPARISON:  None.  MEDICATIONS: None  COMPLICATIONS: None immediate  TECHNIQUE: Informed written consent was obtained from the patient's family after a discussion of the risks, benefits and alternatives to treatment. A timeout was performed prior to the initiation of the procedure.  Initial ultrasound scanning demonstrates a large right pleural effusion. The lower chest was prepped and draped in the usual sterile fashion. 1% lidocaine was used for local anesthesia.  An ultrasound image was saved for documentation purposes. A 6 Fr Safe-T-Centesis catheter was introduced. The thoracentesis was performed. The catheter was removed and a dressing was applied. The patient tolerated the procedure well without immediate post procedural complication. The patient was escorted to have  an upright chest radiograph.  FINDINGS: A total of approximately 1.5 liters of turbid, yellow fluid was removed. Requested samples were sent to the laboratory. Due to the fact that this was patient's first thoracentesis and noted soft blood pressures only the above amount of fluid was removed at this time.  IMPRESSION: Successful ultrasound-guided diagnostic and therapeutic right sided thoracentesis yielding 1.5 liters of pleural fluid.  Read by: Rowe Robert, PA-C   Electronically Signed   By: Marybelle Killings M.D.   On: 10/19/2014 13:30    2D Echo: - Left ventricle: The cavity size was normal. Wall thickness was normal. Systolic function was normal. The estimated ejection fraction was in the range of 55% to 60%. Wall motion was normal; there were no regional wall motion abnormalities. Doppler parameters are consistent with abnormal left ventricular relaxation (grade 1 diastolic dysfunction). - Mitral valve: Calcified annulus. There was mild regurgitation. - Left atrium: The atrium was mildly dilated. - Pulmonary arteries: Systolic pressure was mildly increased. PA peak pressure: 36 mm Hg (S).  Admission HPI: Ms. Salberg is a 81 year old female with PMH of cryptogenic cirrhosis, CAD disease s/p PCI and CVA, HTN, hx of GIB, HL presents to the ED via EMS for worsening cough and chills x1 week. Her husband provided most of the history. He says she was in her usual state of health until about a week ago when she developed increased SOB. About 3 days ago she also developed a cough with scant production of clear sputum. She also has been weak with decreased appetite over this time period. She is on chronic home o2 2L [place] (family unsure why). She does not have any pain and specifically denies any fevers, chills, night sweats, chest pain, nausea, emesis, diarrhea, constipation, dysuria, polyuria.  In the ED, she received NS IV infusion, ceftriaxone 1 g iv once, and duoneb once. She was found in the  ED to have large R pleural effusion.   Attempted to contact PCP Dr Tamsen Roers but office closed on Fridays.  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 Chest X-ray showed large R pleural  effusion and extensive airspace disease. Chest CT describes mild L pleural effusion and large R pleural effusion with atelectasis of the R lower lobe. TTE LVEF 55-60%, grade 1 DD, PA peak pressure 36 mmHg so acute CHF exacerbation unlikely. IR performed thoracentesis 4/9 and drained 1.5L of turbid yellow fluid and was sent for analysis. Does not meet Light's criteria so likely transudate making hepatic hydrothorax more likely. Added spironolactone 100 mg po daily. Her SOB improved following the procedure. We stopped antibiotics (3 days of azithro/ceftriaxone) as CAP unlikely without leukocytosis or fever, and repeat CXR on 4/11 only notes residual R pleural effusion without other consolidation. Pleural fluid cytology is still pending. At discharge, she was sating in the mid 90s on 2 L nasal cannula.  Cryptogenic cirrhosis: Ms Goeden had an exploratory laparotomy in 2013 for SBO and observed to have liver cirrhosis. She was then referred to Dr Erskine Emery of GI who called this cryptogenic cirrhosis with hypoalbuminemia and ascites. She has not been seen since as husband says doctor wanted to scope her and they thought this was too invasive. She had u/s guided paracentesis 10/2011. Her LFTs here are AST 18, ALT 10, alkaline phosphatase 55, total bilirubin 0.9, and low hypoalbuminemia at 2.6. CT chest also noted evere hepatic cirrhosis with evidence of portal HTN and collateral circulation with moderate to severe ascites with  ascites observed on exam. We continued her lasix 40 mg daily and added spironolactone 100 mg daily. She was seen by palliative care services during her stay who provided her with a MOST form she was told to bring to PCP.  Asymptomatic bacteruria: UA with moderate leukocyte, negative nitrite, few bacteria but few squam. She has no dysuria, polyuria so this is asymptomatic bacteruria which does not require treatment.  CAD s/p PCI and peri-cath CVA: Ms Hole is s/p PCI in 1999 with peri-cath CVA, previous stress test which showed ischemia in the inferior and inferolateral wall. She has refused cardiac catheterization and has been treated medically though does not want to take statin or ASA. At home she is on toprol-xl 12.5 mg daily, lasix 40 mg daily which were continued. She was last seen by Dr Esmond Plants 10/2012.  HTN: Ms Mahrt had generally soft so continued her lasix 40 mg daily, toprol-xl 12.5 mg daily and started spironolactone 100 mg daily for her cirrhosis.  Atrial fibrillation: No prior h/o afib in our EMR. CHADS2VASC score is 7 which is 11% stroke risk per year. She does have a history of GI bleeds. She is rate controlled with metoprolol which was continued. Has lovenox 40 mg [place] daily on her med list in EMR but husband says she does not take it. She should discuss anti-coagulation with PCP.  Breast cancer s/p bilateral mastectomy: H/o breast ca (specific diagnosis and staging unknown) about 30-40 years ago (per husband who says it was before they met 19 years ago). She elected for bilateral mastectomy and has had no issues since.  Normocytic Anemia: Presenting hemoglobin 10.8 with MCV 81 is now 9.2. Last hemoglobin in our system 8.8 in 01/2013 (hard to tell baseline as very fluctuating). No anemia panel in our record; had a bleed in her duodenal bulb in 2012 requiring 4 u prbcs. She was continued on ferrous sulfate 357m qd  Discharge Vitals:   BP 120/42 mmHg  Pulse 89  Temp(Src) 97.8 F  (36.6 C) (Oral)  Resp 19  Ht 5' (1.524 m)  Wt 94 lb 2.2 oz (42.7 kg)  BMI 18.38 kg/m2  SpO2 96%  Discharge Physical Exam: Gen: Sitting up in bed eating, pleasant mood, very frail/cachectic  HEENT: Atraumatic, sunken eyes, temporal wasting, EOMI, sclerae anicteric, moist mucous membranes Heart: RRR, no murmurs, rubs, or gallops Lungs: Decreased sound on R lung base, respirations unlabored Abd: Soft, non-tender, distended with fluid wave, +BS,no hepatosplenomegaly, no Murphy sign, small ventral hernia that was reducible, baseline tremor but no asterixis Ext: no LE edema (heels covered with bandages)   Discharge Labs:  Basic Metabolic Panel:  Recent Labs Lab 10/18/14 2329  10/19/14 2251 10/21/14 0400  NA  --   < > 136 134*  K  --   < > 4.1 4.1  CL  --   < > 97 95*  CO2  --   < > 34* 34*  GLUCOSE  --   < > 89 98  BUN  --   < > 16 16  CREATININE  --   < > 0.82 0.92  CALCIUM  --   < > 8.0* 8.0*  MG 1.7  --   --   --   PHOS 3.5  --   --   --   < > = values in this interval not displayed. Liver Function Tests:  Recent Labs Lab 10/18/14 0908 10/19/14 2251  AST 18 14  ALT 10 9  ALKPHOS 55 50  BILITOT 0.9 0.7  PROT 6.4 5.2*  ALBUMIN 2.6* 1.9*    Recent Labs Lab 10/18/14 0908  AMMONIA 36*   CBC:  Recent Labs Lab 10/18/14 0908 10/19/14 0325 10/21/14 0400  WBC 7.2 4.9 5.0  NEUTROABS 5.3  --   --   HGB 10.8* 9.4* 9.2*  HCT 34.4* 29.6* 28.5*  MCV 80.6 80.2 78.5  PLT 121* 109* 128*   Cardiac Enzymes:  Recent Labs Lab 10/18/14 0908  TROPONINI <0.03   Thyroid Function Tests:  Recent Labs Lab 10/18/14 1548  TSH 3.533   Coagulation:  Recent Labs Lab 10/18/14 0908  LABPROT 16.3*  INR 1.30   Urinalysis:  Recent Labs Lab 10/18/14 1013  COLORURINE AMBER*  LABSPEC 1.022  PHURINE 6.0  GLUCOSEU NEGATIVE  HGBUR MODERATE*  BILIRUBINUR SMALL*  KETONESUR NEGATIVE  PROTEINUR NEGATIVE  UROBILINOGEN 0.2  NITRITE NEGATIVE  LEUKOCYTESUR MODERATE*     Misc. Labs: Procalcitonin <0.10 BNP 217 Strep pneumo negative LDH 95 Pleural fluid: yellow, cloudy, 81 WBC, 1% neutrophil, 36 lymph%, 68% monocyte, few mesothelial cells, <3 protein, glucose 120, LDH 31, amylase 9, pH 8.0  Signed: Kelby Aline, MD 10/21/2014, 2:12 PM    Services Ordered on Discharge: [place] Ordered on Discharge: Hospital bed

## 2014-10-21 NOTE — Progress Notes (Signed)
Pt/family given discharge instructions, medication lists, follow up appointments, and when to call the doctor.  Pt/family verbalizes understanding. Kadajah Kjos McClintock, RN   

## 2014-10-21 NOTE — Progress Notes (Signed)
Patient seen and examined. Case d/w residents in detail. I agree with findings and plan as documented in Dr. Bebe Shaggy note.  Patient is much improved today. Still with mild SOB but mental status has improved. Still with decreased breath sounds in R LL but otherwise CTA and no LE edema. No asterixes.   Patient is s/p thoracentesis for large R sided effusion which was transudative. No evidence of underlying PNA. Would d/c abx and monitor.C/w lasix and spironolactone for diuresis. Will f/u palliative consult.   Likely d/c in AM if remains stable

## 2014-10-21 NOTE — Care Management Note (Signed)
    Page 1 of 2   10/21/2014     11:58:25 AM CARE MANAGEMENT NOTE 10/21/2014  Patient:  [name], [name]   Account Number:  [id number]  Date Initiated:  10/21/2014  Documentation initiated by:  Marvetta Gibbons  Subjective/Objective Assessment:   Pt admitted with pl. effusion     Action/Plan:   PTA pt lived at home with spouse- PT/OT evals   Anticipated DC Date:  10/22/2014   Anticipated DC Plan:  [place]  CM consult      [place]   Choice offered to / List presented to:  C-3 Spouse   DME arranged  HOSPITAL BED      DME agency  [place] arranged  HH-1 RN  [place].   Status of service:  Completed, signed off Medicare Important Message given?  YES (If response is "NO", the following Medicare IM given date [place] will be blank) Date Medicare IM given:  10/21/2014 Medicare IM given by:  Marvetta Gibbons Date Additional Medicare IM given:   Additional Medicare IM given by:    Discharge Disposition:  [place]  Per UR Regulation:  Reviewed for med. necessity/level of care/duration of stay  If discussed at [place] of Stay Meetings, dates discussed:    Comments:  10/21/14- 1145- Marvetta Gibbons RN, BSN [phone] Referral for [facility] and DME needs- spoke with pt and husband at bedside- per conversation pt's spouse states that he does not have a preference for [facility] agency for services- he thinks they have used [facility] in past and is fine with using them again- he states that he only wants [place] and does not want any therapy- he also wants a hospital bed but will need to move some furniture- call made to [facility] with [facility] regarding hospital bed needs and order- he is to f/u on this- referral for HH-RN called to [place] with [facility]- [facility] services will begin within 24-48 hr. post discharge.

## 2014-10-23 LAB — BODY FLUID CULTURE: CULTURE: NO GROWTH

## 2014-11-10 DEATH — deceased

## 2016-03-13 IMAGING — DX DG CHEST 1V
1 series · 1 of 1 positions shown · non-contrast
Comparison: October 18, 2014.

CLINICAL DATA: Status post thoracentesis .

EXAM:
CHEST  1 VIEW

[chest ap]
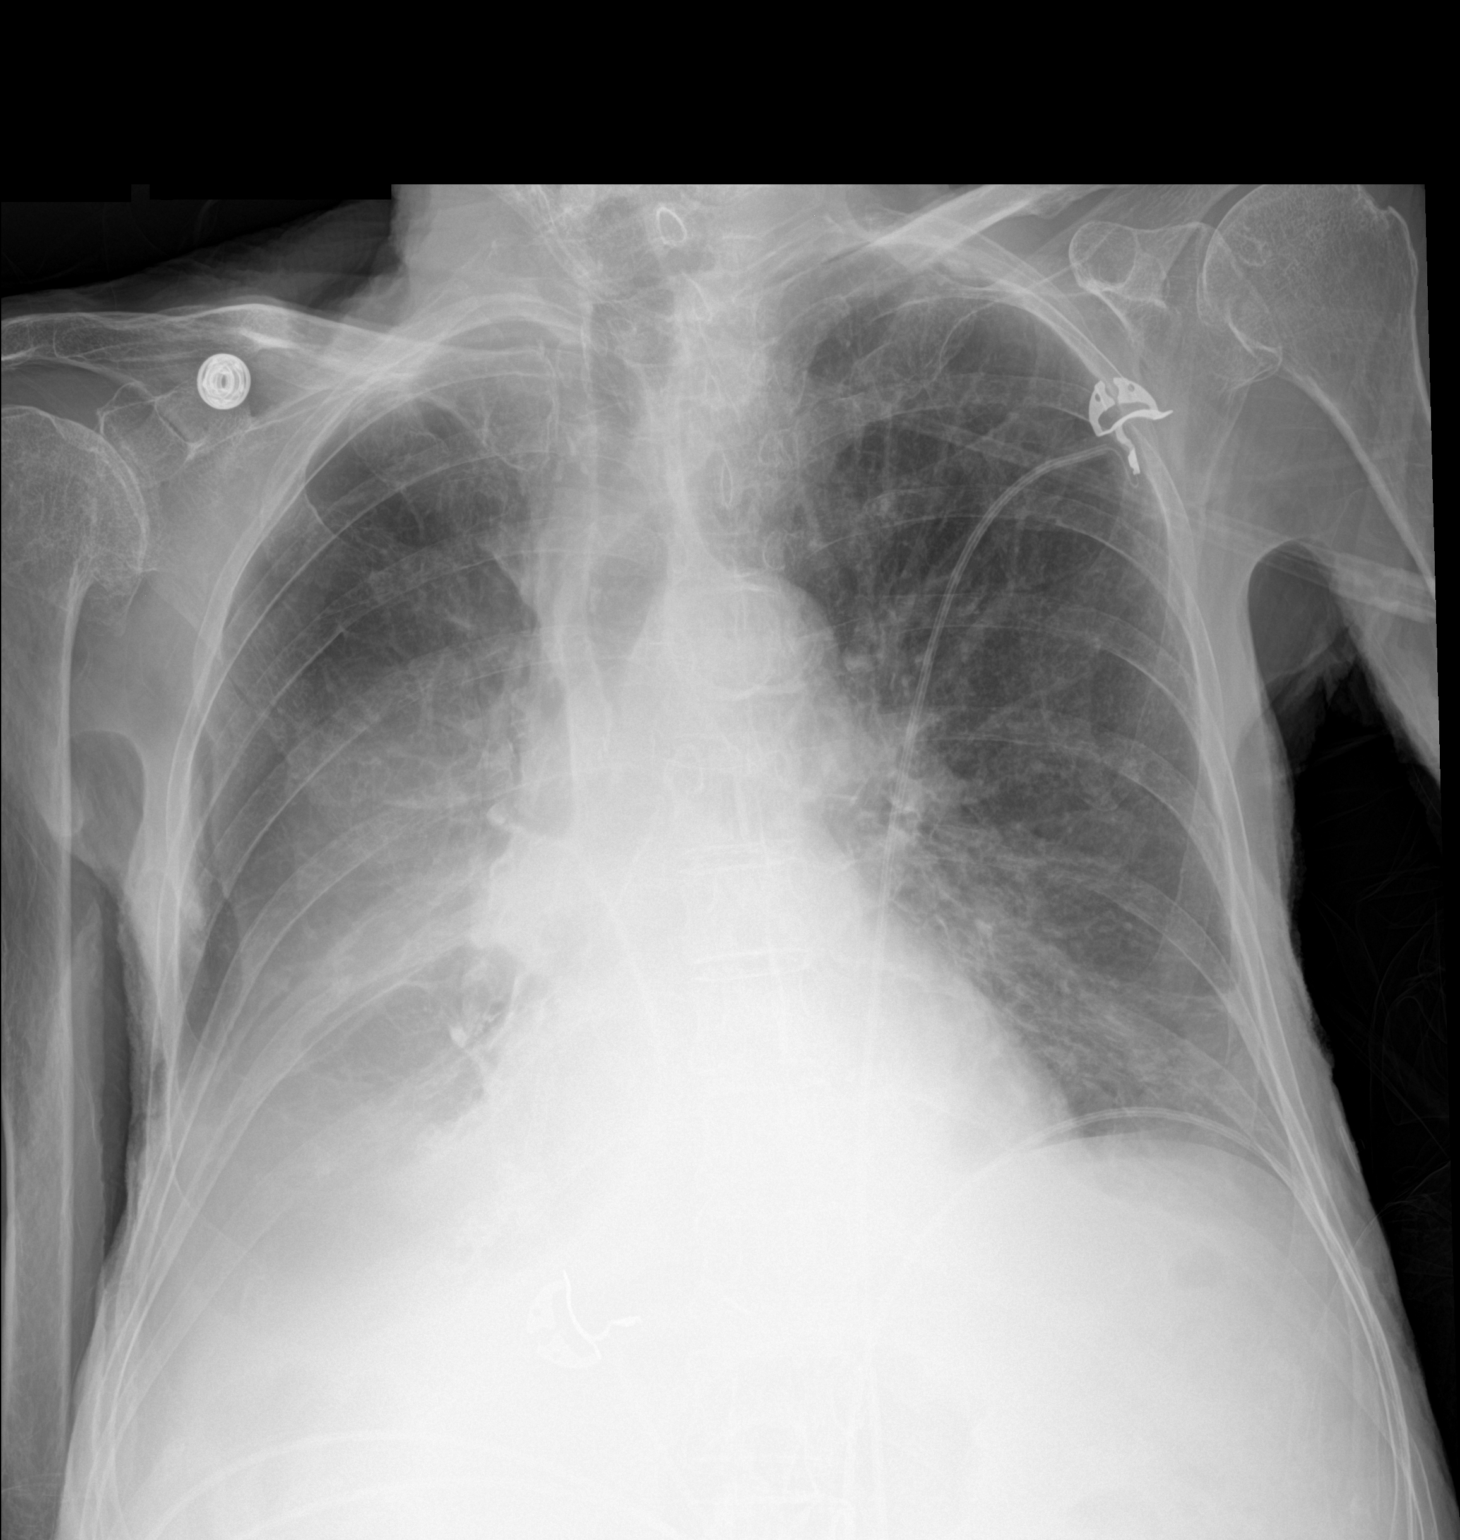

[1 of 1 positions shown; findings below may reference images not displayed]

FINDINGS: Stable cardiomediastinal silhouette. Right pleural effusion is
significantly smaller status post thoracentesis. No definite
pneumothorax is noted. Minimal left pleural effusion is noted.
Degenerative changes seen involving the right glenohumeral joint.
IMPRESSION: Right pleural effusion is significantly smaller status post
thoracentesis. No definite pneumothorax is noted.

## 2016-03-15 IMAGING — DX DG CHEST 2V
2 series · 2 of 2 positions shown · non-contrast
Comparison: 10/19/2014

CLINICAL DATA: Pleural effusion and status post right thoracentesis
on 10/19/2014

EXAM:
CHEST - 2 VIEW

[chest ap]
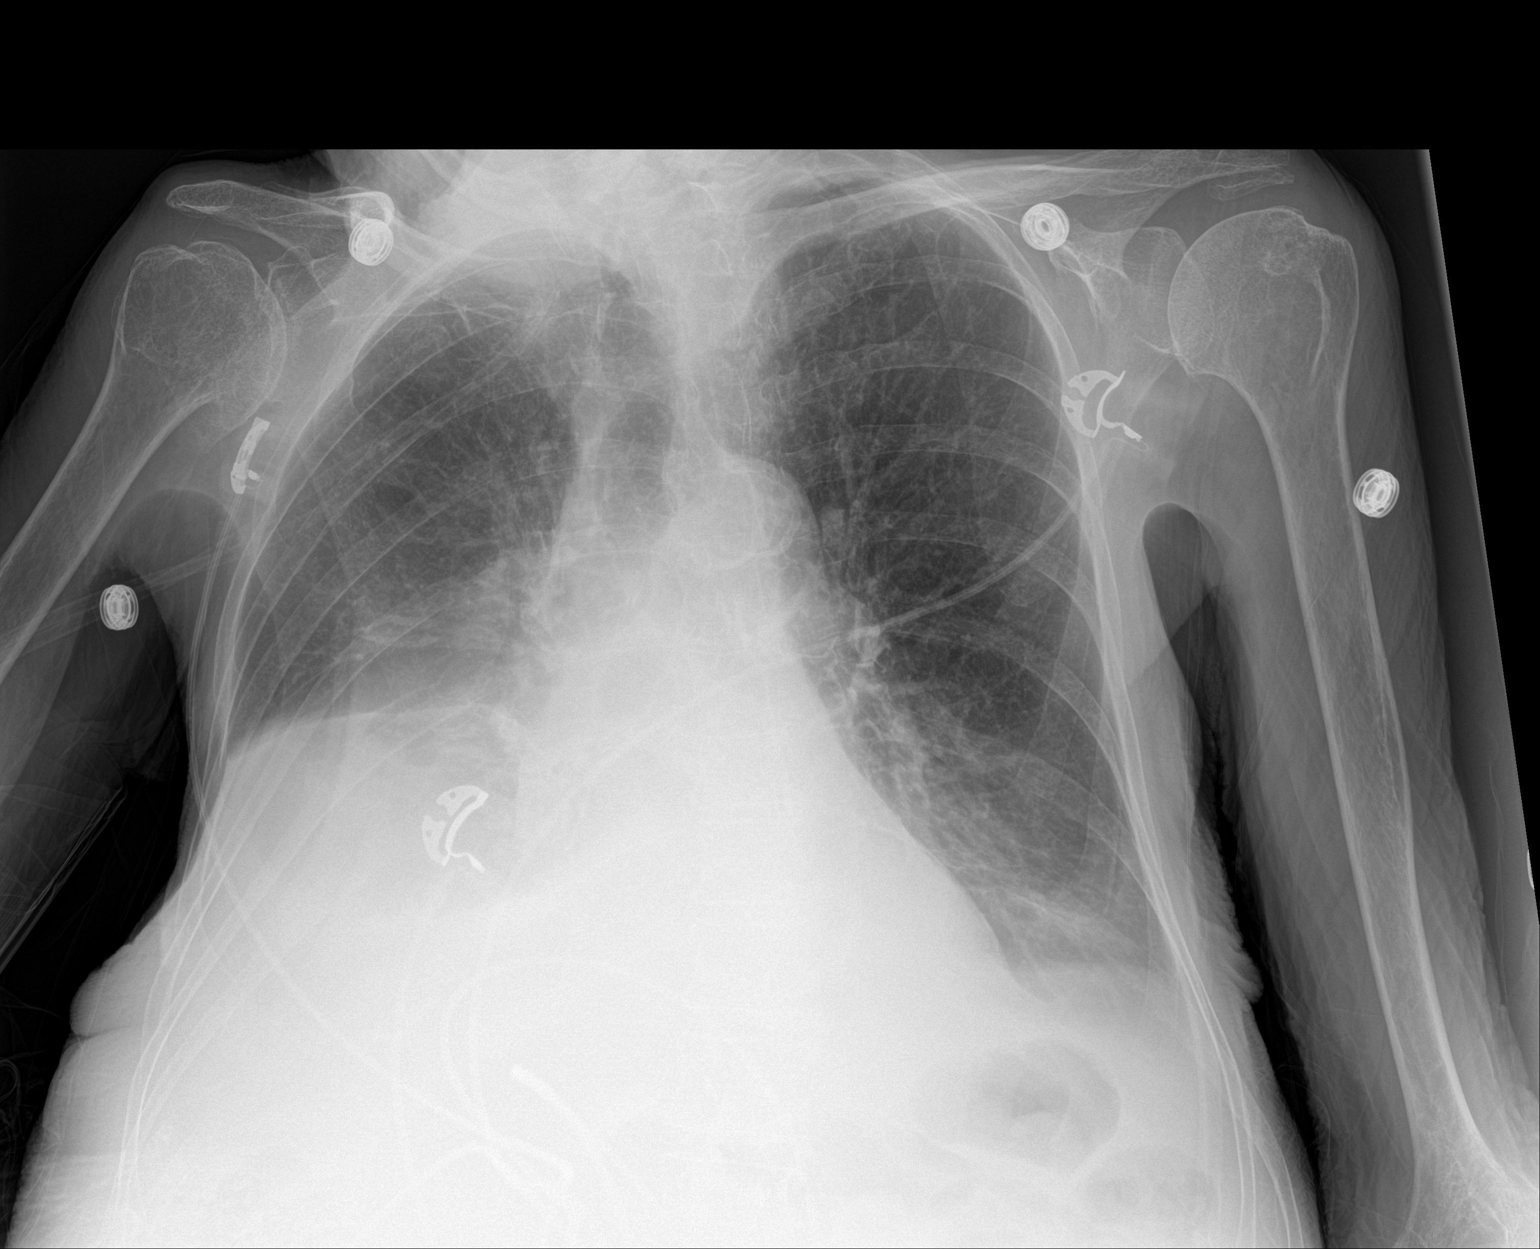

[chest lat]
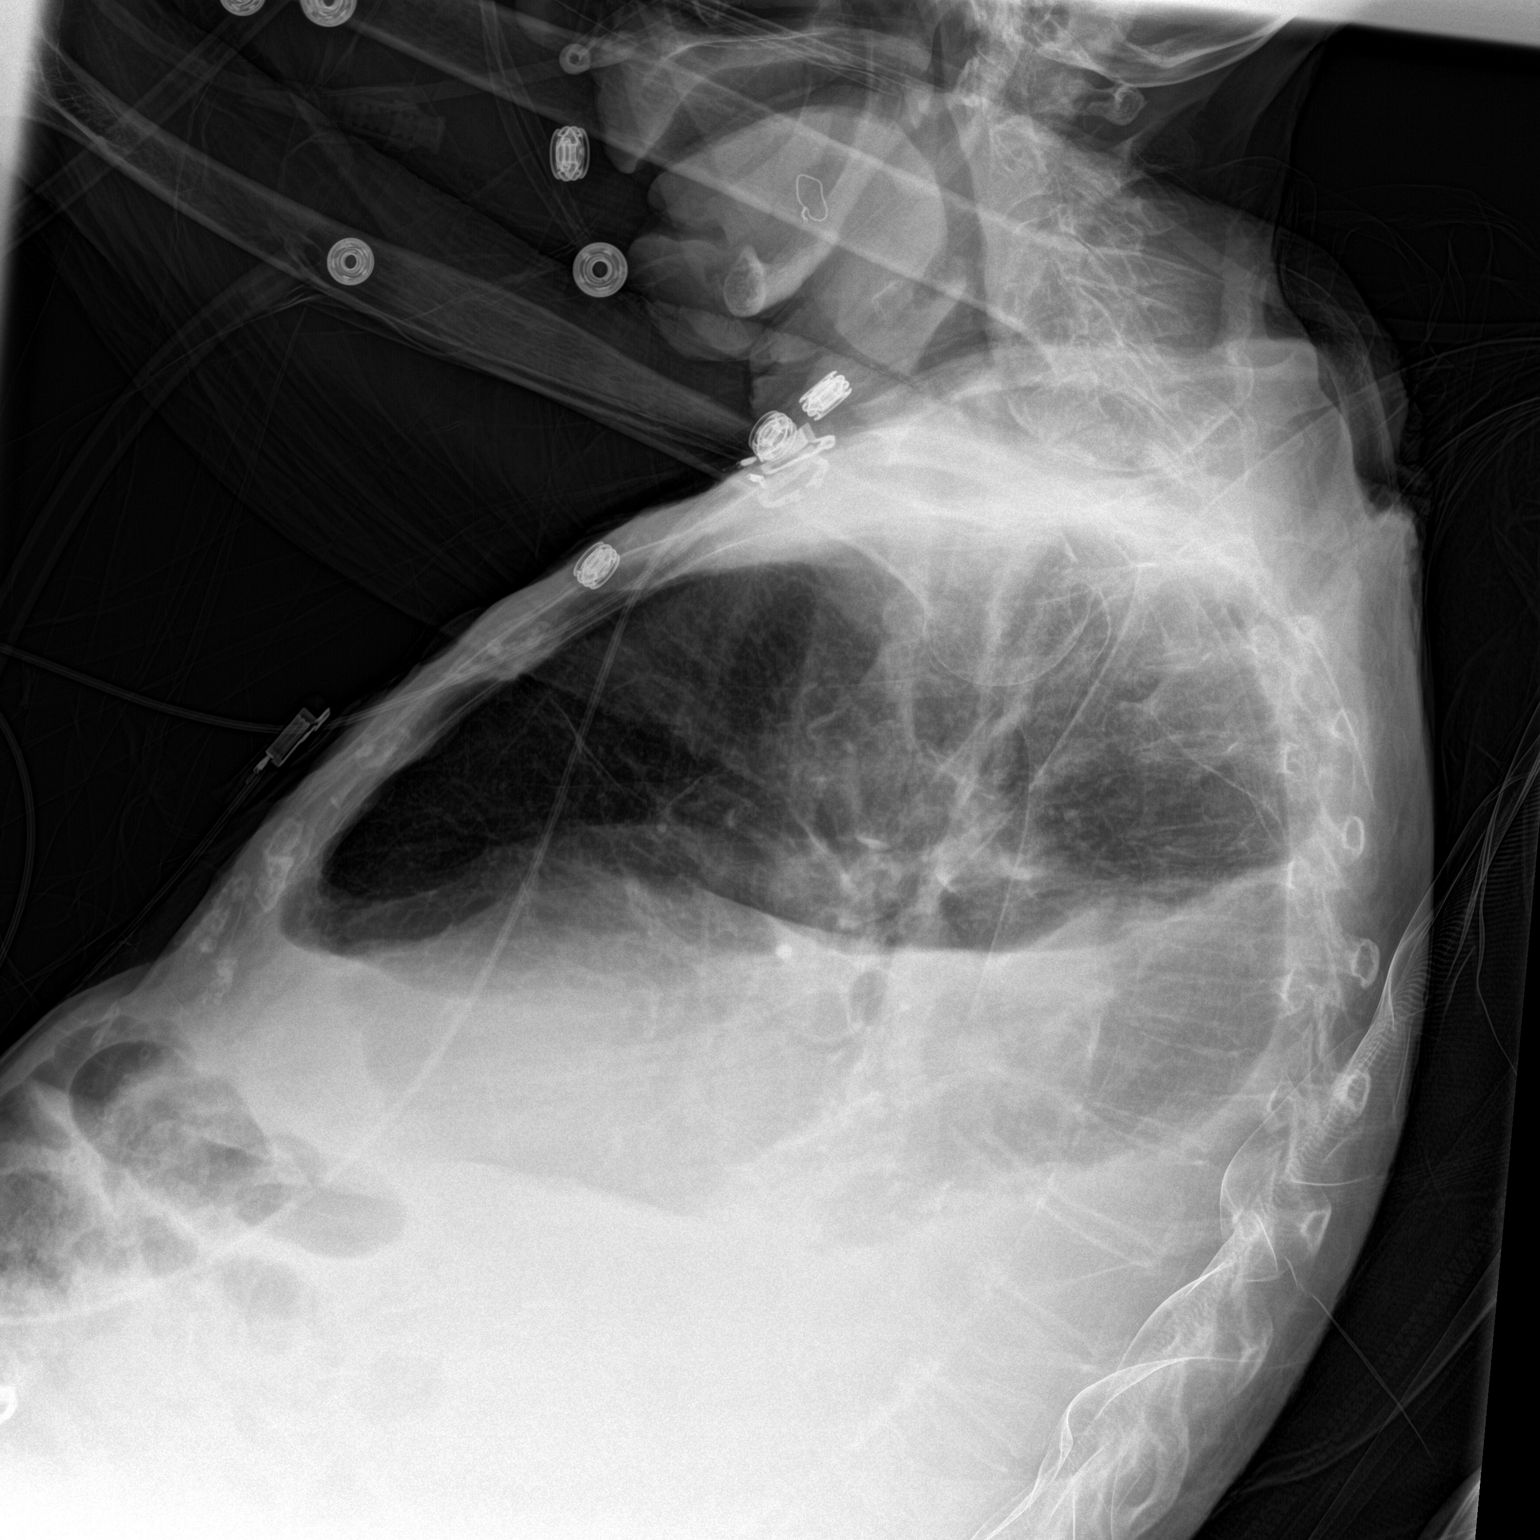

[2 of 2 positions shown; findings below may reference images not displayed]

FINDINGS: The heart size and mediastinal contours are within normal limits. A
moderate right pleural effusion remains. There is no evidence of
pulmonary edema, consolidation, pneumothorax or mass. The visualized
skeletal structures are unremarkable.
IMPRESSION: Residual moderate right pleural effusion.
